# Patient Record
Sex: Female | Born: 1955 | Race: White | Hispanic: No | Marital: Married | Smoking: Never smoker
Health system: Southern US, Community
[De-identification: ages and names within clinical notes are randomized; demographics above are authoritative.]

## PROBLEM LIST (undated history)

## (undated) DIAGNOSIS — N951 Menopausal and female climacteric states: Secondary | ICD-10-CM

## (undated) DIAGNOSIS — J310 Chronic rhinitis: Secondary | ICD-10-CM

## (undated) DIAGNOSIS — G43909 Migraine, unspecified, not intractable, without status migrainosus: Secondary | ICD-10-CM

## (undated) DIAGNOSIS — T7840XA Allergy, unspecified, initial encounter: Secondary | ICD-10-CM

## (undated) DIAGNOSIS — E039 Hypothyroidism, unspecified: Secondary | ICD-10-CM

## (undated) DIAGNOSIS — F419 Anxiety disorder, unspecified: Secondary | ICD-10-CM

## (undated) DIAGNOSIS — Z0000 Encounter for general adult medical examination without abnormal findings: Secondary | ICD-10-CM

## (undated) DIAGNOSIS — M8580 Other specified disorders of bone density and structure, unspecified site: Secondary | ICD-10-CM

## (undated) DIAGNOSIS — R102 Pelvic and perineal pain: Secondary | ICD-10-CM

## (undated) DIAGNOSIS — M1990 Unspecified osteoarthritis, unspecified site: Secondary | ICD-10-CM

## (undated) DIAGNOSIS — B009 Herpesviral infection, unspecified: Secondary | ICD-10-CM

## (undated) DIAGNOSIS — G8929 Other chronic pain: Secondary | ICD-10-CM

## (undated) HISTORY — DX: Allergy, unspecified, initial encounter: T78.40XA

## (undated) HISTORY — DX: Pelvic and perineal pain: R10.2

## (undated) HISTORY — DX: Migraine, unspecified, not intractable, without status migrainosus: G43.909

## (undated) HISTORY — DX: Hypothyroidism, unspecified: E03.9

## (undated) HISTORY — DX: Other specified disorders of bone density and structure, unspecified site: M85.80

## (undated) HISTORY — DX: Herpesviral infection, unspecified: B00.9

## (undated) HISTORY — DX: Unspecified osteoarthritis, unspecified site: M19.90

## (undated) HISTORY — DX: Other chronic pain: G89.29

## (undated) HISTORY — DX: Menopausal and female climacteric states: N95.1

## (undated) HISTORY — DX: Anxiety disorder, unspecified: F41.9

## (undated) HISTORY — DX: Encounter for general adult medical examination without abnormal findings: Z00.00

## (undated) HISTORY — PX: PELVIC LAPAROSCOPY: SHX162

## (undated) HISTORY — DX: Chronic rhinitis: J31.0

---

## 1993-02-11 DIAGNOSIS — G8929 Other chronic pain: Secondary | ICD-10-CM

## 1993-02-11 HISTORY — DX: Other chronic pain: G89.29

## 1998-01-25 ENCOUNTER — Encounter: Payer: Self-pay | Admitting: Obstetrics and Gynecology

## 1998-01-25 ENCOUNTER — Ambulatory Visit (HOSPITAL_COMMUNITY): Admission: RE | Admit: 1998-01-25 | Discharge: 1998-01-25 | Payer: Self-pay | Admitting: Obstetrics and Gynecology

## 1999-03-06 ENCOUNTER — Encounter: Payer: Self-pay | Admitting: Obstetrics and Gynecology

## 1999-03-06 ENCOUNTER — Ambulatory Visit (HOSPITAL_COMMUNITY): Admission: RE | Admit: 1999-03-06 | Discharge: 1999-03-06 | Payer: Self-pay | Admitting: Obstetrics and Gynecology

## 1999-04-12 ENCOUNTER — Other Ambulatory Visit: Admission: RE | Admit: 1999-04-12 | Discharge: 1999-04-12 | Payer: Self-pay | Admitting: Obstetrics and Gynecology

## 2000-07-23 ENCOUNTER — Encounter: Payer: Self-pay | Admitting: Obstetrics and Gynecology

## 2000-07-23 ENCOUNTER — Ambulatory Visit (HOSPITAL_COMMUNITY): Admission: RE | Admit: 2000-07-23 | Discharge: 2000-07-23 | Payer: Self-pay | Admitting: Obstetrics and Gynecology

## 2000-11-10 ENCOUNTER — Encounter: Admission: RE | Admit: 2000-11-10 | Discharge: 2000-11-10 | Payer: Self-pay | Admitting: Family Medicine

## 2000-11-10 ENCOUNTER — Encounter: Payer: Self-pay | Admitting: Family Medicine

## 2001-10-23 ENCOUNTER — Encounter: Payer: Self-pay | Admitting: Obstetrics and Gynecology

## 2001-10-23 ENCOUNTER — Ambulatory Visit (HOSPITAL_COMMUNITY): Admission: RE | Admit: 2001-10-23 | Discharge: 2001-10-23 | Payer: Self-pay | Admitting: Obstetrics and Gynecology

## 2002-11-16 ENCOUNTER — Encounter: Payer: Self-pay | Admitting: Obstetrics and Gynecology

## 2002-11-16 ENCOUNTER — Ambulatory Visit (HOSPITAL_COMMUNITY): Admission: RE | Admit: 2002-11-16 | Discharge: 2002-11-16 | Payer: Self-pay | Admitting: Obstetrics and Gynecology

## 2003-02-02 ENCOUNTER — Other Ambulatory Visit: Admission: RE | Admit: 2003-02-02 | Discharge: 2003-02-02 | Payer: Self-pay | Admitting: Obstetrics and Gynecology

## 2004-02-15 ENCOUNTER — Other Ambulatory Visit: Admission: RE | Admit: 2004-02-15 | Discharge: 2004-02-15 | Payer: Self-pay | Admitting: Obstetrics and Gynecology

## 2004-07-12 ENCOUNTER — Ambulatory Visit: Payer: Self-pay | Admitting: Internal Medicine

## 2004-07-16 ENCOUNTER — Ambulatory Visit: Payer: Self-pay | Admitting: Cardiology

## 2004-07-17 ENCOUNTER — Ambulatory Visit (HOSPITAL_COMMUNITY): Admission: RE | Admit: 2004-07-17 | Discharge: 2004-07-17 | Payer: Self-pay | Admitting: Obstetrics and Gynecology

## 2004-12-18 ENCOUNTER — Ambulatory Visit: Payer: Self-pay | Admitting: Internal Medicine

## 2004-12-25 ENCOUNTER — Ambulatory Visit: Payer: Self-pay | Admitting: Gastroenterology

## 2005-02-26 ENCOUNTER — Ambulatory Visit: Payer: Self-pay | Admitting: Gastroenterology

## 2005-03-11 ENCOUNTER — Other Ambulatory Visit: Admission: RE | Admit: 2005-03-11 | Discharge: 2005-03-11 | Payer: Self-pay | Admitting: Obstetrics and Gynecology

## 2005-03-29 ENCOUNTER — Ambulatory Visit: Payer: Self-pay | Admitting: Internal Medicine

## 2005-06-19 ENCOUNTER — Ambulatory Visit: Payer: Self-pay | Admitting: Internal Medicine

## 2005-08-01 ENCOUNTER — Ambulatory Visit (HOSPITAL_COMMUNITY): Admission: RE | Admit: 2005-08-01 | Discharge: 2005-08-01 | Payer: Self-pay | Admitting: Obstetrics and Gynecology

## 2006-03-24 ENCOUNTER — Other Ambulatory Visit: Admission: RE | Admit: 2006-03-24 | Discharge: 2006-03-24 | Payer: Self-pay | Admitting: Obstetrics and Gynecology

## 2006-08-05 ENCOUNTER — Ambulatory Visit (HOSPITAL_COMMUNITY): Admission: RE | Admit: 2006-08-05 | Discharge: 2006-08-05 | Payer: Self-pay | Admitting: Obstetrics and Gynecology

## 2006-10-02 ENCOUNTER — Ambulatory Visit: Payer: Self-pay | Admitting: Internal Medicine

## 2007-04-13 ENCOUNTER — Other Ambulatory Visit: Admission: RE | Admit: 2007-04-13 | Discharge: 2007-04-13 | Payer: Self-pay | Admitting: Obstetrics and Gynecology

## 2007-05-21 ENCOUNTER — Encounter: Payer: Self-pay | Admitting: Internal Medicine

## 2007-08-10 ENCOUNTER — Ambulatory Visit (HOSPITAL_COMMUNITY): Admission: RE | Admit: 2007-08-10 | Discharge: 2007-08-10 | Payer: Self-pay | Admitting: Obstetrics and Gynecology

## 2007-11-20 ENCOUNTER — Telehealth (INDEPENDENT_AMBULATORY_CARE_PROVIDER_SITE_OTHER): Payer: Self-pay | Admitting: *Deleted

## 2007-11-20 ENCOUNTER — Ambulatory Visit: Payer: Self-pay | Admitting: Internal Medicine

## 2007-11-20 DIAGNOSIS — J310 Chronic rhinitis: Secondary | ICD-10-CM

## 2008-05-20 ENCOUNTER — Other Ambulatory Visit: Admission: RE | Admit: 2008-05-20 | Discharge: 2008-05-20 | Payer: Self-pay | Admitting: Obstetrics and Gynecology

## 2008-05-20 ENCOUNTER — Encounter: Payer: Self-pay | Admitting: Obstetrics and Gynecology

## 2008-05-20 ENCOUNTER — Ambulatory Visit: Payer: Self-pay | Admitting: Obstetrics and Gynecology

## 2008-08-26 ENCOUNTER — Ambulatory Visit (HOSPITAL_COMMUNITY): Admission: RE | Admit: 2008-08-26 | Discharge: 2008-08-26 | Payer: Self-pay | Admitting: Obstetrics and Gynecology

## 2008-11-08 ENCOUNTER — Encounter: Payer: Self-pay | Admitting: Internal Medicine

## 2009-02-24 ENCOUNTER — Ambulatory Visit: Payer: Self-pay | Admitting: Internal Medicine

## 2009-05-23 ENCOUNTER — Other Ambulatory Visit: Admission: RE | Admit: 2009-05-23 | Discharge: 2009-05-23 | Payer: Self-pay | Admitting: Obstetrics and Gynecology

## 2009-05-23 ENCOUNTER — Ambulatory Visit: Payer: Self-pay | Admitting: Obstetrics and Gynecology

## 2009-06-22 ENCOUNTER — Ambulatory Visit: Payer: Self-pay | Admitting: Obstetrics and Gynecology

## 2009-08-29 ENCOUNTER — Ambulatory Visit (HOSPITAL_COMMUNITY): Admission: RE | Admit: 2009-08-29 | Discharge: 2009-08-29 | Payer: Self-pay | Admitting: Obstetrics and Gynecology

## 2009-11-30 ENCOUNTER — Encounter: Payer: Self-pay | Admitting: Gastroenterology

## 2009-11-30 ENCOUNTER — Ambulatory Visit: Payer: Self-pay | Admitting: Internal Medicine

## 2009-12-01 ENCOUNTER — Telehealth (INDEPENDENT_AMBULATORY_CARE_PROVIDER_SITE_OTHER): Payer: Self-pay | Admitting: *Deleted

## 2009-12-29 ENCOUNTER — Encounter (INDEPENDENT_AMBULATORY_CARE_PROVIDER_SITE_OTHER): Payer: Self-pay | Admitting: *Deleted

## 2010-01-01 ENCOUNTER — Ambulatory Visit: Payer: Self-pay | Admitting: Gastroenterology

## 2010-01-15 ENCOUNTER — Ambulatory Visit: Payer: Self-pay | Admitting: Gastroenterology

## 2010-03-13 NOTE — Assessment & Plan Note (Signed)
Summary: Primary svc/ f/u chronic rhinitis   Primary Provider/Referring Provider:  Sherene Sires  CC:  Chronic rhinitis- improved. Marland Kitchen  History of Present Illness: 27 yowf never smoker with h/o chronic rhinitis and hypothyroidism.  November 20, 2007 cough x 2  weeks  in setting of recent acute onset URI  resolved.  February 24, 2009 Acute visit.  Pt c/o right side facial pressure and HA x 1 month. worse when supine  She has had some PND also and some dry cough.  She has noticed ringing in her right ear.  Year round daily nose running ever since arrived in [place] with allergy shots bad tol no help.Zpak should turn any disclored mucus to clear, if not call back Prednisone 10 4 each am x 2days, 2x2days, 1x2days and stop For pain, tramadol 50mg  1-2 every 4 hour  November 30, 2009 ov cc increase nasal congestion on nasonex 2 puffs each am with contingency to use afrin x 5 days first and add a second dose of nasonex.  working well until ran out.  Pt denies any significant sore throat, dysphagia, itching, sneezing,      fever, chills, sweats, unintended wt loss, pleuritic or exertional cp, hempoptysis, change in activity tolerance  orthopnea pnd or leg swelling. although nasonex worked well for congestion, still freq watery rhinitis    Current Medications (verified): 1)  Nasonex 50 Mcg/act Susp (Mometasone Furoate) .... 2 Sprays 1-2 Times Daily As Needed 2)  Synthroid 100 Mcg Tabs (Levothyroxine Sodium) .... Once Daily 3)  Valtrex 500 Mg Tabs (Valacyclovir Hcl) .... As Needed 4)  Mucinex Dm 30-600 Mg Xr12h-Tab (Dextromethorphan-Guaifenesin) .Marland Kitchen.. 1-2 Every 12 Hours As Needed 5)  Femhrt Low Dose 0.5-2.5 Mg-Mcg Tabs (Norethindrone-Eth Estradiol) .Marland Kitchen.. 1 Once Daily 6)  Advil Cold/sinus 30-200 Mg Tabs (Pseudoephedrine-Ibuprofen) .... As Directed As Needed 7)  Tramadol Hcl 50 Mg  Tabs (Tramadol Hcl) .... One To Two By Mouth Every 4-6 Hours If Needed For Cough- Ran Out 8)  Advil 200 Mg Tabs (Ibuprofen) .Marland Kitchen.. 1 Two  Times A Day As Needed 9)  Multivitamins  Tabs (Multiple Vitamin) .Marland Kitchen.. 1 Once Daily 10)  Calcium-Vitamin D 600-200 Mg-Unit Tabs (Calcium-Vitamin D) .Marland Kitchen.. 1 Once Daily  Allergies (verified): 1)  ! Lodine 2)  ! Truman Hayward  Past History:  Past Medical History: HYPOTHYROIDIMS CHRONIC RHINITIS    - Chronic rhinitis flyer (# 4th) given November 20, 2007  HEALTH MAINTENANCE ................................................Marland KitchenWert    -Td 09/1999 and February 24, 2009    - Pneumovax February 24, 2009     -  Colonsocopy referral  November 30, 2009  GYN CARE....................................................................Marland KitchenGotsagen  Vital Signs:  Patient profile:   55 year old female Weight:      130 pounds BMI:     23.11 O2 Sat:      100 % on Room air Temp:     98.4 degrees F oral Pulse rate:   79 / minute BP sitting:   114 / 78  (left arm)  Vitals Entered By: Vernie Murders (November 30, 2009 11:37 AM)  O2 Flow:  Room air  Physical Exam  Additional Exam:  wt 132 November 20, 2007  > 131 February 25, 2009 > 130 December 01, 2009  Ambulatory healthy appearing in no acute distress Afeb with normal vital signs HEENT: nl dentition,, and orophanx. Nl external ear canals without cough reflex/ mod nonspecific turbinate edema Neck without JVD/Nodes/TM/ no tenderness Lungs clear to A and P bilaterally without cough  on insp or exp maneuvers RRR no s3 or murmur or increase in P2 Abd soft and benign with nl excursion in the supine position. No bruits or organomegaly Ext warm without calf tenderness, cyanosis clubbing or edema      Impression & Recommendations:  Problem # 1:  CHRONIC RHINITIS (ICD-472.0)  Reviewed approp rx with Nasonex and afrin  try 1st gen H1 for drippy nose  Orders: Est. Patient Level III (29562)  Problem # 2:  PHYSICAL EXAMINATION (ICD-V70.0)  Needs cpx this year plus screening colonoscopy  Discussed in detail all the  indications, usual  risks and alternatives   relative to the benefits with patient who agrees to proceed with colonoscopy  Medications Added to Medication List This Visit: 1)  Tramadol Hcl 50 Mg Tabs (Tramadol hcl) .... One to two by mouth every 4-6 hours if needed for cough- ran out 2)  Advil 200 Mg Tabs (Ibuprofen) .Marland Kitchen.. 1 two times a day as needed 3)  Multivitamins Tabs (Multiple vitamin) .Marland Kitchen.. 1 once daily 4)  Calcium-vitamin D 600-200 Mg-unit Tabs (Calcium-vitamin d) .Marland Kitchen.. 1 once daily  Other Orders: Admin 1st Vaccine (13086) Flu Vaccine 55yrs + [phone]) Gastroenterology Referral (GI)  Patient Instructions: 1)  Return to office for CPX one month Gottsagen's visit to complete your physical  2)  Chortrimeton 4mg  one every 6 hours for drippy nose 3)  See Patient Care Coordinator before leaving for  colonoscopy 4)  Call with strength of the imitrex that you found effective for your migraines and we will refill it         Flu Vaccine Consent Questions     Do you have a history of severe allergic reactions to this vaccine? no    Any prior history of allergic reactions to egg and/or gelatin? no    Do you have a sensitivity to the preservative Thimersol? no    Do you have a past history of Guillan-Barre Syndrome? no    Do you currently have an acute febrile illness? no    Have you ever had a severe reaction to latex? no    Vaccine information given and explained to patient? yes    Are you currently pregnant? no    Lot Number:[id number]   Exp Date:08/11/2010   Site Given  Left Deltoid IMlu1   Mindy Silva  November 30, 2009 12:07 PM

## 2010-03-13 NOTE — Assessment & Plan Note (Signed)
Summary: Primary svc/ acute flare of chronic rhinitis   Primary Provider/Referring Provider:  Sherene Sires  CC:  Acute visit.  Pt c/o right side facial pressure and HA x 1 month.  She has had some PND also and some dry cough.  She has noticed ringing in her right ear.Marland Kitchen  History of Present Illness: 10 yowf never smoker with h/o chronic rhinitis and hypothyroidism.  November 20, 2007 cough x 2  weeks  in setting of recent acute onset URI  resolved.  February 24, 2009 Acute visit.  Pt c/o right side facial pressure and HA x 1 month. worse when supine  She has had some PND also and some dry cough.  She has noticed ringing in her right ear.  Year round daily nose running ever since arrived in [place] with allergy shots bad tol no help.   Pt denies any significant sob/ sore throat, dysphagia, itching, sneezing,  fever, chills, sweats, unintended wt loss, pleuritic or exertional cp, hempoptysis, change in activity tolerance  orthopnea pnd or leg swelling.  Pt also denies any obvious fluctuation in symptoms with weather or environmental change or other alleviating or aggravating factors.     Following cr instruction sheet except forgot about advil cold and sinus until this am, not better yet. no nausea or viz cos or earache.    Current Medications (verified): 1)  Nasonex 50 Mcg/act Susp (Mometasone Furoate) .... 2 Sprays 1-2 Times Daily As Needed 2)  Synthroid 100 Mcg Tabs (Levothyroxine Sodium) .... Once Daily 3)  Valtrex 500 Mg Tabs (Valacyclovir Hcl) .... As Needed 4)  Mucinex Dm 30-600 Mg Xr12h-Tab (Dextromethorphan-Guaifenesin) .Marland Kitchen.. 1-2 Every 12 Hours As Needed 5)  Femhrt Low Dose 0.5-2.5 Mg-Mcg Tabs (Norethindrone-Eth Estradiol) .Marland Kitchen.. 1 Once Daily 6)  Advil Cold/sinus 30-200 Mg Tabs (Pseudoephedrine-Ibuprofen) .... As Directed As Needed  Allergies (verified): 1)  ! Lodine 2)  ! Truman Hayward  Past History:  Past Medical History: HYPOTHYROIDIMS CHRONIC RHINITIS    - Chronic rhinitis flyer (# 4th)  given November 20, 2007  HEALTH MAINTENANCE ...............................................Marland KitchenWert    -Td 09/1999 and February 24, 2009    - Pneumovax February 24, 2009  GYN CARE....................................................................Marland KitchenGotsagen  Vital Signs:  Patient profile:   55 year old female Height:      63 inches Weight:      131.13 pounds BMI:     23.31 O2 Sat:      99 % on Room air Temp:     97.9 degrees F oral Pulse rate:   78 / minute BP sitting:   90 / 60  (left arm)  Vitals Entered By: Vernie Murders (February 24, 2009 9:37 AM)  O2 Flow:  Room air  Physical Exam  Additional Exam:  wt 132 November 20, 2007  > 131 February 25, 2009  Ambulatory healthy appearing in no acute distress Afeb with normal vital signs HEENT: nl dentition,, and orophanx. Nl external ear canals without cough reflex/ mod nonspecific turbinate edema Neck without JVD/Nodes/TM/ no tenderness Lungs clear to A and P bilaterally without cough on insp or exp maneuvers RRR no s3 or murmur or increase in P2 Abd soft and benign with nl excursion in the supine position. No bruits or organomegaly Ext warm without calf tenderness, cyanosis clubbing or edema s     Impression & Recommendations:  Problem # 1:  CHRONIC RHINITIS (ICD-472.0)  acute flare with possible sinusitis.  already has chronic rhinitis flyer just needs to follow all contingency  instructions including use of as needed advil cold and sinus.  See instructions for specific recommendations    Sinus ct next step if not improving  Orders: Est. Patient Level III ([address])  Medications Added to Medication List This Visit: 1)  Advil Cold/sinus 30-200 Mg Tabs (Pseudoephedrine-ibuprofen) .... As directed as needed 2)  Zithromax Z-pak 250 Mg Tabs (Azithromycin) .... Take as directed 3)  Prednisone 10 Mg Tabs (Prednisone) .... 4 each am x 2days, 2x2days, 1x2days and stop 4)  Tramadol Hcl 50 Mg Tabs (Tramadol hcl) .... One to two by mouth  every 4-6 hours if needed for cough  Other Orders: TD Toxoids IM 7 YR + (60454) Admin 1st Vaccine (09811) Pneumococcal Vaccine (91478) Admin of Any Addtl Vaccine (29562)  Patient Instructions: 1)  I emphasized that nasal steroids have no immediate benefit in terms of improving symptoms.  To help them reached the target tissue, the patient should use Afrin two puffs every 12 hours applied one min before using the nasal steroids.  Afrin should be stopped after no more than 5 days.  If the symptoms worsen, Afrin can be restarted after 5 days off of therapy to prevent rebound congestion from overuse of Afrin.  I also emphasized that in no way are nasal steroids a concern in terms of "addiction". 2)   Follow the flyer 3)  Zpak should turn any disclored mucus to clear, if not call back 4)  Prednisone 10 4 each am x 2days, 2x2days, 1x2days and stop 5)  For pain, tramadol 50mg  1-2 every 4 hours 6)  If not improving call [phone] ask for [facility] to schedule CT Prescriptions: TRAMADOL HCL 50 MG  TABS (TRAMADOL HCL) One to two by mouth every 4-6 hours if needed for cough  #40 x 0   Entered and Authorized by:   Nyoka Cowden MD   Signed by:   Nyoka Cowden MD on 02/24/2009   Method used:   Electron[facility] to        [facility]  [address] [phone][facility] (retail)       [address] [place], [place]  [address]       Ph: [phone] or [phone]       Fax: [phone]   RxID:   [id number] PREDNISONE 10 MG  TABS (PREDNISONE) 4 each am x 2days, 2x2days, 1x2days and stop  #14 x 0   Entered and Authorized by:   Nyoka Cowden MD   Signed by:   Nyoka Cowden MD on 02/24/2009   Method used:   [facility] to        [facility]  [address] [phone][facility] (retail)       [address] [place], [place]  [address]       Ph: [phone] or [phone]       Fax: [phone]   RxID:   [id number] ZITHROMAX Z-PAK 250 MG TABS (AZITHROMYCIN) take as directed  #1 x 0   Entered and Authorized by:    Nyoka Cowden MD   Signed by:   Nyoka Cowden MD on 02/24/2009   Method used:   [facility]        [facility]  [address] [phone][facility]       [address]  [place], [place]  [address]       Ph: [phone] or [phone]       Fax: [phone]   RxID:   [id number]    Immunizations Administered:  Tetanus Vaccine:    Vaccine Type: Td    Site: right deltoid    Mfr: Sanofi Pasteur    Dose: 0.5 ml    Given by: Vernie Murders    Exp. Date: 05/25/2010    Lot #: [id number]    VIS given: 12/30/06 version given February 24, 2009.  Pneumonia Vaccine:    Vaccine Type: Pneumovax    Site: left deltoid    Mfr: Merck    Dose: 0.5 ml    Route: IM    Given by: Vernie Murders    Exp. Date: 03/09/2010    Lot #: [id number]    VIS given: 09/09/95 version given February 24, 2009.

## 2010-03-13 NOTE — Letter (Signed)
Summary: [facility] Instructions  [facility]  [address] [place], [place] [address]   Phone: [phone]  Fax: [phone]       Heather Chambers    [birth year]    MRN: [id number]        Procedure Day [name]:  Heather Chambers  01/15/10     Arrival Time:  9:00AM     Procedure Time:  10:00AM     Location of Procedure:                    Heather Chambers _  [name] (4th Floor)                      PREPARATION FOR COLONOSCOPY WITH MOVIPREP   Starting 5 days prior to your procedure 01/10/10 do not eat nuts, seeds, popcorn, corn, beans, peas,  salads, or any raw vegetables.  Do not take any fiber supplements (e.g. Metamucil, Citrucel, and Benefiber).  THE DAY BEFORE YOUR PROCEDURE         DATE: 01/14/10   DAY: SUNDAY  1.  Drink clear liquids the entire day-NO SOLID FOOD  2.  Do not drink anything colored red or purple.  Avoid juices with pulp.  No orange juice.  3.  Drink at least 64 oz. (8 glasses) of fluid/clear liquids during the day to prevent dehydration and help the prep work efficiently.  CLEAR LIQUIDS INCLUDE: Water Jello Ice Popsicles Tea (sugar ok, no milk/cream) Powdered fruit flavored drinks Coffee (sugar ok, no milk/cream) Gatorade Juice: apple, white grape, white cranberry  Lemonade Clear bullion, consomm, broth Carbonated beverages (any kind) Strained chicken noodle soup Hard Candy                             4.  In the morning, mix first dose of MoviPrep solution:    Empty 1 Pouch A and 1 Pouch B into the disposable container    Add lukewarm drinking water to the top line of the container. Mix to dissolve    Refrigerate (mixed solution should be used within 24 hrs)  5.  Begin drinking the prep at 5:00 p.m. The MoviPrep container is divided by 4 marks.   Every 15 minutes drink the solution down to the next mark (approximately 8 oz) until the full liter is complete.   6.  Follow completed prep with 16 oz of clear liquid of your choice (Nothing red  or purple).  Continue to drink clear liquids until bedtime.  7.  Before going to bed, mix second dose of MoviPrep solution:    Empty 1 Pouch A and 1 Pouch B into the disposable container    Add lukewarm drinking water to the top line of the container. Mix to dissolve    Refrigerate  THE DAY OF YOUR PROCEDURE      DATE: 01/15/10  DAY: MONDAY  Beginning at 5:00AM (5 hours before procedure):         1. Every 15 minutes, drink the solution down to the next mark (approx 8 oz) until the full liter is complete.  2. Follow completed prep with 16 oz. of clear liquid of your choice.    3. You may drink clear liquids until 8:00AM (2 HOURS BEFORE PROCEDURE).   MEDICATION INSTRUCTIONS  Unless otherwise instructed, you should take regular prescription medications with a small sip of water   as early as possible the morning of  your procedure.           OTHER INSTRUCTIONS  You will need a responsible adult at least 53 years of age to accompany you and drive you home.   This person must remain in the waiting room during your procedure.  Wear loose fitting clothing that is easily removed.  Leave jewelry and other valuables at home.  However, you may wish to bring a book to read or  an iPod/MP3 player to listen to music as you wait for your procedure to start.  Remove all body piercing jewelry and leave at home.  Total time from sign-in until discharge is approximately 2-3 hours.  You should go home directly after your procedure and rest.  You can resume normal activities the  day after your procedure.  The day of your procedure you should not:   Drive   Make legal decisions   Operate machinery   Drink alcohol   Return to work  You will receive specific instructions about eating, activities and medications before you leave.    The above instructions have been reviewed and explained to me by   [name]  January 01, 2010 1:12 PM   I fully understand and can  verbalize these instructions _____________________________ Date _________

## 2010-03-13 NOTE — Miscellaneous (Signed)
Summary: previsit prep/rm  Clinical Lists Changes  Medications: Added new medication of MOVIPREP 100 GM  SOLR (PEG-KCL-NACL-NASULF-NA ASC-C) As per prep instructions. - Signed Rx of MOVIPREP 100 GM  SOLR (PEG-KCL-NACL-NASULF-NA ASC-C) As per prep instructions.;  #1 x 0;  Signed;  Entered by: [name];  Authorized by: Rachael Fee MD;  Method used: [facility]  [phone] [address] [place], [place], [place]  [address], Ph: [phone] or [phone], Fax: [phone] Observations: Added new observation of ALLERGY REV: Done (01/01/2010 12:56)    Prescriptions: MOVIPREP 100 GM  SOLR (PEG-KCL-NACL-NASULF-NA ASC-C) As per prep instructions.  #1 x 0   Entered by:   [name]   Authorized by:   Rachael Fee MD   Signed by:   [name] on 01/01/2010   Method used:   [facility]        [facility]  [address] [phone][facility] (retail)       [address]       [place], [place]  [address]       Ph: [phone] or [phone]       Fax: [phone]   RxID:   [id number]

## 2010-03-13 NOTE — Letter (Signed)
Summary: Pre Visit Letter Revised  [facility]  [address] [place], [place] [address]   Phone: [phone]  Fax: [phone]        11/30/2009 MRN: [id number]  Heather Chambers [address] [place], [place]  [address]             Procedure Date:  12-5 at 10am   Welcome to the Gastroenterology Division at [facility].    You are scheduled to see a nurse for your pre-procedure visit on 01-01-10 at 1pm on the 3rd floor at [facility], [address]  We ask that you try to arrive at our office 15 minutes prior to your appointment time to allow for check-in.  Please take a minute to review the attached form.  If you answer "Yes" to one or more of the questions on the first page, we ask that you call the person listed at your earliest opportunity.  If you answer "No" to all of the questions, please complete the rest of the form and bring it to your appointment.    Your nurse visit will consist of discussing your medical and surgical history, your immediate family medical history, and your medications.   If you are unable to list all of your medications on the form, please bring the medication bottles to your appointment and we will list them.  We will need to be aware of both prescribed and over the counter drugs.  We will need to know exact dosage information as well.    Please be prepared to read and sign documents such as consent forms, a financial agreement, and acknowledgement forms.  If necessary, and with your consent, a friend or relative is welcome to sit-in on the nurse visit with you.  Please bring your insurance card so that we may make a copy of it.  If your insurance requires a referral to see a specialist, please bring your referral form from your primary care physician.  No co-pay is required for this nurse visit.     If you cannot keep your appointment, please call [phone] to cancel or reschedule prior to your appointment date.  This allows  [place] the opportunity to schedule an appointment for another patient in need of care.    Thank you for choosing [facility] for your medical needs.  We appreciate the opportunity to care for you.  Please visit [place] at our website  to learn more about our practice.  Sincerely, The Gastroenterology Division

## 2010-03-13 NOTE — Procedures (Signed)
Summary: Colonoscopy  Patient: [name] Note: All result statuses are Final unless otherwise noted.  Tests: (1) Colonoscopy (COL)   COL Colonoscopy           DONE     [facility]     [address]     [place], [place]  [address]           COLONOSCOPY PROCEDURE REPORT           PATIENT:  Heather Chambers, Heather Chambers  MR#:  [id number]     BIRTHDATE:  1955/04/08, 54 yrs. old  GENDER:  female     ENDOSCOPIST:  Rachael Fee, MD     REF. BY:  Charlaine Dalton. Sherene Sires, M.D.     PROCEDURE DATE:  01/15/2010     PROCEDURE:  Diagnostic Colonoscopy     ASA CLASS:  Class II     INDICATIONS:  Routine Risk Screening     MEDICATIONS:   Fentanyl 75 mcg IV, Versed 10 mg IV           DESCRIPTION OF PROCEDURE:   After the risks benefits and     alternatives of the procedure were thoroughly explained, informed     consent was obtained.  Digital rectal exam was performed and     revealed no rectal masses.   The LB [id number]-[id number] endoscope     was introduced through the anus and advanced to the cecum, which     was identified by both the appendix and ileocecal valve, without     limitations.  The quality of the prep was good, using MoviPrep.     The instrument was then slowly withdrawn as the colon was fully     examined.     <<PROCEDUREIMAGES>>           FINDINGS:  A normal appearing cecum, ileocecal valve, and     appendiceal orifice were identified. The ascending, hepatic     flexure, transverse, splenic flexure, descending, sigmoid colon,     and rectum appeared unremarkable (see image1, image2, and image3).     Retroflexed views in the rectum revealed no abnormalities.    The     scope was then withdrawn from the patient and the procedure     completed.           COMPLICATIONS:  None           ENDOSCOPIC IMPRESSION:     1) Normal colon     2) No polyps or cancers           RECOMMENDATIONS:     1) You should continue to follow colorectal cancer screening     guidelines for "routine risk"  patients with a repeat colonoscopy     in 10 years. There is no need for FOBT (stool) testing for at     least 5 years.           REPEAT EXAM:  10 years           ______________________________     Rachael Fee, MD           n.     eSIGNED:   Rachael Fee at 01/15/2010 09:58 AM           Christy Gentles, [phone]  Note: An exclamation mark (!) indicates a result that was not dispersed into the flowsheet. Document Creation Date: 01/15/2010 9:58 AM _______________________________________________________________________  (1) Order result status: Final Collection or observation date-time:  01/15/2010 09:54 Requested date-time:  Receipt date-time:  Reported date-time:  Referring Physician:   Ordering Physician: Rob Bunting [phone]) Specimen Source:  Source: Launa Grill Order Number: [id number] Lab site:   Appended Document: Colonoscopy    Clinical Lists Changes  Observations: Added new observation of COLONNXTDUE: 01/2020 (01/15/2010 15:06)

## 2010-03-13 NOTE — Progress Notes (Signed)
Summary: ? imitrex rx  Phone Note Call from Patient Call back at Work Phone [phone]   Caller: Patient Call For: wert Reason for Call: Refill Medication Summary of Call: Patient saw Dr. Sherene Sires yesterday and she was supposed to call with strength of imitrex so we could refill it.  The strength is 100mg --[facility]. Initial call taken by: Lehman Prom,  December 01, 2009 8:42 AM  Follow-up for Phone Call        Hey TP, MW advised this pt to call with strength of imitrex that works best for her.  She calls stating she needs 100 mg tabs- can you please advise how this is to be taken and a quantity to give? Thanks! Follow-up by: Vernie Murders,  December 01, 2009 9:15 AM  Additional Follow-up for Phone Call Additional follow up Details #1::        Imitrex 100mg  at onset of headache as needed  #10  3 refills  Additional Follow-up by: Rubye Oaks NP,  December 01, 2009 9:40 AM    Additional Follow-up for Phone Call Additional follow up Details #2::    Spoke with pt and notified that rx was sent to pharm.   Follow-up by: Vernie Murders,  December 01, 2009 9:49 AM  New/Updated Medications: IMITREX 100 MG TABS (SUMATRIPTAN SUCCINATE) 1 at onset of head ache as needed Prescriptions: IMITREX 100 MG TABS (SUMATRIPTAN SUCCINATE) 1 at onset of head ache as needed  #10 x 3   Entered by:   Vernie Murders   Authorized by:   Nyoka Cowden MD   Signed by:   Vernie Murders on 12/01/2009   Method used:   [facility]  [address] [phone]       [address] [place], [place]  [address]       Ph: [phone] or [phone]       Fax: [phone]   RxID:   [id number]

## 2010-05-31 ENCOUNTER — Encounter: Payer: Self-pay | Admitting: Obstetrics and Gynecology

## 2010-06-26 NOTE — Assessment & Plan Note (Signed)
[facility]                             PULMONARY OFFICE NOTE   Heather Chambers, Heather Chambers                          MRN:          [id number]  DATE:10/02/2006                            DOB:          Jan 16, 1956    HISTORY OF PRESENT ILLNESS:  The patient is a pleasant 55 year old white  female patient of Dr. Thurston Hole who has a known history of seasonal  rhinitis that presents for an annual office visit.  The patient was last  seen in the office in May of 2007.  She reports that she has been doing  fairly well.  She continues to have intermittent episodes in which she  gets congestion or postnasal drip symptoms.  She is maintained on a  regimen of Astelin nasal spray two puffs twice daily and Nasonex two  puffs in a.m. with Zyrtec on an as needed basis.  The patient denies any  purulent sputum, fever, chest pain, orthopnea, PND, wheezing, or cough.   PAST MEDICAL HISTORY:  Past medical history is reviewed.   CURRENT MEDICATIONS:  Current medications reviewed.   PHYSICAL EXAMINATION:  The  patient is a pleasant female in no acute  distress.  She is afebrile with stable vital signs.  O2 saturation is 97% on room  air.  HEENT:  Nasal mucosa is slightly pale.  Nontender sinuses.  Conjunctivae  noninjected.  Posterior pharynx clear.  NECK:  Supple without cervical adenopathy.  No JVD.  LUNGS:  Lung sounds are clear.  CARDIAC:  Regular rate.  ABDOMEN:  Soft and nontender.  EXTREMITIES:  Warm without any edema.   IMPRESSION:  Chronic rhinitis.  The patient is recommended to continue  on Nasonex and Astelin nasal spray.  She is to add in saline nasal spray  as needed.  May use Zyrtec as needed.  The patient will return back to  the office with Dr. Sherene Sires in six months or sooner if needed.      Rubye Oaks, NP  Electronically Signed      Charlaine Dalton. Sherene Sires, MD, [facility]  Electronically Signed   TP/MedQ  DD: 10/03/2006  DT: 10/04/2006  Job #: [id number]

## 2010-07-12 ENCOUNTER — Other Ambulatory Visit (HOSPITAL_COMMUNITY)
Admission: RE | Admit: 2010-07-12 | Discharge: 2010-07-12 | Disposition: A | Discharge status: Home / Self Care | Payer: BC Managed Care – PPO | Source: Ambulatory Visit | Attending: Obstetrics and Gynecology | Admitting: Obstetrics and Gynecology

## 2010-07-12 ENCOUNTER — Other Ambulatory Visit: Payer: Self-pay | Admitting: Obstetrics and Gynecology

## 2010-07-12 ENCOUNTER — Encounter (INDEPENDENT_AMBULATORY_CARE_PROVIDER_SITE_OTHER): Payer: BC Managed Care – PPO | Admitting: Obstetrics and Gynecology

## 2010-07-12 DIAGNOSIS — Z13220 Encounter for screening for lipoid disorders: Secondary | ICD-10-CM

## 2010-07-12 DIAGNOSIS — E039 Hypothyroidism, unspecified: Secondary | ICD-10-CM

## 2010-07-12 DIAGNOSIS — Z124 Encounter for screening for malignant neoplasm of cervix: Secondary | ICD-10-CM | POA: Insufficient documentation

## 2010-07-12 DIAGNOSIS — Z01419 Encounter for gynecological examination (general) (routine) without abnormal findings: Secondary | ICD-10-CM

## 2010-07-12 DIAGNOSIS — R823 Hemoglobinuria: Secondary | ICD-10-CM

## 2010-08-28 ENCOUNTER — Other Ambulatory Visit (INDEPENDENT_AMBULATORY_CARE_PROVIDER_SITE_OTHER): Payer: BC Managed Care – PPO

## 2010-08-28 DIAGNOSIS — N951 Menopausal and female climacteric states: Secondary | ICD-10-CM

## 2010-09-03 ENCOUNTER — Other Ambulatory Visit: Payer: Self-pay | Admitting: *Deleted

## 2010-09-03 ENCOUNTER — Other Ambulatory Visit: Payer: Self-pay | Admitting: Internal Medicine

## 2010-09-03 MED ORDER — MOMETASONE FUROATE 50 MCG/ACT NA SUSP: NASAL | Status: DC

## 2010-09-14 ENCOUNTER — Other Ambulatory Visit: Payer: Self-pay | Admitting: Internal Medicine

## 2010-09-17 ENCOUNTER — Other Ambulatory Visit: Payer: BC Managed Care – PPO

## 2010-09-22 ENCOUNTER — Other Ambulatory Visit: Payer: Self-pay | Admitting: Obstetrics and Gynecology

## 2010-09-24 ENCOUNTER — Other Ambulatory Visit: Payer: Self-pay | Admitting: Obstetrics and Gynecology

## 2010-09-24 DIAGNOSIS — Z1231 Encounter for screening mammogram for malignant neoplasm of breast: Secondary | ICD-10-CM

## 2010-09-25 ENCOUNTER — Other Ambulatory Visit: Payer: Self-pay | Admitting: Internal Medicine

## 2010-10-05 ENCOUNTER — Ambulatory Visit (HOSPITAL_COMMUNITY): Payer: BC Managed Care – PPO

## 2010-10-05 ENCOUNTER — Other Ambulatory Visit: Payer: Self-pay | Admitting: Internal Medicine

## 2010-10-16 ENCOUNTER — Encounter: Payer: Self-pay | Admitting: *Deleted

## 2010-10-16 ENCOUNTER — Encounter: Payer: Self-pay | Admitting: Internal Medicine

## 2010-10-16 ENCOUNTER — Ambulatory Visit (HOSPITAL_COMMUNITY)
Admission: RE | Admit: 2010-10-16 | Discharge: 2010-10-16 | Disposition: A | Discharge status: Home / Self Care | Payer: BC Managed Care – PPO | Source: Ambulatory Visit | Attending: Obstetrics and Gynecology | Admitting: Obstetrics and Gynecology

## 2010-10-16 DIAGNOSIS — Z1231 Encounter for screening mammogram for malignant neoplasm of breast: Secondary | ICD-10-CM | POA: Insufficient documentation

## 2010-10-17 ENCOUNTER — Ambulatory Visit (INDEPENDENT_AMBULATORY_CARE_PROVIDER_SITE_OTHER): Payer: BC Managed Care – PPO | Admitting: Internal Medicine

## 2010-10-17 ENCOUNTER — Encounter: Payer: Self-pay | Admitting: Internal Medicine

## 2010-10-17 DIAGNOSIS — J310 Chronic rhinitis: Secondary | ICD-10-CM

## 2010-10-17 DIAGNOSIS — R51 Headache: Secondary | ICD-10-CM

## 2010-10-17 DIAGNOSIS — E039 Hypothyroidism, unspecified: Secondary | ICD-10-CM

## 2010-10-17 MED ORDER — SUMATRIPTAN SUCCINATE 100 MG PO TABS: 100.0000 mg | ORAL_TABLET | ORAL | Status: DC | PRN

## 2010-10-17 NOTE — Patient Instructions (Signed)
Call if you'd like referral to headache specialist

## 2010-10-17 NOTE — Progress Notes (Signed)
Subjective:     Patient ID: Heather Chambers, female   DOB: 01/03/1956, 55 y.o.   MRN: [id number]  HPI  52 yowf never smoker with h/o chronic rhinitis and hypothyroidism and chronic/ recurrent migraines  February 24, 2009 Acute visit. Pt c/o right side facial pressure and HA x 1 month. worse when supine She has had some PND also and some dry cough. She has noticed ringing in her right ear. Year round daily nose running ever since arrived in [place] with allergy shots bad tol no help.Zpak should turn any disclored mucus to clear, if not call back  Prednisone 10 4 each am x 2days, 2x2days, 1x2days and stop  For pain, tramadol 50mg  1-2 every 4 hour   10/17/2010 f/u ov/Wert cc recurrent ha x 6 months 3/4 weeks, declines referral back to [place].  No aura but otherwise a typical migrain pattern for her, no neuro symptoms, pos photophia and nausea, responds the best to imitrex.   Pt denies any significant sore throat, dysphagia, itching, sneezing,  nasal congestion or excess/ purulent secretions,  fever, chills, sweats, unintended wt loss, pleuritic or exertional cp, hempoptysis, orthopnea pnd or leg swelling.  Also denies presyncope, palpitations, heartburn, abdominal pain, nausea, vomiting, diarrhea  or change in bowel or urinary habits, dysuria,hematuria,  rash, arthralgias, visual complaints, numbness weakness or ataxia.  Also denies any obvious fluctuation of symptoms with weather or environmental changes or other aggravating or alleviating factors.     Past Medical History:  HYPOTHYROIDIMS  CHRONIC RHINITIS  - Chronic rhinitis flyer (# 4th) given November 20, 2007  HEALTH MAINTENANCE ................................................Marland KitchenWert  - February 24, 2009  - Pneumovax February 24, 2009  - Colonsocopy referral November 30, 2009  GYN CARE....................................................................Marland KitchenGotsagen    Review of Systems     Objective:   Physical Exam wt 132 November 20, 2007 > 131  February 25, 2009 > 130 December 01, 2009 > 10/17/2010  133  Ambulatory healthy appearing in no acute distress  Afeb with normal vital signs  HEENT: nl dentition,, and orophanx. Nl external ear canals without cough reflex/ mod nonspecific turbinate edema  Neck without JVD/Nodes/TM/ no tenderness  Lungs clear to A and P bilaterally without cough on insp or exp maneuvers  RRR no s3 or murmur or increase in P2  Abd soft and benign with nl excursion in the supine position. No bruits or organomegaly  Ext warm without calf tenderness, cyanosis clubbing or edema   Neuro: alert approp, nl gait, no deficits Assessment:         Plan:

## 2010-10-18 ENCOUNTER — Telehealth: Payer: Self-pay | Admitting: Internal Medicine

## 2010-10-18 ENCOUNTER — Encounter: Payer: Self-pay | Admitting: Internal Medicine

## 2010-10-18 DIAGNOSIS — G8929 Other chronic pain: Secondary | ICD-10-CM | POA: Insufficient documentation

## 2010-10-18 DIAGNOSIS — E039 Hypothyroidism, unspecified: Secondary | ICD-10-CM | POA: Insufficient documentation

## 2010-10-18 MED ORDER — SUMATRIPTAN SUCCINATE 100 MG PO TABS: 100.0000 mg | ORAL_TABLET | ORAL | Status: DC | PRN

## 2010-10-18 NOTE — Assessment & Plan Note (Signed)
Adequate control on present rx, reviewed  

## 2010-10-18 NOTE — Assessment & Plan Note (Signed)
Adequate control on present rx, reviewed f/u by GYN  Explained hrt may interfere with tft's x for tsh which is the gold standard for adequate replacement rx

## 2010-10-18 NOTE — Assessment & Plan Note (Signed)
Appear to be uncomplicated migraines, as long as no neuro symptoms, ok to use imitrex o/w refer back to neuro ? [place]?

## 2010-10-18 NOTE — Telephone Encounter (Signed)
MW sent rx by accident to [facility]. I have called and cancelled this rx and [facility] [place]. Spoke with pt and notified that this was done.

## 2010-11-08 ENCOUNTER — Telehealth: Payer: Self-pay | Admitting: Internal Medicine

## 2010-11-08 NOTE — Telephone Encounter (Signed)
[id number] 

## 2010-11-08 NOTE — Telephone Encounter (Signed)
Pt returning call can be reached at [phone].Raylene Everts

## 2010-11-08 NOTE — Telephone Encounter (Signed)
Called, spoke with pt.  She has a question regarding how the office visit was billed to her insurance because her insurance did not pay for the visit.  Stacy B. Handles these questions.  I explained to pt that the nurses do not have access to this information.  Stacy handles the insurance but is off until Monday.  Advised pt I will leave Kennyth Arnold a message to call her regarding this on Monday at [phone].  She is ok with this and would like Stacy to leave a call back number if she does not answer as she will be in meetings on Monday.  I will print this message and place on Stacy's desk to address on Monday and will also send message to Raliegh Scarlet to make sure Kennyth Arnold does receive it.

## 2010-11-13 NOTE — Telephone Encounter (Signed)
Per Kennyth Arnold B, she called pt this morning regarding this and answered pt's questions.  Will sign off on phone message.

## 2011-02-13 ENCOUNTER — Telehealth: Payer: Self-pay | Admitting: Internal Medicine

## 2011-02-13 MED ORDER — LEVOFLOXACIN 750 MG PO TABS: 750.0000 mg | ORAL_TABLET | Freq: Every day | ORAL | Status: AC

## 2011-02-13 NOTE — Telephone Encounter (Signed)
I called and advised pt of kc recs. Rx's have been sent to pharmacy. Pt needed nothing further

## 2011-02-13 NOTE — Telephone Encounter (Signed)
Pt requests to be reached at [phone] (her cell) because she is leaving work.  Antionette Fairy

## 2011-02-13 NOTE — Telephone Encounter (Signed)
I spoke with pt and she c/o sinus congestion, sinus pressure, sinus headache, dry cough from PND x 2 weeks. Pt states it is worse at night. She has tried afrin w/ nasonex x 5 days and stopped, as well as advil cold and sinus w/o relief. Pt would like something called in to help. Pt states ZPak normally does not help. Since MW is not in will forward to doc of the day Dr. Shelle Iron for recs. Please advise thanks  Allergies  Allergen Reactions  . Cefdinir   . Etodolac     [facility]

## 2011-02-13 NOTE — Telephone Encounter (Signed)
lmomtcb x1 

## 2011-02-13 NOTE — Telephone Encounter (Signed)
Ok to call in:  levaquin 750mg  one a day for 7 days neilmed sinus rinses am and pm for next one week Chlorpheniramine 8mg  at bedtime and q6h prn for postnasal drip.  Needs ov if not getting better.

## 2011-03-04 ENCOUNTER — Other Ambulatory Visit: Payer: Self-pay | Admitting: *Deleted

## 2011-03-04 MED ORDER — ESTRADIOL 0.06 MG/24HR TD PTWK: 1.0000 | MEDICATED_PATCH | TRANSDERMAL | Status: DC

## 2011-03-18 ENCOUNTER — Other Ambulatory Visit: Payer: Self-pay | Admitting: Internal Medicine

## 2011-03-26 ENCOUNTER — Telehealth: Payer: Self-pay | Admitting: *Deleted

## 2011-03-26 NOTE — Telephone Encounter (Signed)
pT LEFT A MESSAGE THAT SHE HAD HAD 2 EPISODES OF PMB. I ADVISED OV TO EVALUATE. LEFT MESSAGE TO CALL BACK TO SCHEDULE AN APPT

## 2011-03-27 ENCOUNTER — Ambulatory Visit (INDEPENDENT_AMBULATORY_CARE_PROVIDER_SITE_OTHER): Payer: BC Managed Care – PPO | Admitting: Obstetrics and Gynecology

## 2011-03-27 DIAGNOSIS — N950 Postmenopausal bleeding: Secondary | ICD-10-CM

## 2011-03-27 DIAGNOSIS — N951 Menopausal and female climacteric states: Secondary | ICD-10-CM | POA: Insufficient documentation

## 2011-03-27 NOTE — Patient Instructions (Signed)
Schedule ultrasound

## 2011-03-27 NOTE — Progress Notes (Signed)
Patient came in today with 2 episodes of postmenopausal bleeding in the past 3 weeks. She remains on the same dose of HRT she has been on for the past year. She has not had any postmenopausal bleeding in many years.  Pelvic exam: External within normal limits. BUS within normal limits. Vaginal exam within normal limits. Cervix is clean without lesions. Uterus is normal size and shape. Adnexa failed to reveal masses. Rectovaginal examination is confirmatory and without masses. Patient is having modest bleeding from her cervical os.  Assessment: Postmenopausal bleeding on HRT, inappropriate  Plan: Endometrial biopsy done. SIH scheduled.

## 2011-04-01 ENCOUNTER — Ambulatory Visit (INDEPENDENT_AMBULATORY_CARE_PROVIDER_SITE_OTHER): Payer: BC Managed Care – PPO

## 2011-04-01 ENCOUNTER — Ambulatory Visit (INDEPENDENT_AMBULATORY_CARE_PROVIDER_SITE_OTHER): Payer: BC Managed Care – PPO | Admitting: Obstetrics and Gynecology

## 2011-04-01 ENCOUNTER — Other Ambulatory Visit: Payer: Self-pay | Admitting: Obstetrics and Gynecology

## 2011-04-01 DIAGNOSIS — N83339 Acquired atrophy of ovary and fallopian tube, unspecified side: Secondary | ICD-10-CM

## 2011-04-01 DIAGNOSIS — N950 Postmenopausal bleeding: Secondary | ICD-10-CM

## 2011-04-01 DIAGNOSIS — N951 Menopausal and female climacteric states: Secondary | ICD-10-CM

## 2011-04-01 DIAGNOSIS — Z780 Asymptomatic menopausal state: Secondary | ICD-10-CM

## 2011-04-01 MED ORDER — AZITHROMYCIN 250 MG PO TABS: ORAL_TABLET | ORAL | Status: AC

## 2011-04-01 NOTE — Progress Notes (Signed)
The patient came back today for a SIH because of postmenopausal bleeding. She has stopped bleeding. Her biopsy was benign. Her uterus today appears normal with a homogeneous echo pattern. Her endometrial echo is 1.4 mm. She has normal ovaries. A catheter was placed in the uterus. Saline was introduced. There were no intrauterine cavity defects seen. The patient was reassured. She will call if bleeding persists.  Other problems discussed with the patient were a persistent upper respiratory infection of 3 weeks with sputum. She is not running a fever but does not feel good. Her chest was clear to P&A. She was treated with a Z-Pak.  Patient is getting red marks from her Climara patch. She was switched will minivelle 0.05 mg as there is not a 0.06 mg one. She will use twice a week. Samples given. She will call for prescription if happy.

## 2011-04-16 ENCOUNTER — Telehealth: Payer: Self-pay | Admitting: *Deleted

## 2011-04-16 ENCOUNTER — Other Ambulatory Visit: Payer: Self-pay | Admitting: *Deleted

## 2011-04-16 NOTE — Telephone Encounter (Signed)
Pt never started on minivelle, she is still taking the climara 0.06% patch (generic) brand.

## 2011-04-16 NOTE — Telephone Encounter (Signed)
Patient had her third episode of bleeding today on a Climara patch. She had had Climara patches left had not started the mini Vivelle yet. Last month she had a normal SIH and endometrial biopsy in our office. We discussed changing the patch to the mini Vivelle due to its slightly lower estrogen level. We also discussed other options if she continues to bleed. She will inform if this happens and we will have her come in to discuss options.

## 2011-04-16 NOTE — Telephone Encounter (Signed)
Pt c/o vaginal bleeding and feeling bloated while on HRT, she had very light spotting yesterday afternoon and today as well. Pt said that she told you this normally happens when a new patch is started. She placed new patch on Monday and that evening she had spotting. Pt was told to follow up if any further bleeding. Please advise

## 2011-04-16 NOTE — Telephone Encounter (Signed)
Is this her first month on Minivelle? She was previously on a different patch.

## 2011-04-24 ENCOUNTER — Telehealth: Payer: Self-pay | Admitting: *Deleted

## 2011-04-24 MED ORDER — ESTRADIOL 0.05 MG/24HR TD PTTW: 1.0000 | MEDICATED_PATCH | TRANSDERMAL | Status: DC

## 2011-04-24 NOTE — Telephone Encounter (Signed)
Patient called and requested rx for Minivelle patch to be called in.  Sent rx in till May 2013

## 2011-04-30 ENCOUNTER — Ambulatory Visit (INDEPENDENT_AMBULATORY_CARE_PROVIDER_SITE_OTHER): Payer: BC Managed Care – PPO | Admitting: Obstetrics and Gynecology

## 2011-04-30 DIAGNOSIS — N950 Postmenopausal bleeding: Secondary | ICD-10-CM

## 2011-04-30 NOTE — Progress Notes (Signed)
Patient came to see me today because of persistent postmenopausal bleeding. She has been on HRT now for about 4 years. Most of the time she was on femhrt 1 mgm. Last June she became symptomatic. Her estradiol was low. We switched her to Climara 0.075 mg weekly and daily medroxyprogesterone and she did well. Then earlier this year she had 2 episodes of postmenopausal bleeding. Saline infusion histogram and endometrial biopsy were normal. She had another episode of bleeding. We switched her to a 0.05 mg miniVivelle patch and she's now had another period 2 weeks later.   Exam: Heather Chambers present.Pelvic exam: External: Within normal limits. BUS within normal limits. Vaginal exam: Within normal limits. Cervix: Clean. Uterus: Within normal limits. Adnexa: Within normal limits. Rectovaginal exam: Within normal limits.   Assessment: Persistent postmenopausal bleeding on HRT.  Plan: Discussed many options including endometrial ablation or a Mirena IUD. At one point we discussed just stopping HRT but she is under a lot of stress at work and doesn't want to do that. For the moment we will continue the patch and not use any progesterone. If she continues to bleed we'll add Megace. She is due in May for exam and we'll make a final change then. Today she favored going back on a combination oral medication such as femhrt  or Activella.

## 2011-05-14 ENCOUNTER — Telehealth: Payer: Self-pay | Admitting: *Deleted

## 2011-05-14 MED ORDER — VALACYCLOVIR HCL 500 MG PO TABS: ORAL_TABLET | ORAL | Status: DC

## 2011-05-14 NOTE — Telephone Encounter (Signed)
You can refill this. Ask  patient how many she wants and give her that many. You can give her refills. Most people take twice a day when they get a fever blister. Check with her regarding directions.

## 2011-05-14 NOTE — Telephone Encounter (Signed)
Pt informed with the below note, rx sent to pharmacy. 

## 2011-05-14 NOTE — Telephone Encounter (Signed)
Pt called requesting new Rx for valtrex  500 mg for her fever blisters. Last Rx was given back in April/2010. Please advise

## 2011-07-14 ENCOUNTER — Other Ambulatory Visit: Payer: Self-pay | Admitting: Obstetrics and Gynecology

## 2011-07-29 ENCOUNTER — Ambulatory Visit (INDEPENDENT_AMBULATORY_CARE_PROVIDER_SITE_OTHER): Payer: BC Managed Care – PPO | Admitting: Obstetrics and Gynecology

## 2011-07-29 ENCOUNTER — Encounter: Payer: Self-pay | Admitting: Obstetrics and Gynecology

## 2011-07-29 VITALS — BP 120/70 | Ht 63.25 in | Wt 124.0 lb

## 2011-07-29 DIAGNOSIS — M899 Disorder of bone, unspecified: Secondary | ICD-10-CM

## 2011-07-29 DIAGNOSIS — E039 Hypothyroidism, unspecified: Secondary | ICD-10-CM

## 2011-07-29 DIAGNOSIS — Z01419 Encounter for gynecological examination (general) (routine) without abnormal findings: Secondary | ICD-10-CM

## 2011-07-29 DIAGNOSIS — M8580 Other specified disorders of bone density and structure, unspecified site: Secondary | ICD-10-CM

## 2011-07-29 LAB — TSH: TSH: 0.667 u[IU]/mL (ref 0.350–4.500)

## 2011-07-29 MED ORDER — ESTRADIOL-NORETHINDRONE ACET 1-0.5 MG PO TABS: 1.0000 | ORAL_TABLET | Freq: Every day | ORAL | Status: DC

## 2011-07-29 MED ORDER — LEVOTHYROXINE SODIUM 88 MCG PO TABS: 88.0000 ug | ORAL_TABLET | Freq: Every day | ORAL | Status: DC

## 2011-07-29 NOTE — Progress Notes (Addendum)
Patient came to see me today for an annual GYN exam. Because of persistent postmenopausal bleeding and a negative workup we stopped her progesterone temporarily to give her break from the  Bleeding. She has had no bleeding on the patch alone. We had originally switched her to a patch due to symptoms on oral estrogen. For a while she had done well on oral estrogen. We have discussed options if bleeding reoccurs when we reinitiate progesterone. She is having no pelvic pain. She is up-to-date on mammograms. She needs a followup bone density due to osteopenia without an elevated FRAX risk. Her last bone density showed some improvement in one area and worsening in  another area. She has had no fractures. She got her cholesterol checked at work and was normal. We are treating her with Synthroid for hypothyroidism and she is doing well. She has had no abnormal Pap smears. Her last Pap smear was 2012 and was normal.  Physical examination:Heather Chambers present. HEENT within normal limits. Neck: Thyroid not large. No masses. Supraclavicular nodes: not enlarged. Breasts: Examined in both sitting and lying  position. No skin changes and no masses. Abdomen: Soft no guarding rebound or masses or hernia. Pelvic: External: Within normal limits. BUS: Within normal limits. Vaginal:within normal limits. Good estrogen effect. No evidence of cystocele rectocele or enterocele. Cervix: clean. Uterus: Normal size and shape. Adnexa: No masses. Rectovaginal exam: Confirmatory and negative. Extremities: Within normal limits.  Assessment: #1. Vasomotor symptoms #2. Postmenopausal bleeding persistent #3. Osteopenia #4. Hypothyroidism  Plan: TSH checked. Continue Synthroid. Switched her to Activella 1 mg to see if she would do well on that both in terms of symptoms and bleeding. Bone density scheduled. Continue yearly mammograms.

## 2011-07-30 LAB — URINALYSIS W MICROSCOPIC + REFLEX CULTURE
Bilirubin Urine: NEGATIVE
Crystals: NONE SEEN
Hgb urine dipstick: NEGATIVE
Leukocytes, UA: NEGATIVE
Nitrite: NEGATIVE
Protein, ur: NEGATIVE mg/dL
Squamous Epithelial / LPF: NONE SEEN

## 2011-08-19 ENCOUNTER — Other Ambulatory Visit: Payer: Self-pay | Admitting: *Deleted

## 2011-08-19 MED ORDER — LEVOTHYROXINE SODIUM 88 MCG PO TABS: 88.0000 ug | ORAL_TABLET | Freq: Every day | ORAL | Status: DC

## 2011-08-19 NOTE — Progress Notes (Signed)
rx for Synthroid needed to be sent to [facility].

## 2011-08-20 ENCOUNTER — Other Ambulatory Visit: Payer: Self-pay | Admitting: Internal Medicine

## 2011-08-20 MED ORDER — MOMETASONE FUROATE 50 MCG/ACT NA SUSP: 2.0000 | Freq: Every day | NASAL | Status: DC

## 2011-08-20 NOTE — Telephone Encounter (Signed)
[facility] requesting rx for nasonex  rx sent

## 2011-08-27 ENCOUNTER — Telehealth: Payer: Self-pay | Admitting: *Deleted

## 2011-08-27 MED ORDER — LEVOTHYROXINE SODIUM 88 MCG PO TABS: 88.0000 ug | ORAL_TABLET | Freq: Every day | ORAL | Status: DC

## 2011-08-27 NOTE — Telephone Encounter (Signed)
Pt would like to have brand only synthroid 88 mcg tablets, pt said the generic doesn't work for her. 1 month supply sent to local pharmacy.

## 2011-09-13 ENCOUNTER — Other Ambulatory Visit: Payer: Self-pay | Admitting: *Deleted

## 2011-09-13 MED ORDER — LEVOTHYROXINE SODIUM 88 MCG PO TABS: 88.0000 ug | ORAL_TABLET | Freq: Every day | ORAL | Status: DC

## 2011-09-17 ENCOUNTER — Other Ambulatory Visit: Payer: Self-pay | Admitting: Obstetrics and Gynecology

## 2011-09-17 DIAGNOSIS — Z1231 Encounter for screening mammogram for malignant neoplasm of breast: Secondary | ICD-10-CM

## 2011-10-04 ENCOUNTER — Other Ambulatory Visit (INDEPENDENT_AMBULATORY_CARE_PROVIDER_SITE_OTHER): Payer: BC Managed Care – PPO

## 2011-10-04 ENCOUNTER — Encounter: Payer: Self-pay | Admitting: Internal Medicine

## 2011-10-04 ENCOUNTER — Ambulatory Visit (INDEPENDENT_AMBULATORY_CARE_PROVIDER_SITE_OTHER): Payer: BC Managed Care – PPO | Admitting: Internal Medicine

## 2011-10-04 VITALS — BP 136/88 | HR 81 | Temp 98.0°F | Ht 63.0 in | Wt 126.8 lb

## 2011-10-04 DIAGNOSIS — J310 Chronic rhinitis: Secondary | ICD-10-CM

## 2011-10-04 DIAGNOSIS — Z0000 Encounter for general adult medical examination without abnormal findings: Secondary | ICD-10-CM

## 2011-10-04 DIAGNOSIS — M25539 Pain in unspecified wrist: Secondary | ICD-10-CM

## 2011-10-04 DIAGNOSIS — E039 Hypothyroidism, unspecified: Secondary | ICD-10-CM

## 2011-10-04 LAB — URINALYSIS
Leukocytes, UA: NEGATIVE
Specific Gravity, Urine: 1.01 (ref 1.000–1.030)
Urobilinogen, UA: 0.2 (ref 0.0–1.0)

## 2011-10-04 LAB — HEPATIC FUNCTION PANEL
ALT: 13 U/L (ref 0–35)
Albumin: 4.3 g/dL (ref 3.5–5.2)
Total Bilirubin: 0.6 mg/dL (ref 0.3–1.2)

## 2011-10-04 LAB — CBC WITH DIFFERENTIAL/PLATELET
Basophils Absolute: 0 10*3/uL (ref 0.0–0.1)
Eosinophils Absolute: 0 10*3/uL (ref 0.0–0.7)
Lymphocytes Relative: 23.9 % (ref 12.0–46.0)
MCHC: 32.5 g/dL (ref 30.0–36.0)
Neutrophils Relative %: 69.3 % (ref 43.0–77.0)
Platelets: 289 10*3/uL (ref 150.0–400.0)
RBC: 4.56 Mil/uL (ref 3.87–5.11)
RDW: 14.4 % (ref 11.5–14.6)

## 2011-10-04 LAB — BASIC METABOLIC PANEL
CO2: 24 mEq/L (ref 19–32)
Calcium: 9 mg/dL (ref 8.4–10.5)
Creatinine, Ser: 0.8 mg/dL (ref 0.4–1.2)
Glucose, Bld: 93 mg/dL (ref 70–99)

## 2011-10-04 LAB — TSH: TSH: 2.64 u[IU]/mL (ref 0.35–5.50)

## 2011-10-04 NOTE — Patient Instructions (Addendum)
Please see patient coordinator before you leave today  to schedule orthopedic evaluation for R wrist  Please remember to go to the lab  department downstairs for your tests - we will call you with the results when they are available.  Strongly recommend a minimum of  30 minutes of aerobic exercsie at least 3 x weekly

## 2011-10-04 NOTE — Assessment & Plan Note (Signed)
Adequate control on present rx, reviewed  

## 2011-10-04 NOTE — Progress Notes (Signed)
Subjective:     Patient ID: Heather Chambers, female   DOB: March 24, 1955   MRN: [id number]   Brief patient profile:  77 yowf never smoker with h/o chronic rhinitis and hypothyroidism and chronic/ recurrent migraines  February 24, 2009 Acute visit. Pt c/o right side facial pressure and HA x 1 month. worse when supine She has had some PND also and some dry cough. She has noticed ringing in her right ear. Year round daily nose running ever since arrived in [place] with allergy shots bad tol no help.Zpak should turn any disclored mucus to clear, if not call back  Prednisone 10 4 each am x 2days, 2x2days, 1x2days and stop  For pain, tramadol 50mg  1-2 every 4 hour   10/17/2010 f/u ov/Wert cc recurrent ha x 6 months 3/4 weeks, declines referral back to [place].  No aura but otherwise a typical migrain pattern for her, no neuro symptoms, pos photophia and nausea, responds the best to imitrex. rec Refer to HA specialist prn  10/04/2011 f/u ov/Wert CPX only problem is R wrist pain chronic, some better with wrist support, nsaid's some worse with regular wt lifting. No other arthritis. Ha's ok control on rx with prn imitrex. Very stressed about job.  Sleeping ok without nocturnal  or early am exacerbation  of respiratory  C/o's.  Also denies any obvious fluctuation of symptoms with weather or environmental changes or other aggravating or alleviating factors except as outlined above   ROS  The following are not active complaints unless bolded sore throat, dysphagia, dental problems, itching, sneezing,  nasal congestion or excess/ purulent secretions, ear ache,   fever, chills, sweats, unintended wt loss, pleuritic or exertional cp, hemoptysis,  orthopnea pnd or leg swelling, presyncope, palpitations, heartburn, abdominal pain, anorexia, nausea, vomiting, diarrhea  or change in bowel or urinary habits, change in stools or urine, dysuria,hematuria,  rash, arthralgias, visual complaints, headache, numbness weakness or  ataxia or problems with walking or coordination,  change in mood/affect or memory.       Past Medical History:  HYPOTHYROIDIMS  CHRONIC RHINITIS  - Chronic rhinitis flyer (# 4th) given November 20, 2007  HEALTH MAINTENANCE ................................................Marland KitchenWert  - CPX 10/04/2011  - Pneumovax February 24, 2009  - Td  Jan 2011 - Colonsocopy referral November 30, 2009  GYN CARE....................................................................Marland KitchenGotsagen        Objective:   Physical Exam wt 132 November 20, 2007 > 131 February 25, 2009 > 130 December 01, 2009 > 10/17/2010  133 > 10/04/2011  126  Ambulatory healthy appearing in no acute distress  HEENT: nl dentition,, and orophanx. Nl external ear canals without cough reflex/ mod nonspecific turbinate edema  Neck without JVD/Nodes/TM/ no tenderness  Lungs clear to A and P bilaterally without cough on insp or exp maneuvers  RRR no s3 or murmur or increase in P2  Abd soft and benign with nl excursion in the supine position. No bruits or organomegaly  Ext warm without calf tenderness, cyanosis clubbing or edema   Neuro: alert approp, nl gait, no deficits MS no deficits, from including R wrist Skin: no lesions   Assessment:         Plan:

## 2011-10-04 NOTE — Assessment & Plan Note (Addendum)
prob tendonitis R wrist, rx prn nsaid's and needs at least one hand surgery eval to teach self management of what will probably be a recurrent problem

## 2011-10-04 NOTE — Assessment & Plan Note (Signed)
Check vit D levels

## 2011-10-04 NOTE — Assessment & Plan Note (Signed)
Lab Results  Component Value Date   TSH 2.64 10/04/2011    Adequate control on present rx, reviewed

## 2011-10-05 ENCOUNTER — Other Ambulatory Visit: Payer: Self-pay | Admitting: Obstetrics and Gynecology

## 2011-10-07 ENCOUNTER — Telehealth: Payer: Self-pay | Admitting: Internal Medicine

## 2011-10-07 NOTE — Telephone Encounter (Signed)
Called and spoke with patient returning Leslies call regarding lab results/recs as listed below.  Patient verbalized understanding and nothing further needed at this time.  Notes Recorded by Nyoka Cowden, MD on 10/04/2011 at 2:07 PM Call patient : Studies are unremarkable, no change in recs

## 2011-10-08 NOTE — Progress Notes (Signed)
Quick Note:  Called, spoke with pt. She was informed of results yesterdays. Nothing further needed at this time. ______

## 2011-10-18 ENCOUNTER — Ambulatory Visit (HOSPITAL_COMMUNITY)
Admission: RE | Admit: 2011-10-18 | Discharge: 2011-10-18 | Disposition: A | Discharge status: Home / Self Care | Payer: BC Managed Care – PPO | Source: Ambulatory Visit | Attending: Obstetrics and Gynecology | Admitting: Obstetrics and Gynecology

## 2011-10-18 DIAGNOSIS — Z1231 Encounter for screening mammogram for malignant neoplasm of breast: Secondary | ICD-10-CM | POA: Insufficient documentation

## 2011-10-22 ENCOUNTER — Telehealth: Payer: Self-pay | Admitting: Internal Medicine

## 2011-10-22 ENCOUNTER — Other Ambulatory Visit (INDEPENDENT_AMBULATORY_CARE_PROVIDER_SITE_OTHER): Payer: BC Managed Care – PPO

## 2011-10-22 DIAGNOSIS — M25539 Pain in unspecified wrist: Secondary | ICD-10-CM

## 2011-10-22 LAB — URIC ACID: Uric Acid, Serum: 4.4 mg/dL (ref 2.4–7.0)

## 2011-10-22 LAB — SEDIMENTATION RATE: Sed Rate: 4 mm/hr (ref 0–22)

## 2011-10-22 NOTE — Telephone Encounter (Signed)
Dr. Sherene Sires referred pt to Ortho.  She was seen by Dr. Josephine Igo today.  Dr. Teressa Senter wants her to have labs drawn here for uric acid and esr and results faxed to him at [phone].  Dx is 274.0.  Per Tammy D, this is ok.  Verlon Au will need to hold onto paper rx until this has been completed.  Pt aware to go down for labs.   Labs placed for wrist pain as there were no dx of 274.  Will sign off and forward to leslie and dr. Sherene Sires as fyi.

## 2011-10-28 NOTE — Progress Notes (Signed)
Quick Note:  Spoke with pt and notified of results per Dr. Wert. Pt verbalized understanding and denied any questions.  ______ 

## 2011-11-12 ENCOUNTER — Other Ambulatory Visit: Payer: Self-pay | Admitting: Internal Medicine

## 2011-11-18 ENCOUNTER — Other Ambulatory Visit: Payer: Self-pay | Admitting: Obstetrics and Gynecology

## 2011-11-20 ENCOUNTER — Other Ambulatory Visit: Payer: Self-pay | Admitting: *Deleted

## 2011-11-20 MED ORDER — SUMATRIPTAN SUCCINATE 100 MG PO TABS: ORAL_TABLET | ORAL | Status: DC

## 2011-11-25 ENCOUNTER — Other Ambulatory Visit: Payer: Self-pay | Admitting: Obstetrics and Gynecology

## 2011-11-25 MED ORDER — NORETHINDRONE-ETH ESTRADIOL 1-5 MG-MCG PO TABS: 1.0000 | ORAL_TABLET | Freq: Every day | ORAL | Status: DC

## 2011-11-25 MED ORDER — LEVOTHYROXINE SODIUM 88 MCG PO TABS: 88.0000 ug | ORAL_TABLET | Freq: Every day | ORAL | Status: DC

## 2012-03-09 ENCOUNTER — Telehealth: Payer: Self-pay | Admitting: Internal Medicine

## 2012-03-09 MED ORDER — MOMETASONE FUROATE 50 MCG/ACT NA SUSP: 2.0000 | Freq: Every day | NASAL | Status: DC

## 2012-03-09 MED ORDER — SUMATRIPTAN SUCCINATE 100 MG PO TABS: ORAL_TABLET | ORAL | Status: DC

## 2012-03-09 NOTE — Telephone Encounter (Signed)
Pt informed that refill for Nasonex and Imitrex was sent to pharmacy.  Pt will contact Dr Eda Paschal office for Synthroid refill.

## 2012-06-10 ENCOUNTER — Telehealth: Payer: Self-pay | Admitting: *Deleted

## 2012-06-10 NOTE — Telephone Encounter (Signed)
Prior authorization faxed to [employer] of  for Jinteli 1mg -5mg 

## 2012-06-19 NOTE — Telephone Encounter (Signed)
Per [employer] Jinteli 1-5 mcg tablet denied, pharmacy informed as well.

## 2012-06-25 ENCOUNTER — Telehealth: Payer: Self-pay | Admitting: *Deleted

## 2012-06-25 ENCOUNTER — Encounter: Payer: Self-pay | Admitting: *Deleted

## 2012-06-25 NOTE — Telephone Encounter (Signed)
(  Dr.G patient) you will be patient provider her medication Jinteli 1mg - was denied pt said this medication works great for her menopausal symptoms. Pt asked if you would be willing to have a document sent to insurance company stating this medication works for her? And maybe they will approve.  Please advise

## 2012-06-25 NOTE — Telephone Encounter (Signed)
Noted faxed to [facility] regarding the below, pt informed with the below as well.

## 2012-06-25 NOTE — Telephone Encounter (Signed)
Okay to provide a letter that states patient has been on several different hormone replacement regimens with complications of irregular bleeding. She is doing well on her current medication and we would like to continue her on this

## 2012-09-07 ENCOUNTER — Encounter: Payer: Self-pay | Admitting: Nurse Practitioner

## 2012-09-07 ENCOUNTER — Ambulatory Visit (INDEPENDENT_AMBULATORY_CARE_PROVIDER_SITE_OTHER): Payer: BC Managed Care – PPO | Admitting: Nurse Practitioner

## 2012-09-07 VITALS — BP 115/65 | HR 61 | Temp 97.5°F | Resp 16 | Ht 63.0 in | Wt 125.0 lb

## 2012-09-07 DIAGNOSIS — Z0000 Encounter for general adult medical examination without abnormal findings: Secondary | ICD-10-CM

## 2012-09-07 DIAGNOSIS — E039 Hypothyroidism, unspecified: Secondary | ICD-10-CM

## 2012-09-07 NOTE — Patient Instructions (Signed)
Continue with regimen that Dr. Sherene Sires has suggested for sinus pressure, please let me know if you need to discuss further. Discuss hormone replacement therapy with gynecology & its potential contribution to migraine headaches. I will check your thyroid hormone today and make changes to meds as needed-our office will call you to discuss as needed. Come in for lab appointment and flu shot in September. You look great! Pleasure to meet you!  Preventive Care for Adults, Female A healthy lifestyle and preventive care can promote health and wellness. Preventive health guidelines for women include the following key practices.  A routine yearly physical is a good way to check with your caregiver about your health and preventive screening. It is a chance to share any concerns and updates on your health, and to receive a thorough exam.  Visit your dentist for a routine exam and preventive care every 6 months. Brush your teeth twice a day and floss once a day. Good oral hygiene prevents tooth decay and gum disease.  The frequency of eye exams is based on your age, health, family medical history, use of contact lenses, and other factors. Follow your caregiver's recommendations for frequency of eye exams.  Eat a healthy diet. Foods like vegetables, fruits, whole grains, low-fat dairy products, and lean protein foods contain the nutrients you need without too many calories. Decrease your intake of foods high in solid fats, added sugars, and salt. Eat the right amount of calories for you.Get information about a proper diet from your caregiver, if necessary.  Regular physical exercise is one of the most important things you can do for your health. Most adults should get at least 150 minutes of moderate-intensity exercise (any activity that increases your heart rate and causes you to sweat) each week. In addition, most adults need muscle-strengthening exercises on 2 or more days a week.  Maintain a healthy weight. The  body mass index (BMI) is a screening tool to identify possible weight problems. It provides an estimate of body fat based on height and weight. Your caregiver can help determine your BMI, and can help you achieve or maintain a healthy weight.For adults 20 years and older:  A BMI below 18.5 is considered underweight.  A BMI of 18.5 to 24.9 is normal.  A BMI of 25 to 29.9 is considered overweight.  A BMI of 30 and above is considered obese.  Maintain normal blood lipids and cholesterol levels by exercising and minimizing your intake of saturated fat. Eat a balanced diet with plenty of fruit and vegetables. Blood tests for lipids and cholesterol should begin at age 51 and be repeated every 5 years. If your lipid or cholesterol levels are high, you are over 50, or you are at high risk for heart disease, you may need your cholesterol levels checked more frequently.Ongoing high lipid and cholesterol levels should be treated with medicines if diet and exercise are not effective.  If you smoke, find out from your caregiver how to quit. If you do not use tobacco, do not start.  If you are pregnant, do not drink alcohol. If you are breastfeeding, be very cautious about drinking alcohol. If you are not pregnant and choose to drink alcohol, do not exceed 1 drink per day. One drink is considered to be 12 ounces (355 mL) of beer, 5 ounces (148 mL) of wine, or 1.5 ounces (44 mL) of liquor.  Avoid use of street drugs. Do not share needles with anyone. Ask for help if you need  support or instructions about stopping the use of drugs.  High blood pressure causes heart disease and increases the risk of stroke. Your blood pressure should be checked at least every 1 to 2 years. Ongoing high blood pressure should be treated with medicines if weight loss and exercise are not effective.  If you are 78 to 55 years old, ask your caregiver if you should take aspirin to prevent strokes.  Diabetes screening involves  taking a blood sample to check your fasting blood sugar level. This should be done once every 3 years, after age 63, if you are within normal weight and without risk factors for diabetes. Testing should be considered at a younger age or be carried out more frequently if you are overweight and have at least 1 risk factor for diabetes.  Breast cancer screening is essential preventive care for women. You should practice "breast self-awareness." This means understanding the normal appearance and feel of your breasts and may include breast self-examination. Any changes detected, no matter how small, should be reported to a caregiver. Women in their 66s and 30s should have a clinical breast exam (CBE) by a caregiver as part of a regular health exam every 1 to 3 years. After age 93, women should have a CBE every year. Starting at age 42, women should consider having a mammography (breast X-ray test) every year. Women who have a family history of breast cancer should talk to their caregiver about genetic screening. Women at a high risk of breast cancer should talk to their caregivers about having magnetic resonance imaging (MRI) and a mammography every year.  The Pap test is a screening test for cervical cancer. A Pap test can show cell changes on the cervix that might become cervical cancer if left untreated. A Pap test is a procedure in which cells are obtained and examined from the lower end of the uterus (cervix).  Women should have a Pap test starting at age 16.  Between ages 58 and 28, Pap tests should be repeated every 2 years.  Beginning at age 49, you should have a Pap test every 3 years as long as the past 3 Pap tests have been normal.  Some women have medical problems that increase the chance of getting cervical cancer. Talk to your caregiver about these problems. It is especially important to talk to your caregiver if a new problem develops soon after your last Pap test. In these cases, your  caregiver may recommend more frequent screening and Pap tests.  The above recommendations are the same for women who have or have not gotten the vaccine for human papillomavirus (HPV).  If you had a hysterectomy for a problem that was not cancer or a condition that could lead to cancer, then you no longer need Pap tests. Even if you no longer need a Pap test, a regular exam is a good idea to make sure no other problems are starting.  If you are between ages 15 and 57, and you have had normal Pap tests going back 10 years, you no longer need Pap tests. Even if you no longer need a Pap test, a regular exam is a good idea to make sure no other problems are starting.  If you have had past treatment for cervical cancer or a condition that could lead to cancer, you need Pap tests and screening for cancer for at least 20 years after your treatment.  If Pap tests have been discontinued, risk factors (such as a  new sexual partner) need to be reassessed to determine if screening should be resumed.  The HPV test is an additional test that may be used for cervical cancer screening. The HPV test looks for the virus that can cause the cell changes on the cervix. The cells collected during the Pap test can be tested for HPV. The HPV test could be used to screen women aged 74 years and older, and should be used in women of any age who have unclear Pap test results. After the age of 52, women should have HPV testing at the same frequency as a Pap test.  Colorectal cancer can be detected and often prevented. Most routine colorectal cancer screening begins at the age of 30 and continues through age 63. However, your caregiver may recommend screening at an earlier age if you have risk factors for colon cancer. On a yearly basis, your caregiver may provide home test kits to check for hidden blood in the stool. Use of a small camera at the end of a tube, to directly examine the colon (sigmoidoscopy or colonoscopy), can  detect the earliest forms of colorectal cancer. Talk to your caregiver about this at age 33, when routine screening begins. Direct examination of the colon should be repeated every 5 to 10 years through age 25, unless early forms of pre-cancerous polyps or small growths are found.  Hepatitis C blood testing is recommended for all people born from [birth year] through 1965 and any individual with known risks for hepatitis C.  Practice safe sex. Use condoms and avoid high-risk sexual practices to reduce the spread of sexually transmitted infections (STIs). STIs include gonorrhea, chlamydia, syphilis, trichomonas, herpes, HPV, and human immunodeficiency virus (HIV). Herpes, HIV, and HPV are viral illnesses that have no cure. They can result in disability, cancer, and death. Sexually active women aged 91 and younger should be checked for chlamydia. Older women with new or multiple partners should also be tested for chlamydia. Testing for other STIs is recommended if you are sexually active and at increased risk.  Osteoporosis is a disease in which the bones lose minerals and strength with aging. This can result in serious bone fractures. The risk of osteoporosis can be identified using a bone density scan. Women ages 43 and over and women at risk for fractures or osteoporosis should discuss screening with their caregivers. Ask your caregiver whether you should take a calcium supplement or vitamin D to reduce the rate of osteoporosis.  Menopause can be associated with physical symptoms and risks. Hormone replacement therapy is available to decrease symptoms and risks. You should talk to your caregiver about whether hormone replacement therapy is right for you.  Use sunscreen with sun protection factor (SPF) of 30 or more. Apply sunscreen liberally and repeatedly throughout the day. You should seek shade when your shadow is shorter than you. Protect yourself by wearing long sleeves, pants, a wide-brimmed hat, and  sunglasses year round, whenever you are outdoors.  Once a month, do a whole body skin exam, using a mirror to look at the skin on your back. Notify your caregiver of new moles, moles that have irregular borders, moles that are larger than a pencil eraser, or moles that have changed in shape or color.  Stay current with required immunizations.  Influenza. You need a dose every fall (or winter). The composition of the flu vaccine changes each year, so being vaccinated once is not enough.  Pneumococcal polysaccharide. You need 1 to 2 doses if you  smoke cigarettes or if you have certain chronic medical conditions. You need 1 dose at age 43 (or older) if you have never been vaccinated.  Tetanus, diphtheria, pertussis (Tdap, Td). Get 1 dose of Tdap vaccine if you are younger than age 6, are over 91 and have contact with an infant, are a Research scientist (physical sciences), are pregnant, or simply want to be protected from whooping cough. After that, you need a Td booster dose every 10 years. Consult your caregiver if you have not had at least 3 tetanus and diphtheria-containing shots sometime in your life or have a deep or dirty wound.  HPV. You need this vaccine if you are a woman age 81 or younger. The vaccine is given in 3 doses over 6 months.  Measles, mumps, rubella (MMR). You need at least 1 dose of MMR if you were born in 1957 or later. You may also need a second dose.  Meningococcal. If you are age 36 to 71 and a first-year college student living in a residence hall, or have one of several medical conditions, you need to get vaccinated against meningococcal disease. You may also need additional booster doses.  Zoster (shingles). If you are age 24 or older, you should get this vaccine.  Varicella (chickenpox). If you have never had chickenpox or you were vaccinated but received only 1 dose, talk to your caregiver to find out if you need this vaccine.  Hepatitis A. You need this vaccine if you have a specific  risk factor for hepatitis A virus infection or you simply wish to be protected from this disease. The vaccine is usually given as 2 doses, 6 to 18 months apart.  Hepatitis B. You need this vaccine if you have a specific risk factor for hepatitis B virus infection or you simply wish to be protected from this disease. The vaccine is given in 3 doses, usually over 6 months. Preventive Services / Frequency Ages 80 to 64  Blood pressure check.** / Every 1 to 2 years.  Lipid and cholesterol check.** / Every 5 years beginning at age 31.  Clinical breast exam.** / Every 3 years for women in their 81s and 30s.  Pap test.** / Every 2 years from ages 30 through 9. Every 3 years starting at age 84 through age 31 or 40 with a history of 3 consecutive normal Pap tests.  HPV screening.** / Every 3 years from ages 29 through ages 61 to 8 with a history of 3 consecutive normal Pap tests.  Hepatitis C blood test.** / For any individual with known risks for hepatitis C.  Skin self-exam. / Monthly.  Influenza immunization.** / Every year.  Pneumococcal polysaccharide immunization.** / 1 to 2 doses if you smoke cigarettes or if you have certain chronic medical conditions.  Tetanus, diphtheria, pertussis (Tdap, Td) immunization. / A one-time dose of Tdap vaccine. After that, you need a Td booster dose every 10 years.  HPV immunization. / 3 doses over 6 months, if you are 15 and younger.  Measles, mumps, rubella (MMR) immunization. / You need at least 1 dose of MMR if you were born in 1957 or later. You may also need a second dose.  Meningococcal immunization. / 1 dose if you are age 76 to 66 and a first-year college student living in a residence hall, or have one of several medical conditions, you need to get vaccinated against meningococcal disease. You may also need additional booster doses.  Varicella immunization.** / Consult your caregiver.  Hepatitis A immunization.** / Consult your caregiver. 2  doses, 6 to 18 months apart.  Hepatitis B immunization.** / Consult your caregiver. 3 doses usually over 6 months. Ages 22 to 53  Blood pressure check.** / Every 1 to 2 years.  Lipid and cholesterol check.** / Every 5 years beginning at age 46.  Clinical breast exam.** / Every year after age 18.  Mammogram.** / Every year beginning at age 52 and continuing for as long as you are in good health. Consult with your caregiver.  Pap test.** / Every 3 years starting at age 68 through age 1 or 53 with a history of 3 consecutive normal Pap tests.  HPV screening.** / Every 3 years from ages 25 through ages 45 to 52 with a history of 3 consecutive normal Pap tests.  Fecal occult blood test (FOBT) of stool. / Every year beginning at age 31 and continuing until age 70. You may not need to do this test if you get a colonoscopy every 10 years.  Flexible sigmoidoscopy or colonoscopy.** / Every 5 years for a flexible sigmoidoscopy or every 10 years for a colonoscopy beginning at age 50 and continuing until age 51.  Hepatitis C blood test.** / For all people born from [birth year] through 1963 and any individual with known risks for hepatitis C.  Skin self-exam. / Monthly.  Influenza immunization.** / Every year.  Pneumococcal polysaccharide immunization.** / 1 to 2 doses if you smoke cigarettes or if you have certain chronic medical conditions.  Tetanus, diphtheria, pertussis (Tdap, Td) immunization.** / A one-time dose of Tdap vaccine. After that, you need a Td booster dose every 10 years.  Measles, mumps, rubella (MMR) immunization. / You need at least 1 dose of MMR if you were born in 1957 or later. You may also need a second dose.  Varicella immunization.** / Consult your caregiver.  Meningococcal immunization.** / Consult your caregiver.  Hepatitis A immunization.** / Consult your caregiver. 2 doses, 6 to 18 months apart.  Hepatitis B immunization.** / Consult your caregiver. 3 doses, usually  over 6 months. Ages 18 and over  Blood pressure check.** / Every 1 to 2 years.  Lipid and cholesterol check.** / Every 5 years beginning at age 30.  Clinical breast exam.** / Every year after age 60.  Mammogram.** / Every year beginning at age 65 and continuing for as long as you are in good health. Consult with your caregiver.  Pap test.** / Every 3 years starting at age 11 through age 61 or 35 with a 3 consecutive normal Pap tests. Testing can be stopped between 65 and 70 with 3 consecutive normal Pap tests and no abnormal Pap or HPV tests in the past 10 years.  HPV screening.** / Every 3 years from ages 31 through ages 20 or 55 with a history of 3 consecutive normal Pap tests. Testing can be stopped between 65 and 70 with 3 consecutive normal Pap tests and no abnormal Pap or HPV tests in the past 10 years.  Fecal occult blood test (FOBT) of stool. / Every year beginning at age 60 and continuing until age 53. You may not need to do this test if you get a colonoscopy every 10 years.  Flexible sigmoidoscopy or colonoscopy.** / Every 5 years for a flexible sigmoidoscopy or every 10 years for a colonoscopy beginning at age 31 and continuing until age 92.  Hepatitis C blood test.** / For all people born from [birth year] through 1963 and any individual  with known risks for hepatitis C.  Osteoporosis screening.** / A one-time screening for women ages 84 and over and women at risk for fractures or osteoporosis.  Skin self-exam. / Monthly.  Influenza immunization.** / Every year.  Pneumococcal polysaccharide immunization.** / 1 dose at age 63 (or older) if you have never been vaccinated.  Tetanus, diphtheria, pertussis (Tdap, Td) immunization. / A one-time dose of Tdap vaccine if you are over 65 and have contact with an infant, are a Research scientist (physical sciences), or simply want to be protected from whooping cough. After that, you need a Td booster dose every 10 years.  Varicella immunization.** / Consult your  caregiver.  Meningococcal immunization.** / Consult your caregiver.  Hepatitis A immunization.** / Consult your caregiver. 2 doses, 6 to 18 months apart.  Hepatitis B immunization.** / Check with your caregiver. 3 doses, usually over 6 months. ** Family history and personal history of risk and conditions may change your caregiver's recommendations. Document Released: 03/26/2001 Document Revised: 04/22/2011 Document Reviewed: 06/25/2010 [facility] Patient Information 2014 [place], [place].

## 2012-09-07 NOTE — Progress Notes (Signed)
Subjective:     .Heather Chambers is a 57 y.o. female and is here to establish care. She has been seeing Dr. Sherene Sires for 20 years for primary care. She is currently treated for hypothyroidism, chronic rhinitis, migraine HA, and is on HRT for intolerable postmenopausal symptoms. Today she c/o sinus HA behind R eye. She has a written regimen from Dr. Sherene Sires for sinus flares that she implemented today (Afrin, nasonex, Advil cold & sinus).  History   Social History  . Marital Status: Married    Spouse Name: N/A    Number of Children: 1  . Years of Education: N/A   Occupational History  .  [employer]   Social History Main Topics  . Smoking status: Never Smoker   . Smokeless tobacco: Never Used  . Alcohol Use: 2.4 oz/week    4 Cans of beer per week  . Drug Use: Not on file  . Sexually Active: Yes    Birth Control/ Protection: Post-menopausal   Other Topics Concern  . Not on file   Social History Narrative   Ms. Heather Chambers lives with her husband. She has a grown son who recently moved to [place] for a job. She worked for the [employer] for 30 years, was laid off last year. She will be starting a new job in a few weeks.   Health Maintenance  Topic Date Due  . Colonoscopy  09/27/2007  . Influenza Vaccine  10/12/2012  . Pap Smear  08/05/2013  . Mammogram  10/17/2013  . Tetanus/tdap  02/25/2019    The following portions of the patient's history were reviewed and updated as appropriate: allergies, current medications, past family history, past medical history, past social history, past surgical history and problem list.  Review of Systems Constitutional: negative for anorexia, chills, fatigue and fevers Eyes: positive for contacts/glasses Ears, nose, mouth, throat, and face: positive for nasal congestion and itchy R ear, negative for ear drainage, earaches, hearing loss, hoarseness and tinnitus Respiratory: negative for asthma, chronic bronchitis, cough and  sputum Cardiovascular: negative for chest pain, chest pressure/discomfort, dyspnea, fatigue and irregular heart beat Gastrointestinal: positive for diarrhea and takes citrucel daily and has resolved problem Genitourinary:negative for frequent UTI Integument/breast: negative for breast lump, breast tenderness, nipple discharge, rash and sister recently treated for inflammatory breast cancer, . Musculoskeletal:positive for arthralgias Neurological: positive for headaches Endocrine: negative for diabetic symptoms including blurry vision, polydipsia, polyphagia, polyuria and poor wound healing and temperature intolerance Allergic/Immunologic: positive for chronic rhinitis   Objective:    BP 115/65  Pulse 61  Temp(Src) 97.5 F (36.4 C) (Oral)  Resp 16  Ht 5\' 3"  (1.6 m)  Wt 125 lb (56.7 kg)  BMI 22.15 kg/m2  SpO2 98% General appearance: alert, cooperative, appears stated age and no distress Head: Normocephalic, without obvious abnormality, atraumatic Eyes: negative findings: lids and lashes normal, conjunctivae and sclerae normal, corneas clear and pupils equal, round, reactive to light and accomodation Ears: normal TM and external ear canal left ear and abnormal TM right ear - retracted Nose: Nares normal. Septum midline. Mucosa normal. No drainage or sinus tenderness. Throat: lips, mucosa, and tongue normal; teeth and gums normal Lungs: clear to auscultation bilaterally Heart: regular rate and rhythm, S1, S2 normal, no murmur, click, rub or gallop Abdomen: soft, non-tender; bowel sounds normal; no masses,  no organomegaly Extremities: extremities normal, atraumatic, no cyanosis or edema Pulses: 2+ and symmetric Lymph nodes: no cervical or supreaclavicular LAD-can palpate tonsilar noes-about 1  cm, NT, moveable Neurologic: Grossly normal    Assessment:    Healthy female exam Hypothyroidism-historical Dx, current treatment-synthroid, last TSH 8/13      Plan:    Prev care- MMG,  bone density, & Pap to be scheduled by gyn, will check prev care labs(renal, hepatic, vit d, CBc) in September, Td in 2011. Colonoscopy done 2011, next in 2021. Tsh today, will adjust meds as needed. See After Visit Summary for Counseling Recommendations

## 2012-09-08 ENCOUNTER — Ambulatory Visit: Payer: BC Managed Care – PPO

## 2012-09-08 ENCOUNTER — Telehealth: Payer: Self-pay | Admitting: Nurse Practitioner

## 2012-09-08 DIAGNOSIS — E039 Hypothyroidism, unspecified: Secondary | ICD-10-CM

## 2012-09-08 LAB — T4, FREE: Free T4: 1.15 ng/dL (ref 0.60–1.60)

## 2012-09-08 MED ORDER — LEVOTHYROXINE SODIUM 75 MCG PO TABS: 75.0000 ug | ORAL_TABLET | Freq: Every day | ORAL | Status: DC

## 2012-09-08 NOTE — Telephone Encounter (Signed)
TSH low, t4 high normal-this may explain her not sleeping well. Will decrease synthroid to 75 mcg. Will check TSH again in 6 weeks. LM on home & cell phone.

## 2012-09-09 NOTE — Telephone Encounter (Signed)
Patient notified of results.

## 2012-09-13 ENCOUNTER — Emergency Department (HOSPITAL_COMMUNITY)
Admission: EM | Admit: 2012-09-13 | Discharge: 2012-09-13 | Disposition: A | Discharge status: Home / Self Care | Payer: BC Managed Care – PPO | Attending: Emergency Medicine | Admitting: Emergency Medicine

## 2012-09-13 ENCOUNTER — Encounter (HOSPITAL_COMMUNITY): Payer: Self-pay | Admitting: *Deleted

## 2012-09-13 DIAGNOSIS — G43909 Migraine, unspecified, not intractable, without status migrainosus: Secondary | ICD-10-CM | POA: Insufficient documentation

## 2012-09-13 DIAGNOSIS — M810 Age-related osteoporosis without current pathological fracture: Secondary | ICD-10-CM | POA: Insufficient documentation

## 2012-09-13 DIAGNOSIS — E039 Hypothyroidism, unspecified: Secondary | ICD-10-CM | POA: Insufficient documentation

## 2012-09-13 DIAGNOSIS — Z79899 Other long term (current) drug therapy: Secondary | ICD-10-CM | POA: Insufficient documentation

## 2012-09-13 DIAGNOSIS — Z8709 Personal history of other diseases of the respiratory system: Secondary | ICD-10-CM | POA: Insufficient documentation

## 2012-09-13 DIAGNOSIS — R112 Nausea with vomiting, unspecified: Secondary | ICD-10-CM | POA: Insufficient documentation

## 2012-09-13 DIAGNOSIS — G8929 Other chronic pain: Secondary | ICD-10-CM | POA: Insufficient documentation

## 2012-09-13 DIAGNOSIS — Z8742 Personal history of other diseases of the female genital tract: Secondary | ICD-10-CM | POA: Insufficient documentation

## 2012-09-13 DIAGNOSIS — IMO0002 Reserved for concepts with insufficient information to code with codable children: Secondary | ICD-10-CM | POA: Insufficient documentation

## 2012-09-13 LAB — POCT I-STAT, CHEM 8
HCT: 44 % (ref 36.0–46.0)
Hemoglobin: 15 g/dL (ref 12.0–15.0)
Sodium: 137 mEq/L (ref 135–145)
TCO2: 18 mmol/L (ref 0–100)

## 2012-09-13 LAB — URINALYSIS, ROUTINE W REFLEX MICROSCOPIC
Leukocytes, UA: NEGATIVE
Nitrite: NEGATIVE
Protein, ur: NEGATIVE mg/dL
Urobilinogen, UA: 0.2 mg/dL (ref 0.0–1.0)

## 2012-09-13 LAB — URINE MICROSCOPIC-ADD ON

## 2012-09-13 LAB — CBC WITH DIFFERENTIAL/PLATELET
Basophils Absolute: 0 10*3/uL (ref 0.0–0.1)
Eosinophils Absolute: 0 10*3/uL (ref 0.0–0.7)
Eosinophils Relative: 0 % (ref 0–5)
Lymphocytes Relative: 10 % — ABNORMAL LOW (ref 12–46)
MCH: 29.8 pg (ref 26.0–34.0)
MCV: 86.6 fL (ref 78.0–100.0)
Neutrophils Relative %: 86 % — ABNORMAL HIGH (ref 43–77)
Platelets: 315 10*3/uL (ref 150–400)
RDW: 13.4 % (ref 11.5–15.5)
WBC: 10.9 10*3/uL — ABNORMAL HIGH (ref 4.0–10.5)

## 2012-09-13 MED ORDER — PROMETHAZINE HCL 25 MG/ML IJ SOLN
12.5000 mg | Freq: Once | INTRAMUSCULAR | Status: AC
  Administered 2012-09-13: 12.5 mg via INTRAVENOUS
  Filled 2012-09-13: qty 1

## 2012-09-13 MED ORDER — ONDANSETRON 8 MG PO TBDP: 8.0000 mg | ORAL_TABLET | Freq: Three times a day (TID) | ORAL | Status: DC | PRN

## 2012-09-13 MED ORDER — SODIUM CHLORIDE 0.9 % IV SOLN: 1000.0000 mL | Freq: Once | INTRAVENOUS | Status: DC

## 2012-09-13 MED ORDER — ONDANSETRON HCL 4 MG/2ML IJ SOLN
4.0000 mg | Freq: Once | INTRAMUSCULAR | Status: AC
  Administered 2012-09-13: 4 mg via INTRAVENOUS
  Filled 2012-09-13: qty 2

## 2012-09-13 MED ORDER — SODIUM CHLORIDE 0.9 % IV SOLN
1000.0000 mL | Freq: Once | INTRAVENOUS | Status: AC
  Administered 2012-09-13: 1000 mL via INTRAVENOUS

## 2012-09-13 MED ORDER — DIPHENHYDRAMINE HCL 25 MG PO CAPS
25.0000 mg | ORAL_CAPSULE | Freq: Once | ORAL | Status: AC
  Administered 2012-09-13: 25 mg via ORAL
  Filled 2012-09-13: qty 1

## 2012-09-13 MED ORDER — DIPHENHYDRAMINE HCL 50 MG/ML IJ SOLN
12.5000 mg | Freq: Once | INTRAMUSCULAR | Status: AC
  Administered 2012-09-13: 12.5 mg via INTRAVENOUS
  Filled 2012-09-13: qty 1

## 2012-09-13 MED ORDER — SUMATRIPTAN SUCCINATE 100 MG PO TABS
100.0000 mg | ORAL_TABLET | ORAL | Status: DC | PRN
  Administered 2012-09-13: 100 mg via ORAL
  Filled 2012-09-13 (×2): qty 1

## 2012-09-13 NOTE — ED Notes (Signed)
Pt states she at at [employer] last night, awoke last night @ 0200 vomited x 2-3 x with dry heaves since that time. Pt also c/o HA, pt states she is unable to take migraine medication. Lights dimmed for comfort. IV est, labs drawn, IVF started, antiemetics given.

## 2012-09-13 NOTE — ED Notes (Signed)
Pt states that she woke up around 2am with sudden onset of vomiting; pt states that she out at [facility] last night for dinner and thinks something did not agree with her; pt states that she has vomited numerous times throughout the night and is now having dry heaves; pt denies diarrhea;denies abd pain; states that she has cramps associated with the vomiting but no pain.

## 2012-09-13 NOTE — ED Provider Notes (Signed)
CSN: [id number]     Arrival date & time 09/13/12  1946 History     First MD Initiated Contact with Patient 09/13/12 1950     Chief Complaint  Patient presents with  . Emesis   (Consider location/radiation/quality/duration/timing/severity/associated sxs/prior Treatment) HPI Heather Chambers is a 57 y.o. female who presents to ED with complaint of nausea and vomiting. Pt states symptoms began last night. States several episodes of nausea and vomiting, now just dry heaves. States no major abdominal pain. No diarrhea. No hematemesis. No medications taken at home. No fever, chills. No urinary symptoms. No vaginal complaints. No back pain. No prior abdominal problems.    Past Medical History  Diagnosis Date  . Chronic rhinitis   . Health maintenance examination   . Hypothyroidism   . Chronic pelvic pain in female 1995    LLQ  . Menopausal symptoms   . Osteoporosis     Osteopenia  . Allergy   . Migraine    Past Surgical History  Procedure Laterality Date  . Pelvic laparoscopy      DIAG LAP   Family History  Problem Relation Age of Onset  . Migraines Mother   . Rheum arthritis Mother   . Breast cancer Maternal Grandmother   . Heart disease Maternal Grandfather   . Breast cancer Sister    History  Substance Use Topics  . Smoking status: Never Smoker   . Smokeless tobacco: Never Used  . Alcohol Use: 2.4 oz/week    4 Cans of beer per week   OB History   Grav Para Term Preterm Abortions TAB SAB Ect Mult Living   1 1        1      Review of Systems  Constitutional: Negative for fever and chills.  HENT: Negative for neck pain.   Respiratory: Negative.   Cardiovascular: Negative.   Gastrointestinal: Positive for nausea and vomiting. Negative for abdominal pain, diarrhea and blood in stool.  Genitourinary: Negative for dysuria, frequency, flank pain, vaginal bleeding, vaginal discharge and pelvic pain.  Musculoskeletal: Negative for back pain.  Neurological: Negative for  dizziness, weakness, light-headedness and headaches.    Allergies  Cefdinir and Etodolac  Home Medications   Current Outpatient Rx  Name  Route  Sig  Dispense  Refill  . ibuprofen (ADVIL,MOTRIN) 200 MG tablet   Oral   Take 200 mg by mouth 2 (two) times daily as needed.           Marland Kitchen levothyroxine (SYNTHROID, LEVOTHROID) 75 MCG tablet   Oral   Take 1 tablet (75 mcg total) by mouth daily.   45 tablet   1   . mometasone (NASONEX) 50 MCG/ACT nasal spray   Nasal   Place 2 sprays into the nose daily.   51 g   11   . Norethindrone-Eth Estradiol (JINTELI PO)   Oral   Take 1 tablet by mouth daily.         . norethindrone-ethinyl estradiol (FEMHRT 1/5) 1-5 MG-MCG TABS   Oral   Take 1 tablet by mouth daily.   28 tablet   7   . SUMAtriptan (IMITREX) 100 MG tablet      Take one tablet every 2 hours at the onset of headache as needed for migraine.   24 tablet   1   . valACYclovir (VALTREX) 500 MG tablet      Take 1 tablet by mouth twice daily as needed.   30 tablet   2  BP 130/59  Pulse 90  Temp(Src) 97.9 F (36.6 C)  Resp 18  Ht 5\' 3"  (1.6 m)  Wt 125 lb (56.7 kg)  BMI 22.15 kg/m2  SpO2 97% Physical Exam  Nursing note and vitals reviewed. Constitutional: She is oriented to person, place, and time. She appears well-developed and well-nourished. No distress.  Eyes: Conjunctivae are normal.  Neck: Neck supple.  Cardiovascular: Normal rate, regular rhythm and normal heart sounds.   Pulmonary/Chest: Effort normal and breath sounds normal. No respiratory distress. She has no wheezes. She has no rales.  Abdominal: Soft. Bowel sounds are normal. She exhibits no distension. There is no tenderness. There is no rebound.  Musculoskeletal: She exhibits no edema.  Neurological: She is alert and oriented to person, place, and time.  Skin: Skin is warm and dry.    ED Course   Procedures (including critical care time)  Results for orders placed during the hospital  encounter of 09/13/12  CBC WITH DIFFERENTIAL      Result Value Range   WBC 10.9 (*) 4.0 - 10.5 K/uL   RBC 4.63  3.87 - 5.11 MIL/uL   Hemoglobin 13.8  12.0 - 15.0 g/dL   HCT 27.2  53.6 - 64.4 %   MCV 86.6  78.0 - 100.0 fL   MCH 29.8  26.0 - 34.0 pg   MCHC 34.4  30.0 - 36.0 g/dL   RDW 03.4  74.2 - 59.5 %   Platelets 315  150 - 400 K/uL   Neutrophils Relative % 86 (*) 43 - 77 %   Neutro Abs 9.4 (*) 1.7 - 7.7 K/uL   Lymphocytes Relative 10 (*) 12 - 46 %   Lymphs Abs 1.1  0.7 - 4.0 K/uL   Monocytes Relative 3  3 - 12 %   Monocytes Absolute 0.4  0.1 - 1.0 K/uL   Eosinophils Relative 0  0 - 5 %   Eosinophils Absolute 0.0  0.0 - 0.7 K/uL   Basophils Relative 0  0 - 1 %   Basophils Absolute 0.0  0.0 - 0.1 K/uL  URINALYSIS, ROUTINE W REFLEX MICROSCOPIC      Result Value Range   Color, Urine YELLOW  YELLOW   APPearance CLOUDY (*) CLEAR   Specific Gravity, Urine 1.017  1.005 - 1.030   pH 6.5  5.0 - 8.0   Glucose, UA NEGATIVE  NEGATIVE mg/dL   Hgb urine dipstick SMALL (*) NEGATIVE   Bilirubin Urine NEGATIVE  NEGATIVE   Ketones, ur >80 (*) NEGATIVE mg/dL   Protein, ur NEGATIVE  NEGATIVE mg/dL   Urobilinogen, UA 0.2  0.0 - 1.0 mg/dL   Nitrite NEGATIVE  NEGATIVE   Leukocytes, UA NEGATIVE  NEGATIVE  URINE MICROSCOPIC-ADD ON      Result Value Range   Squamous Epithelial / LPF MANY (*) RARE   WBC, UA 0-2  <3 WBC/hpf   RBC / HPF 3-6  <3 RBC/hpf   Bacteria, UA FEW (*) RARE   Urine-Other MUCOUS PRESENT    POCT I-STAT, CHEM 8      Result Value Range   Sodium 137  135 - 145 mEq/L   Potassium 3.5  3.5 - 5.1 mEq/L   Chloride 107  96 - 112 mEq/L   BUN 12  6 - 23 mg/dL   Creatinine, Ser 6.38  0.50 - 1.10 mg/dL   Glucose, Bld 756 (*) 70 - 99 mg/dL   Calcium, Ion 4.33 (*) 1.12 - 1.23 mmol/L   TCO2 18  0 - 100 mmol/L   Hemoglobin 15.0  12.0 - 15.0 g/dL   HCT 09.6  04.5 - 40.9 %   Pt developed reaction to phenergan, pt has restlessness, twitching movements. Will try benadryl.    11:00  PM Feeling better. Tolerating PO fluids. Abdomen non tender. Reaction from phenergan improved. Home with zofran.    1. Nausea & vomiting     MDM  pt with nausea, vomiting. No abdominal pain or tenderness. No diarrhea. No fever. Pt treated in ED with zofran initially with mild improvement. Tried phenergan to which she developed a reaction with some muscular twitching and restlessness. This improved with benadryl. Pt feeling better. She is tolerating PO fluids in ED. imitrex ordered per her request for her headache. Home with close follow up with pcp. Suspect viral gastroenteritis.   Filed Vitals:   09/13/12 1958  BP: 130/59  Pulse: 90  Temp: 97.9 F (36.6 C)  Resp: 18  Height: 5\' 3"  (1.6 m)  Weight: 125 lb (56.7 kg)  SpO2: 97%     Myriam Jacobson Kourtney Terriquez, PA-C 09/13/12 2304

## 2012-09-14 NOTE — ED Provider Notes (Signed)
Medical screening examination/treatment/procedure(s) were performed by non-physician practitioner and as supervising physician I was immediately available for consultation/collaboration.   Dagmar Hait, MD 09/14/12 0000

## 2012-10-03 ENCOUNTER — Other Ambulatory Visit: Payer: Self-pay | Admitting: Obstetrics and Gynecology

## 2012-10-05 NOTE — Telephone Encounter (Signed)
Patient has CE scheduled with [place] on 10/16/12.

## 2012-10-16 ENCOUNTER — Encounter: Payer: Self-pay | Admitting: Women's Health

## 2012-10-16 ENCOUNTER — Other Ambulatory Visit (HOSPITAL_COMMUNITY)
Admission: RE | Admit: 2012-10-16 | Discharge: 2012-10-16 | Disposition: A | Discharge status: Home / Self Care | Payer: BC Managed Care – PPO | Source: Ambulatory Visit | Attending: Gynecology | Admitting: Gynecology

## 2012-10-16 ENCOUNTER — Ambulatory Visit (INDEPENDENT_AMBULATORY_CARE_PROVIDER_SITE_OTHER): Payer: BC Managed Care – PPO | Admitting: Women's Health

## 2012-10-16 VITALS — BP 112/70 | Ht 63.0 in | Wt 128.5 lb

## 2012-10-16 DIAGNOSIS — Z79890 Hormone replacement therapy (postmenopausal): Secondary | ICD-10-CM

## 2012-10-16 DIAGNOSIS — M8580 Other specified disorders of bone density and structure, unspecified site: Secondary | ICD-10-CM

## 2012-10-16 DIAGNOSIS — Z01419 Encounter for gynecological examination (general) (routine) without abnormal findings: Secondary | ICD-10-CM | POA: Insufficient documentation

## 2012-10-16 DIAGNOSIS — M899 Disorder of bone, unspecified: Secondary | ICD-10-CM

## 2012-10-16 MED ORDER — NORETHINDRONE-ETH ESTRADIOL 1-5 MG-MCG PO TABS: ORAL_TABLET | ORAL | Status: DC

## 2012-10-16 NOTE — Patient Instructions (Signed)
Health Recommendations for Postmenopausal Women Respected and ongoing research has looked at the most common causes of death, disability, and poor quality of life in postmenopausal women. The causes include heart disease, diseases of blood vessels, diabetes, depression, cancer, and bone loss (osteoporosis). Many things can be done to help lower the chances of developing these and other common problems: CARDIOVASCULAR DISEASE Heart Disease: A heart attack is a medical emergency. Know the signs and symptoms of a heart attack. Below are things women can do to reduce their risk for heart disease.   Do not smoke. If you smoke, quit.  Aim for a healthy weight. Being overweight causes many preventable deaths. Eat a healthy and balanced diet and drink an adequate amount of liquids.  Get moving. Make a commitment to be more physically active. Aim for 30 minutes of activity on most, if not all days of the week.  Eat for heart health. Choose a diet that is low in saturated fat and cholesterol and eliminate trans fat. Include whole grains, vegetables, and fruits. Read and understand the labels on food containers before buying.  Know your numbers. Ask your caregiver to check your blood pressure, cholesterol (total, HDL, LDL, triglycerides) and blood glucose. Work with your caregiver on improving your entire clinical picture.  High blood pressure. Limit or stop your table salt intake (try salt substitute and food seasonings). Avoid salty foods and drinks. Read labels on food containers before buying. Eating well and exercising can help control high blood pressure. STROKE  Stroke is a medical emergency. Stroke may be the result of a blood clot in a blood vessel in the brain or by a brain hemorrhage (bleeding). Know the signs and symptoms of a stroke. To lower the risk of developing a stroke:  Avoid fatty foods.  Quit smoking.  Control your diabetes, blood pressure, and irregular heart rate. THROMBOPHLEBITIS  (BLOOD CLOT) OF THE LEG  Becoming overweight and leading a stationary lifestyle may also contribute to developing blood clots. Controlling your diet and exercising will help lower the risk of developing blood clots. CANCER SCREENING  Breast Cancer: Take steps to reduce your risk of breast cancer.  You should practice "breast self-awareness." This means understanding the normal appearance and feel of your breasts and should include breast self-examination. Any changes detected, no matter how small, should be reported to your caregiver.  After age 40, you should have a clinical breast exam (CBE) every year.  Starting at age 40, you should consider having a mammogram (breast X-ray) every year.  If you have a family history of breast cancer, talk to your caregiver about genetic screening.  If you are at high risk for breast cancer, talk to your caregiver about having an MRI and a mammogram every year.  Intestinal or Stomach Cancer: Tests to consider are a rectal exam, fecal occult blood, sigmoidoscopy, and colonoscopy. Women who are high risk may need to be screened at an earlier age and more often.  Cervical Cancer:  Beginning at age 30, you should have a Pap test every 3 years as long as the past 3 Pap tests have been normal.  If you have had past treatment for cervical cancer or a condition that could lead to cancer, you need Pap tests and screening for cancer for at least 20 years after your treatment.  If you had a hysterectomy for a problem that was not cancer or a condition that could lead to cancer, then you no longer need Pap tests.    If you are between ages 65 and 70, and you have had normal Pap tests going back 10 years, you no longer need Pap tests.  If Pap tests have been discontinued, risk factors (such as a new sexual partner) need to be reassessed to determine if screening should be resumed.  Some medical problems can increase the chance of getting cervical cancer. In these  cases, your caregiver may recommend more frequent screening and Pap tests.  Uterine Cancer: If you have vaginal bleeding after reaching menopause, you should notify your caregiver.  Ovarian cancer: Other than yearly pelvic exams, there are no reliable tests available to screen for ovarian cancer at this time except for yearly pelvic exams.  Lung Cancer: Yearly chest X-rays can detect lung cancer and should be done on high risk women, such as cigarette smokers and women with chronic lung disease (emphysema).  Skin Cancer: A complete body skin exam should be done at your yearly examination. Avoid overexposure to the sun and ultraviolet light lamps. Use a strong sun block cream when in the sun. All of these things are important in lowering the risk of skin cancer. MENOPAUSE Menopause Symptoms: Hormone therapy products are effective for treating symptoms associated with menopause:  Moderate to severe hot flashes.  Night sweats.  Mood swings.  Headaches.  Tiredness.  Loss of sex drive.  Insomnia.  Other symptoms. Hormone replacement carries certain risks, especially in older women. Women who use or are thinking about using estrogen or estrogen with progestin treatments should discuss that with their caregiver. Your caregiver will help you understand the benefits and risks. The ideal dose of hormone replacement therapy is not known. The Food and Drug Administration (FDA) has concluded that hormone therapy should be used only at the lowest doses and for the shortest amount of time to reach treatment goals.  OSTEOPOROSIS Protecting Against Bone Loss and Preventing Fracture: If you use hormone therapy for prevention of bone loss (osteoporosis), the risks for bone loss must outweigh the risk of the therapy. Ask your caregiver about other medications known to be safe and effective for preventing bone loss and fractures. To guard against bone loss or fractures, the following is recommended:  If  you are less than age 50, take 1000 mg of calcium and at least 600 mg of Vitamin D per day.  If you are greater than age 50 but less than age 70, take 1200 mg of calcium and at least 600 mg of Vitamin D per day.  If you are greater than age 70, take 1200 mg of calcium and at least 800 mg of Vitamin D per day. Smoking and excessive alcohol intake increases the risk of osteoporosis. Eat foods rich in calcium and vitamin D and do weight bearing exercises several times a week as your caregiver suggests. DIABETES Diabetes Melitus: If you have Type I or Type 2 diabetes, you should keep your blood sugar under control with diet, exercise and recommended medication. Avoid too many sweets, starchy and fatty foods. Being overweight can make control more difficult. COGNITION AND MEMORY Cognition and Memory: Menopausal hormone therapy is not recommended for the prevention of cognitive disorders such as Alzheimer's disease or memory loss.  DEPRESSION  Depression may occur at any age, but is common in elderly women. The reasons may be because of physical, medical, social (loneliness), or financial problems and needs. If you are experiencing depression because of medical problems and control of symptoms, talk to your caregiver about this. Physical activity and   exercise may help with mood and sleep. Community and volunteer involvement may help your sense of value and worth. If you have depression and you feel that the problem is getting worse or becoming severe, talk to your caregiver about treatment options that are best for you. ACCIDENTS  Accidents are common and can be serious in the elderly woman. Prepare your house to prevent accidents. Eliminate throw rugs, place hand bars in the bath, shower and toilet areas. Avoid wearing high heeled shoes or walking on wet, snowy, and icy areas. Limit or stop driving if you have vision or hearing problems, or you feel you are unsteady with you movements and  reflexes. HEPATITIS C Hepatitis C is a type of viral infection affecting the liver. It is spread mainly through contact with blood from an infected person. It can be treated, but if left untreated, it can lead to severe liver damage over years. Many people who are infected do not know that the virus is in their blood. If you are a "baby-boomer", it is recommended that you have one screening test for Hepatitis C. IMMUNIZATIONS  Several immunizations are important to consider having during your senior years, including:   Tetanus, diptheria, and pertussis booster shot.  Influenza every year before the flu season begins.  Pneumonia vaccine.  Shingles vaccine.  Others as indicated based on your specific needs. Talk to your caregiver about these. Document Released: 03/22/2005 Document Revised: 01/15/2012 Document Reviewed: 11/16/2007 [employer] Patient Information 2014 [employer].  

## 2012-10-16 NOTE — Progress Notes (Signed)
Heather Chambers 10-Jan-1956 [id number]    History:    The patient presents for annual exam.  Postmenopausal on HRT. Had a change in insurance, unable to continue on Jinteli 1/5, used Climara patches weekly for 3 months and then started back on Activella 1 had 5 day menstrual cycle. Had numerous problems with bleeding on HRT when started, had tried Climara patches with progestin po had bleeding, Femhrt had bleeding. 03/2011 negative sonohysterogram, endometrium 1.4 mm. Normal Pap and mammogram history. Osteopenia DEXA 2011 T score -1.7 FRAX 4.4%/0.3%.  Hypothyroid primary care manages. Negative colonoscopy 2011.  Sister diagnosed with breast cancer 2013 at age 70. Maternal grandmother breast cancer died of a brain tumor.   Past medical history, past surgical history, family history and social history were all reviewed and documented in the EPIC chart. 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  76 year old son doing well.   ROS:  A  ROS was performed and pertinent positives and negatives are included in the history.  Exam:  Filed Vitals:   10/16/12 0758  BP: 112/70    General appearance:  Normal Head/Neck:  Normal, without cervical or supraclavicular adenopathy. Thyroid:  Symmetrical, normal in size, without palpable masses or nodularity. Respiratory  Effort:  Normal  Auscultation:  Clear without wheezing or rhonchi Cardiovascular  Auscultation:  Regular rate, without rubs, murmurs or gallops  Edema/varicosities:  Not grossly evident Abdominal  Soft,nontender, without masses, guarding or rebound.  Liver/spleen:  No organomegaly noted  Hernia:  None appreciated  Skin  Inspection:  Grossly normal  Palpation:  Grossly normal Neurologic/psychiatric  Orientation:  Normal with appropriate conversation.  Mood/affect:  Normal  Genitourinary    Breasts: Examined lying and sitting.     Right: Without masses, retractions, discharge  or axillary adenopathy.     Left: Without masses, retractions, discharge or axillary adenopathy.   Inguinal/mons:  Normal without inguinal adenopathy  External genitalia:  Normal  BUS/Urethra/Skene's glands:  Normal  Bladder:  Normal  Vagina:  Normal  Cervix:  Normal  Uterus: normal in size, shape and contour.  Midline and mobile  Adnexa/parametria:     Rt: Without masses or tenderness.   Lt: Without masses or tenderness.  Anus and perineum: Normal  Digital rectal exam: Normal sphincter tone without palpated masses or tenderness  Assessment/Plan:  57 y.o.MWF G1P1  for annual exam.     Episode of bleeding with change in HRT Hypothyroid primary care manages labs and meds Osteopenia  Plan: Repeat DEXA, will schedule. SBE's, continue annual mammogram, breast noted to be dense reviewed  3D tomography and encouraged. Unknown BRCA status of Sister will discuss with her. Continue healthy lifestyle, exercise, calcium rich diet, vitamin D 2000 daily encouraged. Pap. Pap normal 2012, new screening guidelines reviewed. Jintelli 1/5 prescription, proper use, risk for blood clots, strokes, breast cancer. Discussed need to call if any further bleeding.   Harrington Challenger [facility], 8:33 AM 10/16/2012

## 2012-10-20 ENCOUNTER — Encounter: Payer: Self-pay | Admitting: Obstetrics and Gynecology

## 2012-10-28 ENCOUNTER — Ambulatory Visit (INDEPENDENT_AMBULATORY_CARE_PROVIDER_SITE_OTHER): Payer: BC Managed Care – PPO | Admitting: Family Medicine

## 2012-10-28 ENCOUNTER — Encounter: Payer: Self-pay | Admitting: Family Medicine

## 2012-10-28 VITALS — BP 123/84 | HR 73 | Temp 98.4°F | Resp 18 | Ht 63.0 in | Wt 127.0 lb

## 2012-10-28 DIAGNOSIS — J019 Acute sinusitis, unspecified: Secondary | ICD-10-CM

## 2012-10-28 MED ORDER — AMOXICILLIN-POT CLAVULANATE 875-125 MG PO TABS: 1.0000 | ORAL_TABLET | Freq: Two times a day (BID) | ORAL | Status: DC

## 2012-10-28 NOTE — Assessment & Plan Note (Signed)
Augmentin 875 mg bid x 10d. Continue current sinus regimen as directed by Dr. Sherene Sires, her pulmonologist. Saline nasal spray also discussed. Advil cold/sinus prn.

## 2012-10-28 NOTE — Progress Notes (Signed)
OFFICE NOTE  10/28/2012  CC:  Chief Complaint  Patient presents with  . Sore Throat  . Facial Pain    pressure  . Otalgia    right ear      HPI: Patient is a 57 y.o. Caucasian female who is here for right ear pain/sinus pressure the last couple of days.  Has had 1 wk of right sided sinus HA--used afrin and nasonex as per Dr. Thurston Hole rec's.  A bit of lightheadedness on and off.  No fevers.  Some PND but not much cough. No SOB or chest tightness.  Pertinent PMH:  Past Medical History  Diagnosis Date  . Chronic rhinitis   . Health maintenance examination   . Hypothyroidism   . Chronic pelvic pain in female 1995    LLQ  . Menopausal symptoms   . Osteoporosis     Osteopenia  . Allergy   . Migraine     MEDS:  Outpatient Prescriptions Prior to Visit  Medication Sig Dispense Refill  . ibuprofen (ADVIL,MOTRIN) 200 MG tablet Take 200 mg by mouth 2 (two) times daily as needed for pain or headache.       . levothyroxine (SYNTHROID, LEVOTHROID) 75 MCG tablet Take 1 tablet (75 mcg total) by mouth daily.  45 tablet  1  . mometasone (NASONEX) 50 MCG/ACT nasal spray Place 2 sprays into the nose daily.  51 g  11  . norethindrone-ethinyl estradiol (JINTELI) 1-5 MG-MCG TABS TAKE 1 TABLET BY MOUTH EVERY DAY  28 tablet  12  . SUMAtriptan (IMITREX) 100 MG tablet Take one tablet every 2 hours at the onset of headache as needed for migraine.  24 tablet  1  . ondansetron (ZOFRAN ODT) 8 MG disintegrating tablet Take 1 tablet (8 mg total) by mouth every 8 (eight) hours as needed for nausea.  15 tablet  0  . valACYclovir (VALTREX) 500 MG tablet Take 1 tablet by mouth twice daily as needed.  30 tablet  2   No facility-administered medications prior to visit.    PE: Blood pressure 123/84, pulse 73, temperature 98.4 F (36.9 C), temperature source Temporal, resp. rate 18, height 5\' 3"  (1.6 m), weight 127 lb (57.607 kg), SpO2 99.00%. VS: noted--normal. Gen: alert, NAD, NONTOXIC APPEARING. HEENT:  eyes without injection, drainage, or swelling.  Ears: EACs clear, TMs with normal light reflex and landmarks.  Nose: Clear rhinorrhea, with some dried, crusty exudate adherent to mildly injected mucosa.  No purulent d/c.  Right maxillary, ethmoid, and frontal paranasal sinus TTP.  No facial swelling.  Throat and mouth without focal lesion.  No pharyngial swelling, erythema, or exudate.   Neck: supple, no LAD.   LUNGS: CTA bilat, nonlabored resps.   CV: RRR, no m/r/g. EXT: no c/c/e SKIN: no rash  IMPRESSION AND PLAN:  Sinusitis, acute Augmentin 875 mg bid x 10d. Continue current sinus regimen as directed by Dr. Sherene Sires, her pulmonologist. Saline nasal spray also discussed. Advil cold/sinus prn.   An After Visit Summary was printed and given to the patient.  FOLLOW UP: prn

## 2012-10-29 ENCOUNTER — Other Ambulatory Visit: Payer: Self-pay | Admitting: Gynecology

## 2012-10-29 DIAGNOSIS — M8580 Other specified disorders of bone density and structure, unspecified site: Secondary | ICD-10-CM

## 2012-11-01 ENCOUNTER — Other Ambulatory Visit: Payer: Self-pay | Admitting: Internal Medicine

## 2012-11-02 ENCOUNTER — Telehealth: Payer: Self-pay | Admitting: Nurse Practitioner

## 2012-11-02 ENCOUNTER — Encounter: Payer: Self-pay | Admitting: Nurse Practitioner

## 2012-11-16 ENCOUNTER — Other Ambulatory Visit: Payer: Self-pay | Admitting: Gynecology

## 2012-11-16 DIAGNOSIS — Z1231 Encounter for screening mammogram for malignant neoplasm of breast: Secondary | ICD-10-CM

## 2012-12-03 ENCOUNTER — Ambulatory Visit (INDEPENDENT_AMBULATORY_CARE_PROVIDER_SITE_OTHER): Payer: 59

## 2012-12-03 DIAGNOSIS — M8580 Other specified disorders of bone density and structure, unspecified site: Secondary | ICD-10-CM

## 2012-12-03 DIAGNOSIS — M899 Disorder of bone, unspecified: Secondary | ICD-10-CM

## 2012-12-04 ENCOUNTER — Other Ambulatory Visit: Payer: Self-pay | Admitting: *Deleted

## 2012-12-04 DIAGNOSIS — M8580 Other specified disorders of bone density and structure, unspecified site: Secondary | ICD-10-CM

## 2012-12-08 ENCOUNTER — Other Ambulatory Visit: Payer: 59

## 2012-12-08 DIAGNOSIS — M8580 Other specified disorders of bone density and structure, unspecified site: Secondary | ICD-10-CM

## 2012-12-09 ENCOUNTER — Ambulatory Visit (HOSPITAL_COMMUNITY): Payer: BC Managed Care – PPO

## 2012-12-10 LAB — PTH, INTACT AND CALCIUM: Calcium: 9.1 mg/dL (ref 8.4–10.5)

## 2012-12-15 ENCOUNTER — Telehealth: Payer: Self-pay | Admitting: *Deleted

## 2012-12-15 NOTE — Telephone Encounter (Signed)
Pt informed recent lab work on 12/08/12 was normal

## 2012-12-16 ENCOUNTER — Ambulatory Visit (HOSPITAL_COMMUNITY)
Admission: RE | Admit: 2012-12-16 | Discharge: 2012-12-16 | Disposition: A | Discharge status: Home / Self Care | Payer: 59 | Source: Ambulatory Visit | Attending: Gynecology | Admitting: Gynecology

## 2012-12-16 DIAGNOSIS — Z1231 Encounter for screening mammogram for malignant neoplasm of breast: Secondary | ICD-10-CM | POA: Insufficient documentation

## 2012-12-17 ENCOUNTER — Other Ambulatory Visit: Payer: Self-pay

## 2013-01-04 ENCOUNTER — Other Ambulatory Visit: Payer: Self-pay | Admitting: *Deleted

## 2013-01-04 DIAGNOSIS — E039 Hypothyroidism, unspecified: Secondary | ICD-10-CM

## 2013-01-04 MED ORDER — LEVOTHYROXINE SODIUM 75 MCG PO TABS: 75.0000 ug | ORAL_TABLET | Freq: Every day | ORAL | Status: DC

## 2013-01-28 ENCOUNTER — Other Ambulatory Visit: Payer: Self-pay | Admitting: Internal Medicine

## 2013-02-01 ENCOUNTER — Other Ambulatory Visit: Payer: Self-pay | Admitting: *Deleted

## 2013-02-01 DIAGNOSIS — G43909 Migraine, unspecified, not intractable, without status migrainosus: Secondary | ICD-10-CM

## 2013-02-02 MED ORDER — SUMATRIPTAN SUCCINATE 100 MG PO TABS: 100.0000 mg | ORAL_TABLET | Freq: Once | ORAL | Status: DC

## 2013-02-10 NOTE — Telephone Encounter (Signed)
Close Encounter 

## 2013-02-26 ENCOUNTER — Other Ambulatory Visit: Payer: Self-pay | Admitting: *Deleted

## 2013-02-26 DIAGNOSIS — E039 Hypothyroidism, unspecified: Secondary | ICD-10-CM

## 2013-02-26 MED ORDER — LEVOTHYROXINE SODIUM 75 MCG PO TABS: 75.0000 ug | ORAL_TABLET | Freq: Every day | ORAL | Status: DC

## 2013-03-05 ENCOUNTER — Other Ambulatory Visit: Payer: Self-pay

## 2013-03-05 DIAGNOSIS — Z79890 Hormone replacement therapy (postmenopausal): Secondary | ICD-10-CM

## 2013-03-05 MED ORDER — NORETHINDRONE-ETH ESTRADIOL 1-5 MG-MCG PO TABS: ORAL_TABLET | ORAL | Status: DC

## 2013-03-10 ENCOUNTER — Encounter: Payer: Self-pay | Admitting: Nurse Practitioner

## 2013-03-10 ENCOUNTER — Ambulatory Visit (INDEPENDENT_AMBULATORY_CARE_PROVIDER_SITE_OTHER): Payer: 59 | Admitting: Nurse Practitioner

## 2013-03-10 VITALS — BP 128/90 | HR 81 | Temp 98.4°F | Resp 18 | Ht 63.0 in | Wt 132.0 lb

## 2013-03-10 DIAGNOSIS — E039 Hypothyroidism, unspecified: Secondary | ICD-10-CM

## 2013-03-10 DIAGNOSIS — J309 Allergic rhinitis, unspecified: Secondary | ICD-10-CM

## 2013-03-10 MED ORDER — MOMETASONE FUROATE 50 MCG/ACT NA SUSP: 2.0000 | Freq: Every day | NASAL | Status: DC

## 2013-03-10 NOTE — Progress Notes (Signed)
Pre visit review using our clinic review tool, if applicable. No additional management support is needed unless otherwise documented below in the visit note. 

## 2013-03-10 NOTE — Patient Instructions (Signed)
Our office will call with lab results and any changes in medications. Discuss hormone replacement therapy with your gynecologist and other treatments like low dose lexapro for vasomotor symptoms like "hot flashes". Consider rinsing sinuses daily with saline irrigation for better management of allergic rhinitis. Great to see you!  Hormone Therapy At menopause, your body begins making less estrogen and progesterone hormones. This causes the body to stop having menstrual periods. This is because estrogen and progesterone hormones control your periods and menstrual cycle. A lack of estrogen may cause symptoms such as:  Hot flushes (or hot flashes).  Vaginal dryness.  Dry skin.  Loss of sex drive.  Risk of bone loss (osteoporosis). When this happens, you may choose to take hormone therapy to get back the estrogen lost during menopause. When the hormone estrogen is given alone, it is usually referred to as ET (Estrogen Therapy). When the hormone progestin is combined with estrogen, it is generally called HT (Hormone Therapy). This was formerly known as hormone replacement therapy (HRT). Your caregiver can help you make a decision on what will be best for you. The decision to use HT seems to change often as new studies are done. Many studies do not agree on the benefits of hormone replacement therapy. LIKELY BENEFITS OF HT INCLUDE PROTECTION FROM:  Hot Flushes (also called hot flashes) - A hot flush is a sudden feeling of heat that spreads over the face and body. The skin may redden like a blush. It is connected with sweats and sleep disturbance. Women going through menopause may have hot flushes a few times a month or several times per day depending on the woman.  Osteoporosis (bone loss)- Estrogen helps guard against bone loss. After menopause, a woman's bones slowly lose calcium and become weak and brittle. As a result, bones are more likely to break. The hip, wrist, and spine are affected most often.  Hormone therapy can help slow bone loss after menopause. Weight bearing exercise and taking calcium with vitamin D also can help prevent bone loss. There are also medications that your caregiver can prescribe that can help prevent osteoporosis.  Vaginal Dryness - Loss of estrogen causes changes in the vagina. Its lining may become thin and dry. These changes can cause pain and bleeding during sexual intercourse. Dryness can also lead to infections. This can cause burning and itching. (Vaginal estrogen treatment can help relieve pain, itching, and dryness.)  Urinary Tract Infections are more common after menopause because of lack of estrogen. Some women also develop urinary incontinence because of low estrogen levels in the vagina and bladder.  Possible other benefits of estrogen include a positive effect on mood and short-term memory in women. RISKS AND COMPLICATIONS  Using estrogen alone without progesterone causes the lining of the uterus to grow. This increases the risk of lining of the uterus (endometrial) cancer. Your caregiver should give another hormone called progestin if you have a uterus.  Women who take combined (estrogen and progestin) HT appear to have an increased risk of breast cancer. The risk appears to be small, but increases throughout the time that HT is taken.  Combined therapy also makes the breast tissue slightly denser which makes it harder to read mammograms (breast X-rays).  Combined, estrogen and progesterone therapy can be taken together every day, in which case there may be spotting of blood. HT therapy can be taken cyclically in which case you will have menstrual periods. Cyclically means HT is taken for a set amount of  days, then not taken, then this process is repeated.  HT may increase the risk of stroke, heart attack, breast cancer and forming blood clots in your leg.  Transdermal estrogen (estrogen that is absorbed through the skin with a patch or a cream) may  have more positive results with:  Cholesterol.  Blood pressure.  Blood clots. Having the following conditions may indicate you should not have HT:  Endometrial cancer.  Liver disease.  Breast cancer.  Heart disease.  History of blood clots.  Stroke. TREATMENT   If you choose to take HT and have a uterus, usually estrogen and progestin are prescribed.  Your caregiver will help you decide the best way to take the medications.  Possible ways to take estrogen include:  Pills.  Patches.  Gels.  Sprays.  Vaginal estrogen cream, rings and tablets.  It is best to take the lowest dose possible that will help your symptoms and take them for the shortest period of time that you can.  Hormone therapy can help relieve some of the problems (symptoms) that affect women at menopause. Before making a decision about HT, talk to your caregiver about what is best for you. Be well informed and comfortable with your decisions. HOME CARE INSTRUCTIONS   Follow your caregivers advice when taking the medications.  A Pap test is done to screen for cervical cancer.  The first Pap test should be done at age 16.  Between ages 14 and 61, Pap tests are repeated every 2 years.  Beginning at age 66, you are advised to have a Pap test every 3 years as long as your past 3 Pap tests have been normal.  Some women have medical problems that increase the chance of getting cervical cancer. Talk to your caregiver about these problems. It is especially important to talk to your caregiver if a new problem develops soon after your last Pap test. In these cases, your caregiver may recommend more frequent screening and Pap tests.  The above recommendations are the same for women who have or have not gotten the vaccine for HPV (Human Papillomavirus).  If you had a hysterectomy for a problem that was not a cancer or a condition that could lead to cancer, then you no longer need Pap tests. However, even if  you no longer need a Pap test, a regular exam is a good idea to make sure no other problems are starting.   If you are between ages 63 and 71, and you have had normal Pap tests going back 10 years, you no longer need Pap tests. However, even if you no longer need a Pap test, a regular exam is a good idea to make sure no other problems are starting.   If you have had past treatment for cervical cancer or a condition that could lead to cancer, you need Pap tests and screening for cancer for at least 20 years after your treatment.  If Pap tests have been discontinued, risk factors (such as a new sexual partner) need to be re-assessed to determine if screening should be resumed.  Some women may need screenings more often if they are at high risk for cervical cancer.  Get mammograms done as per the advice of your caregiver. SEEK IMMEDIATE MEDICAL CARE IF:  You develop abnormal vaginal bleeding.  You have pain or swelling in your legs, shortness of breath, or chest pain.  You develop dizziness or headaches.  You have lumps or changes in your breasts or armpits.  You have slurred speech.  You develop weakness or numbness of your arms or legs.  You have pain, burning, or bleeding when urinating.  You develop abdominal pain. Document Released: 10/27/2002 Document Revised: 04/22/2011 Document Reviewed: 02/14/2010 [facility] Patient Information 2014 [place].

## 2013-03-10 NOTE — Progress Notes (Signed)
Subjective:     Heather Chambers is a 58 y.o. female and is here for a follow up of hypothyroidism. Her dose was decreased about 6 mos ago. She denies palpitations, cold/heat intolerance, and skin changes, but reports some recent "hot flashes". She is on HRT prescribed by her gynecologist and has had no recent changes with medication.  Regarding allergic rhinitis, pt has been using nasonex more than usual lately due to nasal stuffiness.  History   Social History  . Marital Status: Married    Spouse Name: N/A    Number of Children: 1  . Years of Education: N/A   Occupational History  .  [employer]   Social History Main Topics  . Smoking status: Never Smoker   . Smokeless tobacco: Never Used  . Alcohol Use: 2.4 oz/week    4 Cans of beer per week  . Drug Use: Not on file  . Sexual Activity: Yes    Birth Control/ Protection: Post-menopausal   Other Topics Concern  . Not on file   Social History Narrative   Heather Chambers lives with her husband. She has a grown son who [place] moved to [place] a [place]. She worked for the [employer] for 30 years, was laid off last year. She will be starting a new job in a few weeks.   Health Maintenance  Topic Date Due  . Colonoscopy  09/27/2007  . Influenza Vaccine  09/11/2012  . Mammogram  10/17/2013  . Pap Smear  10/17/2015  . Tetanus/tdap  02/25/2019    The following portions of the patient's history were reviewed and updated as appropriate: allergies, current medications, past medical history, past social history and problem list.  Review of Systems   Constitutional: negative for fatigue, night sweats and reports "hot flashes" Eyes: negative for visual disturbance Ears, nose, mouth, throat, and face: positive for nasal stuffiness Cardiovascular: negative for irregular heart beat and lower extremity edema Gastrointestinal: negative for abdominal pain and change in bowel habits Genitourinary:postmenopausal, using  HRT Integument/breast: has noticed hair cycling-seems to come out then grows back Endocrine: negative except for hot flashes, . Allergic/Immunologic: positive for allergic rhinitis   Objective:  Const: no distress, well groomed, talks fast. Head: atraumatic Eyes: lids & lashes clear, sclera clear Neck: no thyromegaly Or LAD Lungs: clear to asucultation Heart: RRR, no murmur    Assessment:  1 hypothyroidism. Taking 75 mcg qd. Having recent hotflashes. 2 allergic rhinitis, using nasonex & saline spray.  Plan:      1 TSH, free T4. 2 Add saline rinses to regimen. See After Visit Summary for Counseling Recommendations

## 2013-03-11 LAB — TSH: TSH: 4.59 u[IU]/mL (ref 0.35–5.50)

## 2013-03-11 LAB — T4, FREE: Free T4: 0.85 ng/dL (ref 0.60–1.60)

## 2013-03-12 ENCOUNTER — Other Ambulatory Visit: Payer: Self-pay | Admitting: Nurse Practitioner

## 2013-03-12 DIAGNOSIS — E039 Hypothyroidism, unspecified: Secondary | ICD-10-CM

## 2013-03-12 MED ORDER — LEVOTHYROXINE SODIUM 88 MCG PO TABS: 88.0000 ug | ORAL_TABLET | Freq: Every day | ORAL | Status: DC

## 2013-03-12 NOTE — Telephone Encounter (Signed)
Pt aware of need for 6 week lab follow up.

## 2013-03-12 NOTE — Telephone Encounter (Signed)
Spoke to patient's husband, who said that he would have patient call us back before 5 today to let us know where to send it.

## 2013-04-27 ENCOUNTER — Ambulatory Visit (INDEPENDENT_AMBULATORY_CARE_PROVIDER_SITE_OTHER): Payer: 59 | Admitting: Nurse Practitioner

## 2013-04-27 VITALS — BP 131/84 | HR 70 | Temp 98.6°F | Resp 18 | Ht 63.0 in | Wt 132.0 lb

## 2013-04-27 DIAGNOSIS — J310 Chronic rhinitis: Secondary | ICD-10-CM

## 2013-04-27 DIAGNOSIS — E039 Hypothyroidism, unspecified: Secondary | ICD-10-CM

## 2013-04-27 DIAGNOSIS — J309 Allergic rhinitis, unspecified: Secondary | ICD-10-CM

## 2013-04-27 MED ORDER — MOMETASONE FUROATE 50 MCG/ACT NA SUSP: 2.0000 | Freq: Every day | NASAL | Status: DC

## 2013-04-27 NOTE — Progress Notes (Signed)
Subjective:     Heather Chambers is a 58 y.o. female who presents for follow up of hypothyroidism. Feels better since increased dose from 75 mcg to 88 mcg-less fatigue, no hot flashes.  Requests refill for nasonex in anticipation of allergy season.  The following portions of the patient's history were reviewed and updated as appropriate: allergies, current medications, past medical history, past surgical history and problem list.  Review of Systems Pertinent items are noted in HPI.    Objective:    BP 131/84  Pulse 70  Temp(Src) 98.6 F (37 C) (Temporal)  Resp 18  Ht 5\' 3"  (1.6 m)  Wt 132 lb (59.875 kg)  BMI 23.39 kg/m2  SpO2 98% General appearance: alert, cooperative, appears stated age and no distress Head: Normocephalic, without obvious abnormality, atraumatic Eyes: negative findings: lids and lashes normal and conjunctivae and sclerae normal Heart: regular rate and rhythm, S1, S2 normal, no murmur, click, rub or gallop  Laboratory: Lab Results  Component Value Date   TSH 4.59 03/10/2013      Assessment:    Hypothyroidism.  Replacement synthroid 88 mcg.   Seasonal allergies.  Plan:    1. L-thyroxine per orders. TSH today. Follow to be determined by labs  2 refill nasonex.

## 2013-04-27 NOTE — Assessment & Plan Note (Signed)
Increased to 88 mcg from 75 mcg last visit. Feeling better, more energy, no more hot flashes. Will check TSH today.

## 2013-04-27 NOTE — Progress Notes (Signed)
Pre visit review using our clinic review tool, if applicable. No additional management support is needed unless otherwise documented below in the visit note. 

## 2013-04-27 NOTE — Patient Instructions (Signed)
I will refill synthroid once labs are back-should have it tomorrow. Nice to see you!  Levothyroxine tablets What is this medicine? LEVOTHYROXINE (lee voe thye ROX een) is a thyroid hormone. This medicine can improve symptoms of thyroid deficiency such as slow speech, lack of energy, weight gain, hair loss, dry skin, and feeling cold. It also helps to treat goiter (an enlarged thyroid gland). It is also used to treat some kinds of thyroid cancer along with surgery and other medicines. This medicine may be used for other purposes; ask your health care provider or pharmacist if you have questions. COMMON BRAND NAME(S): Estre , Levo-T, Levothroid, Levoxyl, Synthroid, Thyro-Tabs, Unithroid What should I tell my health care provider before I take this medicine? They need to know if you have any of these conditions: -angina -blood clotting problems -diabetes -dieting or on a weight loss program -fertility problems -heart disease -high levels of thyroid hormone -pituitary gland problem -previous heart attack -an unusual or allergic reaction to levothyroxine, thyroid hormones, other medicines, foods, dyes, or preservatives -pregnant or trying to get pregnant -breast-feeding How should I use this medicine? Take this medicine by mouth with plenty of water. It is best to take on an empty stomach, at least 30 minutes before or 2 hours after food. Follow the directions on the prescription label. Take at the same time each day. Do not take your medicine more often than directed. Contact your pediatrician regarding the use of this medicine in children. While this drug may be prescribed for children and infants as young as a few days of age for selected conditions, precautions do apply. For infants, you may crush the tablet and place in a small amount of (5-10 ml or 1 to 2 teaspoonfuls) of water, breast milk, or non-soy based infant formula. Do not mix with soy-based infant formula. Give as  directed. Overdosage: If you think you have taken too much of this medicine contact a poison control center or emergency room at once. NOTE: This medicine is only for you. Do not share this medicine with others. What if I miss a dose? If you miss a dose, take it as soon as you can. If it is almost time for your next dose, take only that dose. Do not take double or extra doses. What may interact with this medicine? -amiodarone -antacids -anti-thyroid medicines -calcium supplements -carbamazepine -cholestyramine -colestipol -digoxin -female hormones, including contraceptive or birth control pills -iron supplements -ketamine -liquid nutrition products like Ensure -medicines for colds and breathing difficulties -medicines for diabetes -medicines for mental depression -medicines or herbals used to decrease weight or appetite -phenobarbital or other barbiturate medications -phenytoin -prednisone or other corticosteroids -rifabutin -rifampin -soy isoflavones -sucralfate -theophylline -warfarin This list may not describe all possible interactions. Give your health care provider a list of all the medicines, herbs, non-prescription drugs, or dietary supplements you use. Also tell them if you smoke, drink alcohol, or use illegal drugs. Some items may interact with your medicine. What should I watch for while using this medicine? Be sure to take this medicine with plenty of fluids. Some tablets may cause choking, gagging, or difficulty swallowing from the tablet getting stuck in your throat. Most of these problems disappear if the medicine is taken with the right amount of water or other fluids. Do not switch brands of this medicine unless your health care professional agrees with the change. Ask questions if you are uncertain. You will need regular exams and occasional blood tests to check the response  to treatment. If you are receiving this medicine for an underactive thyroid, it may be  several weeks before you notice an improvement. Check with your doctor or health care professional if your symptoms do not improve. It may be necessary for you to take this medicine for the rest of your life. Do not stop using this medicine unless your doctor or health care professional advises you to. This medicine can affect blood sugar levels. If you have diabetes, check your blood sugar as directed. You may lose some of your hair when you first start treatment. With time, this usually corrects itself. If you are going to have surgery, tell your doctor or health care professional that you are taking this medicine. What side effects may I notice from receiving this medicine? Side effects that you should report to your doctor or health care professional as soon as possible: -allergic reactions like skin rash, itching or hives, swelling of the face, lips, or tongue -chest pain -excessive sweating or intolerance to heat -fast or irregular heartbeat -nervousness -skin rash or hives -swelling of ankles, feet, or legs -tremors Side effects that usually do not require medical attention (report to your doctor or health care professional if they continue or are bothersome): -changes in appetite -changes in menstrual periods -diarrhea -hair loss -headache -trouble sleeping -weight loss This list may not describe all possible side effects. Call your doctor for medical advice about side effects. You may report side effects to FDA at 1-800-FDA-1088. Where should I keep my medicine? Keep out of the reach of children. Store at room temperature between 15 and 30 degrees C (59 and 86 degrees F). Protect from light and moisture. Keep container tightly closed. Throw away any unused medicine after the expiration date. NOTE: This sheet is a summary. It may not cover all possible information. If you have questions about this medicine, talk to your doctor, pharmacist, or health care provider.  2014,  Elsevier/[employer] Standard. (2008-05-06 14:28:07)

## 2013-04-27 NOTE — Assessment & Plan Note (Signed)
Refill nasonex today.

## 2013-04-28 LAB — TSH: TSH: 1.51 u[IU]/mL (ref 0.35–5.50)

## 2013-04-29 ENCOUNTER — Other Ambulatory Visit: Payer: Self-pay | Admitting: Nurse Practitioner

## 2013-04-29 DIAGNOSIS — E039 Hypothyroidism, unspecified: Secondary | ICD-10-CM

## 2013-05-11 ENCOUNTER — Other Ambulatory Visit: Payer: Self-pay | Admitting: *Deleted

## 2013-05-11 ENCOUNTER — Other Ambulatory Visit: Payer: Self-pay | Admitting: Family Medicine

## 2013-05-11 DIAGNOSIS — E039 Hypothyroidism, unspecified: Secondary | ICD-10-CM

## 2013-05-11 MED ORDER — LEVOTHYROXINE SODIUM 88 MCG PO TABS: 88.0000 ug | ORAL_TABLET | Freq: Every day | ORAL | Status: DC

## 2013-05-11 NOTE — Telephone Encounter (Signed)
X printed and faxed to [facility] w/ confirmation.

## 2013-06-08 ENCOUNTER — Other Ambulatory Visit (INDEPENDENT_AMBULATORY_CARE_PROVIDER_SITE_OTHER): Payer: 59

## 2013-06-08 DIAGNOSIS — E039 Hypothyroidism, unspecified: Secondary | ICD-10-CM

## 2013-06-09 ENCOUNTER — Telehealth: Payer: Self-pay | Admitting: Nurse Practitioner

## 2013-06-09 LAB — TSH: TSH: 0.97 u[IU]/mL (ref 0.35–5.50)

## 2013-06-09 NOTE — Telephone Encounter (Signed)
pls call pt: Advise tsh looks good, check again in 3 mos, if stable, next lab in 6 mos.

## 2013-06-10 NOTE — Telephone Encounter (Signed)
Left detailed message on pt's cell. Per DPR.

## 2013-07-12 ENCOUNTER — Ambulatory Visit (INDEPENDENT_AMBULATORY_CARE_PROVIDER_SITE_OTHER): Payer: 59 | Admitting: Nurse Practitioner

## 2013-07-12 ENCOUNTER — Encounter: Payer: Self-pay | Admitting: Nurse Practitioner

## 2013-07-12 VITALS — BP 131/84 | HR 83 | Temp 98.2°F | Ht 63.0 in | Wt 131.0 lb

## 2013-07-12 DIAGNOSIS — R059 Cough, unspecified: Secondary | ICD-10-CM | POA: Insufficient documentation

## 2013-07-12 DIAGNOSIS — R05 Cough: Secondary | ICD-10-CM

## 2013-07-12 DIAGNOSIS — J069 Acute upper respiratory infection, unspecified: Secondary | ICD-10-CM

## 2013-07-12 MED ORDER — BENZONATATE 100 MG PO CAPS: ORAL_CAPSULE | ORAL | Status: DC

## 2013-07-12 MED ORDER — HYDROCOD POLST-CHLORPHEN POLST 10-8 MG/5ML PO LQCR: 5.0000 mL | Freq: Every evening | ORAL | Status: DC | PRN

## 2013-07-12 NOTE — Patient Instructions (Signed)
You have a viral illness that has caused bronchitis. You may cough for 3-4 weeks. For cough, you may use benzonatate capsules as needed for cough. Start daily sinus rinses (Neilmed Sinus rinse) to decrease post-nasal drip, which can contribute to cough. For sore throat use benzocaine throat lozenges. Use cough syrup for night time. Please call for re-evaluation if you are not improving or develop chest pain with inspiration or fever.  Acute Bronchitis Bronchitis is inflammation of the airways that extend from the windpipe into the lungs (bronchi). The inflammation often causes mucus to develop. This leads to a cough, which is the most common symptom of bronchitis.  In acute bronchitis, the condition usually develops suddenly and goes away over time, usually in a couple weeks. Smoking, allergies, and asthma can make bronchitis worse. Repeated episodes of bronchitis may cause further lung problems.  CAUSES Acute bronchitis is most often caused by the same virus that causes a cold. The virus can spread from person to person (contagious).  SIGNS AND SYMPTOMS   Cough.   Fever.   Coughing up mucus.   Body aches.   Chest congestion.   Chills.   Shortness of breath.   Sore throat.  DIAGNOSIS  Acute bronchitis is usually diagnosed through a physical exam. Tests, such as chest X-rays, are sometimes done to rule out other conditions.  TREATMENT  Acute bronchitis usually goes away in a couple weeks. Often times, no medical treatment is necessary. Medicines are sometimes given for relief of fever or cough. Antibiotics are usually not needed but may be prescribed in certain situations. In some cases, an inhaler may be recommended to help reduce shortness of breath and control the cough. A cool mist vaporizer may also be used to help thin bronchial secretions and make it easier to clear the chest.  HOME CARE INSTRUCTIONS  Get plenty of rest.   Drink enough fluids to keep your urine clear or  pale yellow (unless you have a medical condition that requires fluid restriction). Increasing fluids may help thin your secretions and will prevent dehydration.   Only take over-the-counter or prescription medicines as directed by your health care provider.   Avoid smoking and secondhand smoke. Exposure to cigarette smoke or irritating chemicals will make bronchitis worse. If you are a smoker, consider using nicotine gum or skin patches to help control withdrawal symptoms. Quitting smoking will help your lungs heal faster.   Reduce the chances of another bout of acute bronchitis by washing your hands frequently, avoiding people with cold symptoms, and trying not to touch your hands to your mouth, nose, or eyes.   Follow up with your health care provider as directed.  SEEK MEDICAL CARE IF: Your symptoms do not improve after 1 week of treatment.  SEEK IMMEDIATE MEDICAL CARE IF:  You develop an increased fever or chills.   You have chest pain.   You have severe shortness of breath.  You have bloody sputum.   You develop dehydration.  You develop fainting.  You develop repeated vomiting.  You develop a severe headache. MAKE SURE YOU:   Understand these instructions.  Will watch your condition.  Will get help right away if you are not doing well or get worse. Document Released: 03/07/2004 Document Revised: 09/30/2012 Document Reviewed: 07/21/2012 [facility] Patient Information 2014 [place], [place].

## 2013-07-12 NOTE — Progress Notes (Signed)
Pre visit review using our clinic review tool, if applicable. No additional management support is needed unless otherwise documented below in the visit note. 

## 2013-07-12 NOTE — Progress Notes (Signed)
   Subjective:    Patient ID: Heather Chambers, female    DOB: 1955/07/04, 58 y.o.   MRN: [id number]  URI  This is a new problem. The current episode started 1 to 4 weeks ago (1 week). The problem has been gradually improving. There has been no fever. Associated symptoms include congestion and coughing. Pertinent negatives include no abdominal pain, chest pain (c/o heavy sensation in chest), diarrhea, dysuria, ear pain, headaches, nausea, plugged ear sensation, sinus pain, sore throat (resolved), vomiting or wheezing. Treatments tried: OTC mucolytic. The treatment provided no relief.      Review of Systems  Constitutional: Positive for fatigue. Negative for fever, chills, activity change and appetite change.  HENT: Positive for congestion, postnasal drip and voice change (hoarse). Negative for ear pain and sore throat (resolved).   Eyes: Negative for redness.  Respiratory: Positive for cough. Negative for chest tightness, shortness of breath and wheezing.   Cardiovascular: Negative for chest pain (c/o heavy sensation in chest).  Gastrointestinal: Negative for nausea, vomiting, abdominal pain and diarrhea.  Genitourinary: Negative for dysuria.  Musculoskeletal: Negative for back pain.  Neurological: Negative for headaches.       Objective:   Physical Exam  Vitals reviewed. Constitutional: She is oriented to person, place, and time. She appears well-developed and well-nourished. No distress.  HENT:  Head: Normocephalic and atraumatic.  Right Ear: External ear normal.  Left Ear: External ear normal.  Mouth/Throat: Oropharynx is clear and moist. No oropharyngeal exudate.  LTM vessels injected, bones visible bilat.  Eyes: Conjunctivae are normal. Right eye exhibits no discharge. Left eye exhibits no discharge.  Neck: Normal range of motion. No thyromegaly present.  Cardiovascular: Normal rate, regular rhythm and normal heart sounds.   No murmur heard. Pulmonary/Chest: Effort normal and  breath sounds normal. No respiratory distress. She has no wheezes. She has no rales.  Lymphadenopathy:    She has no cervical adenopathy.  Neurological: She is alert and oriented to person, place, and time.  Skin: Skin is warm.  Mildly diaphoretic  Psychiatric: She has a normal mood and affect. Her behavior is normal. Thought content normal.          Assessment & Plan:  1. Cough  - chlorpheniramine-HYDROcodone (TUSSIONEX PENNKINETIC ER) 10-8 MG/5ML LQCR; Take 5 mLs by mouth at bedtime as needed for cough.  Dispense: 115 mL; Refill: 0 - benzonatate (TESSALON) 100 MG capsule; Take 1-2 capsules po up to 3 times daily PRN cough  Dispense: 60 capsule; Refill: 0  2. Viral upper respiratory illness  See pt instructions.

## 2013-09-11 ENCOUNTER — Other Ambulatory Visit: Payer: Self-pay | Admitting: Women's Health

## 2013-10-19 ENCOUNTER — Other Ambulatory Visit: Payer: Self-pay | Admitting: Women's Health

## 2013-10-28 ENCOUNTER — Telehealth: Payer: Self-pay | Admitting: Nurse Practitioner

## 2013-10-28 ENCOUNTER — Other Ambulatory Visit: Payer: Self-pay

## 2013-10-28 DIAGNOSIS — E039 Hypothyroidism, unspecified: Secondary | ICD-10-CM

## 2013-10-28 MED ORDER — LEVOTHYROXINE SODIUM 88 MCG PO TABS: 88.0000 ug | ORAL_TABLET | Freq: Every day | ORAL | Status: DC

## 2013-10-28 NOTE — Telephone Encounter (Signed)
Refill sent.

## 2013-10-28 NOTE — Telephone Encounter (Signed)
Caller name: Deane Relation to pt: self Call back number: [phone] Pharmacy: mail order pharmacy through [facility]  Reason for call:   Patient is requesting a refill of synthroid(name brand) to be sent to mail order pharmacy

## 2013-11-02 ENCOUNTER — Telehealth: Payer: Self-pay

## 2013-11-02 NOTE — Telephone Encounter (Signed)
have never seen her for physical. I think she sees gynecology for [facility].  She needs Vit D, CMet, CBC, TSH, lipids checked by me or gynecology. These labs should be done with an OV.   OK to fill 30 days on synthroid if she is running out & cannot schedule appt right away.        I called pt, LMOM to call back/

## 2013-11-03 ENCOUNTER — Encounter: Payer: Self-pay | Admitting: Nurse Practitioner

## 2013-11-03 ENCOUNTER — Ambulatory Visit (INDEPENDENT_AMBULATORY_CARE_PROVIDER_SITE_OTHER): Payer: 59 | Admitting: Nurse Practitioner

## 2013-11-03 VITALS — BP 122/76 | HR 84 | Temp 98.3°F | Resp 18 | Ht 63.0 in | Wt 130.0 lb

## 2013-11-03 DIAGNOSIS — Z0000 Encounter for general adult medical examination without abnormal findings: Secondary | ICD-10-CM

## 2013-11-03 DIAGNOSIS — E039 Hypothyroidism, unspecified: Secondary | ICD-10-CM

## 2013-11-03 DIAGNOSIS — R51 Headache: Secondary | ICD-10-CM

## 2013-11-03 DIAGNOSIS — J309 Allergic rhinitis, unspecified: Secondary | ICD-10-CM

## 2013-11-03 DIAGNOSIS — R519 Headache, unspecified: Secondary | ICD-10-CM

## 2013-11-03 MED ORDER — PREDNISONE 10 MG PO TABS: ORAL_TABLET | ORAL | Status: DC

## 2013-11-03 MED ORDER — AZELASTINE HCL 0.1 % NA SOLN: 1.0000 | Freq: Two times a day (BID) | NASAL | Status: DC

## 2013-11-03 NOTE — Telephone Encounter (Signed)
Pt has appt today

## 2013-11-03 NOTE — Progress Notes (Signed)
Subjective:     Heather Chambers is a 58 y.o. female and is here for a comprehensive physical exam. She received womancare for Dr Ardyth Harps, she has appt. Scheduled next mo. Last Pap was last year & nml. She is treated for hypothyroidism and chronic rhinitis w.frequent sinusitis. She exercises regularly, keeps weight in nml range. The patient reports problems - sinus headache. She saw Dr Sherene Sires for many years for treatment of frequent sinusitis. She says regimen of nasonex worked well. She has not been able to get ins coverage, so she switched to flonase & nasocort. 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 She occasionally uses sinus rinses. She has episodes of waking with sinus pressure that prevents exercise & decreases QOL. Last epoisode was last spring. She took some prednisone her husband was using for shoulder pain-she got a lot of relief from sinus pressure. She wonders what else she can do to avoid intense sinus HA flares. She brings in form from employer . It was signed, copied, & mailed back to her.  History   Social History  . Marital Status: Married    Spouse Name: N/A    Number of Children: 1  . Years of Education: N/A   Occupational History  .  [employer]   Social History Main Topics  . Smoking status: Never Smoker   . Smokeless tobacco: Never Used  . Alcohol Use: 2.4 oz/week    4 Cans of beer per week  . Drug Use: Not on file  . Sexual Activity: Yes    Birth Control/ Protection: Post-menopausal   Other Topics Concern  . Not on file   Social History Narrative   Ms. Kauer lives with her husband. She has a grown son who recently moved to [place] for a job. She worked for the [employer] for 30 years, was laid off last year. She will be starting a new job in a few weeks.   Health Maintenance  Topic Date Due  . Colonoscopy  09/27/2007  . Influenza Vaccine  09/11/2013  . Mammogram  10/17/2013  . Pap Smear  10/17/2015  .  Tetanus/tdap  02/25/2019    The following portions of the patient's history were reviewed and updated as appropriate: allergies, current medications, past medical history, past social history, past surgical history and problem list.  Review of Systems Pertinent items are noted in HPI.   Objective:    BP 122/76  Pulse 84  Temp(Src) 98.3 F (36.8 C) (Oral)  Resp 18  Ht  (1.6 m)  Wt 130 lb (58.968 kg)  BMI 23.03 kg/m2  SpO2 94% General appearance: alert, cooperative, appears stated age and no distress Head: Normocephalic, without obvious abnormality, atraumatic Eyes: negative findings: lids and lashes normal and conjunctivae and sclerae normal Ears: normal TM's and external ear canals both ears Throat: lips, mucosa, and tongue normal; teeth and gums normal Neck: no adenopathy, no carotid bruit, supple, symmetrical, trachea midline and thyroid not enlarged, symmetric, no tenderness/mass/nodules Lungs: clear to auscultation bilaterally Heart: regular rate and rhythm, S1, S2 normal, no murmur, click, rub or gallop Abdomen: soft, non-tender; bowel sounds normal; no masses,  no organomegaly Extremities: extremities normal, atraumatic, no cyanosis or edema Pulses: 2+ and symmetric Lymph nodes: Cervical, supraclavicular, and axillary nodes normal.    Assessment:   1. Preventative health care - CBC with Differential - Comprehensive metabolic panel - Lipid panel - Vit D  25 hydroxy (  rtn osteoporosis monitoring) - Urine Microscopic  2. Unspecified hypothyroidism - TSH Continue current dose 88 mcg synthroid  3. Chronic allergic rhinitis - azelastine (ASTELIN) 0.1 % nasal spray; Place 1 spray into both nostrils 2 (two) times daily. Use in each nostril as directed  Dispense: 30 mL; Refill: 6 Continue nasacort Daily sinus rinse Ref to allergist if no improvement.  4. Sinus headache - predniSONE (DELTASONE) 10 MG tablet; Take 1 pill qam for 3 days PRN sinus Headache.  Dispense:  9 tablet; Refill: 1  See pt instructions F/u 4 weeks

## 2013-11-03 NOTE — Progress Notes (Signed)
Pre visit review using our clinic review tool, if applicable. No additional management support is needed unless otherwise documented below in the visit note. 

## 2013-11-03 NOTE — Patient Instructions (Signed)
For allergies: sinus rinse every day before nasal sprays.  Start astelin twice daily- 1-2 sprays each nare. Continue nasacort daily. When sinus pressure is unrelenting, take prednisone 1 tablet in morning for 1 to 3 days.  See allergist. You may have food as well as environmental triggers. Keep journal of food intake & symptoms-you may see a correlation.    Keep appointment with gynecology.  We will call with lab results.  Continue exercise 5 days/week.  Nice to see you!

## 2013-11-04 LAB — COMPREHENSIVE METABOLIC PANEL
ALBUMIN: 4.3 g/dL (ref 3.5–5.2)
ALT: 17 U/L (ref 0–35)
AST: 17 U/L (ref 0–37)
Alkaline Phosphatase: 29 U/L — ABNORMAL LOW (ref 39–117)
BUN: 16 mg/dL (ref 6–23)
CALCIUM: 8.5 mg/dL (ref 8.4–10.5)
CHLORIDE: 98 meq/L (ref 96–112)
CO2: 25 mEq/L (ref 19–32)
CREATININE: 1 mg/dL (ref 0.4–1.2)
GFR: 62.37 mL/min (ref >60.00)
GLUCOSE: 83 mg/dL (ref 70–99)
POTASSIUM: 3.9 meq/L (ref 3.5–5.1)
Sodium: 128 mEq/L — ABNORMAL LOW (ref 135–145)
Total Bilirubin: 0.3 mg/dL (ref 0.2–1.2)
Total Protein: 6.7 g/dL (ref 6.0–8.3)

## 2013-11-04 LAB — LIPID PANEL
Cholesterol: 146 mg/dL (ref 0–200)
HDL: 54.9 mg/dL (ref >39.00)
LDL Cholesterol: 78 mg/dL (ref 0–99)
NonHDL: 91.1
TRIGLYCERIDES: 64 mg/dL (ref 0.0–149.0)
Total CHOL/HDL Ratio: 3
VLDL: 12.8 mg/dL (ref 0.0–40.0)

## 2013-11-04 LAB — CBC WITH DIFFERENTIAL/PLATELET
BASOS PCT: 0.4 % (ref 0.0–3.0)
Basophils Absolute: 0 10*3/uL (ref 0.0–0.1)
Eosinophils Absolute: 0.1 10*3/uL (ref 0.0–0.7)
Eosinophils Relative: 1.2 % (ref 0.0–5.0)
HCT: 38.1 % (ref 36.0–46.0)
HEMOGLOBIN: 12.7 g/dL (ref 12.0–15.0)
LYMPHS PCT: 35.9 % (ref 12.0–46.0)
Lymphs Abs: 2.6 10*3/uL (ref 0.7–4.0)
MCHC: 33.3 g/dL (ref 30.0–36.0)
MCV: 91.2 fl (ref 78.0–100.0)
MONO ABS: 0.4 10*3/uL (ref 0.1–1.0)
Monocytes Relative: 6.1 % (ref 3.0–12.0)
NEUTROS ABS: 4.1 10*3/uL (ref 1.4–7.7)
Neutrophils Relative %: 56.4 % (ref 43.0–77.0)
Platelets: 297 10*3/uL (ref 150.0–400.0)
RBC: 4.18 Mil/uL (ref 3.87–5.11)
RDW: 14 % (ref 11.5–15.5)
WBC: 7.2 10*3/uL (ref 4.0–10.5)

## 2013-11-04 LAB — URINALYSIS, MICROSCOPIC ONLY

## 2013-11-04 LAB — TSH: TSH: 7.75 u[IU]/mL — AB (ref 0.35–4.50)

## 2013-11-04 LAB — VITAMIN D 25 HYDROXY (VIT D DEFICIENCY, FRACTURES): VITD: 31.76 ng/mL (ref 30.00–100.00)

## 2013-11-08 ENCOUNTER — Telehealth: Payer: Self-pay | Admitting: Nurse Practitioner

## 2013-11-08 DIAGNOSIS — E039 Hypothyroidism, unspecified: Secondary | ICD-10-CM

## 2013-11-08 MED ORDER — LEVOTHYROXINE SODIUM 100 MCG PO TABS: 100.0000 ug | ORAL_TABLET | Freq: Every day | ORAL | Status: DC

## 2013-11-08 NOTE — Telephone Encounter (Signed)
pls call pt: Advise Overall labs look good, BUT: -vit D low-needs to start 100 iu D3 daily -TSH is not therapuetic. I have increased dose to 100 mcg. Pls sched lab appt. For 6 weeks to check TSH.

## 2013-11-09 ENCOUNTER — Other Ambulatory Visit: Payer: Self-pay

## 2013-11-09 ENCOUNTER — Other Ambulatory Visit: Payer: Self-pay | Admitting: Nurse Practitioner

## 2013-11-09 DIAGNOSIS — E039 Hypothyroidism, unspecified: Secondary | ICD-10-CM

## 2013-11-09 NOTE — Telephone Encounter (Signed)
SPoke with pt, advised lab results. Pt also states that her insurance will not pay for the nasal spray we sent in.

## 2013-11-09 NOTE — Telephone Encounter (Signed)
pls call pt: Advise My error- Vitamin D3 is 1000 iu daily not 100.  There is no other antihistamine nasal spray-that's the only one on market.  Pls advise pt.

## 2013-11-10 ENCOUNTER — Other Ambulatory Visit: Payer: Self-pay | Admitting: *Deleted

## 2013-11-10 DIAGNOSIS — E039 Hypothyroidism, unspecified: Secondary | ICD-10-CM

## 2013-11-10 MED ORDER — LEVOTHYROXINE SODIUM 100 MCG PO TABS: 100.0000 ug | ORAL_TABLET | Freq: Every day | ORAL | Status: DC

## 2013-11-10 NOTE — Telephone Encounter (Signed)
Pt notified, that brand name for synthroid sent to pharmacy

## 2013-11-10 NOTE — Telephone Encounter (Signed)
Patient called requesting rx the synthroid be resent to [facility]. Called rx into pharmacy.

## 2013-11-16 ENCOUNTER — Ambulatory Visit (INDEPENDENT_AMBULATORY_CARE_PROVIDER_SITE_OTHER): Payer: 59 | Admitting: Women's Health

## 2013-11-16 ENCOUNTER — Encounter: Payer: Self-pay | Admitting: Women's Health

## 2013-11-16 VITALS — BP 110/70 | Ht 63.0 in | Wt 129.0 lb

## 2013-11-16 DIAGNOSIS — Z79890 Hormone replacement therapy (postmenopausal): Secondary | ICD-10-CM

## 2013-11-16 DIAGNOSIS — Z01419 Encounter for gynecological examination (general) (routine) without abnormal findings: Secondary | ICD-10-CM

## 2013-11-16 MED ORDER — ESTRADIOL-NORETHINDRONE ACET 0.5-0.1 MG PO TABS: 1.0000 | ORAL_TABLET | Freq: Every day | ORAL | Status: DC

## 2013-11-16 NOTE — Progress Notes (Signed)
Heather Chambers 06-06-1955 [id number]    History:    Presents for annual exam.  Postmenopausal on HRT with no bleeding. Normal Pap and mammogram history. Primary care manages hypothyroid. Negative colonoscopy 2009. Sister breast cancer age 56 maternal grandmother breast cancer. Osteopenia 11/2012 T score AP spine -1.5 hip average -0.6.   Past medical history, past surgical history, family history and social history were all reviewed and documented in the EPIC chart. Works at [employer]. 56 year old son doing well.  ROS:  A  12 point ROS was performed and pertinent positives and negatives are included.  Exam:  Filed Vitals:   11/16/13 1504  BP: 110/70    General appearance:  Normal Thyroid:  Symmetrical, normal in size, without palpable masses or nodularity. Respiratory  Auscultation:  Clear without wheezing or rhonchi Cardiovascular  Auscultation:  Regular rate, without rubs, murmurs or gallops  Edema/varicosities:  Not grossly evident Abdominal  Soft,nontender, without masses, guarding or rebound.  Liver/spleen:  No organomegaly noted  Hernia:  None appreciated  Skin  Inspection:  Grossly normal   Breasts: Examined lying and sitting.     Right: Without masses, retractions, discharge or axillary adenopathy.     Left: Without masses, retractions, discharge or axillary adenopathy. Gentitourinary   Inguinal/mons:  Normal without inguinal adenopathy  External genitalia:  Normal  BUS/Urethra/Skene's glands:  Normal  Vagina:  Normal  Cervix:  Normal  Uterus:   normal in size, shape and contour.  Midline and mobile  Adnexa/parametria:     Rt: Without masses or tenderness.   Lt: Without masses or tenderness.  Anus and perineum: Normal  Digital rectal exam: Normal sphincter tone without palpated masses or tenderness  Assessment/Plan:  58 y.o. MWF G1P1 for annual exam with no complaints.  Postmenopausal on HRT with no bleeding Hypothyroid-primary care manages  labs and meds Osteopenia  Plan: Activella 0.5/.1 prescription, proper use, slight risk for blood clots , strokes and breast cancer reviewed, reviewed best to use shortest amount of time possible. Will try Activella 0.5/0.1 if problems with lower dose will call. (Had been on Activella 1/5.)  SBE's, continue annual 3-D mammogram history of dense breast, calcium rich diet, vitamin D 2000 daily encouraged. History of normal vitamin D level. Home safety, fall prevention and importance of regular exercise discussed. Pap normal 2014, new screening guidelines reviewed.   Heather Chambers,Heather Chambers [facility]NP, 5:39 PM 11/16/2013

## 2013-11-16 NOTE — Patient Instructions (Signed)
Health Recommendations for Postmenopausal Women Respected and ongoing research has looked at the most common causes of death, disability, and poor quality of life in postmenopausal women. The causes include heart disease, diseases of blood vessels, diabetes, depression, cancer, and bone loss (osteoporosis). Many things can be done to help lower the chances of developing these and other common problems. CARDIOVASCULAR DISEASE Heart Disease: A heart attack is a medical emergency. Know the signs and symptoms of a heart attack. Below are things women can do to reduce their risk for heart disease.   Do not smoke. If you smoke, quit.  Aim for a healthy weight. Being overweight causes many preventable deaths. Eat a healthy and balanced diet and drink an adequate amount of liquids.  Get moving. Make a commitment to be more physically active. Aim for 30 minutes of activity on most, if not all days of the week.  Eat for heart health. Choose a diet that is low in saturated fat and cholesterol and eliminate trans fat. Include whole grains, vegetables, and fruits. Read and understand the labels on food containers before buying.  Know your numbers. Ask your caregiver to check your blood pressure, cholesterol (total, HDL, LDL, triglycerides) and blood glucose. Work with your caregiver on improving your entire clinical picture.  High blood pressure. Limit or stop your table salt intake (try salt substitute and food seasonings). Avoid salty foods and drinks. Read labels on food containers before buying. Eating well and exercising can help control high blood pressure. STROKE  Stroke is a medical emergency. Stroke may be the result of a blood clot in a blood vessel in the brain or by a brain hemorrhage (bleeding). Know the signs and symptoms of a stroke. To lower the risk of developing a stroke:  Avoid fatty foods.  Quit smoking.  Control your diabetes, blood pressure, and irregular heart rate. THROMBOPHLEBITIS  (BLOOD CLOT) OF THE LEG  Becoming overweight and leading a stationary lifestyle may also contribute to developing blood clots. Controlling your diet and exercising will help lower the risk of developing blood clots. CANCER SCREENING  Breast Cancer: Take steps to reduce your risk of breast cancer.  You should practice "breast self-awareness." This means understanding the normal appearance and feel of your breasts and should include breast self-examination. Any changes detected, no matter how small, should be reported to your caregiver.  After age 40, you should have a clinical breast exam (CBE) every year.  Starting at age 40, you should consider having a mammogram (breast X-ray) every year.  If you have a family history of breast cancer, talk to your caregiver about genetic screening.  If you are at high risk for breast cancer, talk to your caregiver about having an MRI and a mammogram every year.  Intestinal or Stomach Cancer: Tests to consider are a rectal exam, fecal occult blood, sigmoidoscopy, and colonoscopy. Women who are high risk may need to be screened at an earlier age and more often.  Cervical Cancer:  Beginning at age 30, you should have a Pap test every 3 years as long as the past 3 Pap tests have been normal.  If you have had past treatment for cervical cancer or a condition that could lead to cancer, you need Pap tests and screening for cancer for at least 20 years after your treatment.  If you had a hysterectomy for a problem that was not cancer or a condition that could lead to cancer, then you no longer need Pap tests.    If you are between ages 65 and 70, and you have had normal Pap tests going back 10 years, you no longer need Pap tests.  If Pap tests have been discontinued, risk factors (such as a new sexual partner) need to be reassessed to determine if screening should be resumed.  Some medical problems can increase the chance of getting cervical cancer. In these  cases, your caregiver may recommend more frequent screening and Pap tests.  Uterine Cancer: If you have vaginal bleeding after reaching menopause, you should notify your caregiver.  Ovarian Cancer: Other than yearly pelvic exams, there are no reliable tests available to screen for ovarian cancer at this time except for yearly pelvic exams.  Lung Cancer: Yearly chest X-rays can detect lung cancer and should be done on high risk women, such as cigarette smokers and women with chronic lung disease (emphysema).  Skin Cancer: A complete body skin exam should be done at your yearly examination. Avoid overexposure to the sun and ultraviolet light lamps. Use a strong sun block cream when in the sun. All of these things are important for lowering the risk of skin cancer. MENOPAUSE Menopause Symptoms: Hormone therapy products are effective for treating symptoms associated with menopause:  Moderate to severe hot flashes.  Night sweats.  Mood swings.  Headaches.  Tiredness.  Loss of sex drive.  Insomnia.  Other symptoms. Hormone replacement carries certain risks, especially in older women. Women who use or are thinking about using estrogen or estrogen with progestin treatments should discuss that with their caregiver. Your caregiver will help you understand the benefits and risks. The ideal dose of hormone replacement therapy is not known. The Food and Drug Administration (FDA) has concluded that hormone therapy should be used only at the lowest doses and for the shortest amount of time to reach treatment goals.  OSTEOPOROSIS Protecting Against Bone Loss and Preventing Fracture If you use hormone therapy for prevention of bone loss (osteoporosis), the risks for bone loss must outweigh the risk of the therapy. Ask your caregiver about other medications known to be safe and effective for preventing bone loss and fractures. To guard against bone loss or fractures, the following is recommended:  If  you are younger than age 50, take 1000 mg of calcium and at least 600 mg of Vitamin D per day.  If you are older than age 50 but younger than age 70, take 1200 mg of calcium and at least 600 mg of Vitamin D per day.  If you are older than age 70, take 1200 mg of calcium and at least 800 mg of Vitamin D per day. Smoking and excessive alcohol intake increases the risk of osteoporosis. Eat foods rich in calcium and vitamin D and do weight bearing exercises several times a week as your caregiver suggests. DIABETES Diabetes Mellitus: If you have type I or type 2 diabetes, you should keep your blood sugar under control with diet, exercise, and recommended medication. Avoid starchy and fatty foods, and too many sweets. Being overweight can make diabetes control more difficult. COGNITION AND MEMORY Cognition and Memory: Menopausal hormone therapy is not recommended for the prevention of cognitive disorders such as Alzheimer's disease or memory loss.  DEPRESSION  Depression may occur at any age, but it is common in elderly women. This may be because of physical, medical, social (loneliness), or financial problems and needs. If you are experiencing depression because of medical problems and control of symptoms, talk to your caregiver about this. Physical   activity and exercise may help with mood and sleep. Community and volunteer involvement may improve your sense of value and worth. If you have depression and you feel that the problem is getting worse or becoming severe, talk to your caregiver about which treatment options are best for you. ACCIDENTS  Accidents are common and can be serious in elderly woman. Prepare your house to prevent accidents. Eliminate throw rugs, place hand bars in bath, shower, and toilet areas. Avoid wearing high heeled shoes or walking on wet, snowy, and icy areas. Limit or stop driving if you have vision or hearing problems, or if you feel you are unsteady with your movements and  reflexes. HEPATITIS C Hepatitis C is a type of viral infection affecting the liver. It is spread mainly through contact with blood from an infected person. It can be treated, but if left untreated, it can lead to severe liver damage over the years. Many people who are infected do not know that the virus is in their blood. If you are a "baby-boomer", it is recommended that you have one screening test for Hepatitis C. IMMUNIZATIONS  Several immunizations are important to consider having during your senior years, including:   Tetanus, diphtheria, and pertussis booster shot.  Influenza every year before the flu season begins.  Pneumonia vaccine.  Shingles vaccine.  Others, as indicated based on your specific needs. Talk to your caregiver about these. Document Released: 03/22/2005 Document Revised: 06/14/2013 Document Reviewed: 11/16/2007 ExitCare Patient Information 2015 [employer]. This information is not intended to replace advice given to you by your health care provider. Make sure you discuss any questions you have with your health care provider.  

## 2013-11-30 ENCOUNTER — Telehealth: Payer: Self-pay | Admitting: *Deleted

## 2013-11-30 ENCOUNTER — Other Ambulatory Visit: Payer: Self-pay | Admitting: Gynecology

## 2013-11-30 DIAGNOSIS — Z79890 Hormone replacement therapy (postmenopausal): Secondary | ICD-10-CM

## 2013-11-30 DIAGNOSIS — Z1231 Encounter for screening mammogram for malignant neoplasm of breast: Secondary | ICD-10-CM

## 2013-11-30 MED ORDER — ACTIVELLA 0.5-0.1 MG PO TABS: 1.0000 | ORAL_TABLET | Freq: Every day | ORAL | Status: DC

## 2013-11-30 NOTE — Telephone Encounter (Signed)
Pt called requesting brand only Activella Rx sent to  Mail order [facility]. Rx will be sent.

## 2013-12-01 ENCOUNTER — Telehealth: Payer: Self-pay | Admitting: *Deleted

## 2013-12-01 NOTE — Telephone Encounter (Signed)
Pt said [facility] Activella 0.5-0.1 mg was sent. Pt said she would be taking Jinteli 0.5-0.1 mg pt said you were going to change dose not drug. Pt said pharmacist told her this is not the same drug.  If the above is correct okay to send Jinteli 0.5-0.1? Please advise

## 2013-12-01 NOTE — Telephone Encounter (Signed)
Activella is the brand name, had been on a higher dose Activella 1/0.5. Generic is fine for this medication.

## 2013-12-02 NOTE — Telephone Encounter (Signed)
Pt informed with the below note. 

## 2013-12-13 ENCOUNTER — Encounter: Payer: Self-pay | Admitting: Women's Health

## 2013-12-17 ENCOUNTER — Ambulatory Visit (HOSPITAL_COMMUNITY)
Admission: RE | Admit: 2013-12-17 | Discharge: 2013-12-17 | Disposition: A | Discharge status: Home / Self Care | Payer: 59 | Source: Ambulatory Visit | Attending: Gynecology | Admitting: Gynecology

## 2013-12-17 DIAGNOSIS — Z1231 Encounter for screening mammogram for malignant neoplasm of breast: Secondary | ICD-10-CM | POA: Diagnosis not present

## 2013-12-20 ENCOUNTER — Telehealth: Payer: Self-pay

## 2013-12-20 NOTE — Telephone Encounter (Signed)
[facility] requesting refill on Sumatriptan 100 mg tabs 90 day supply.

## 2013-12-21 ENCOUNTER — Other Ambulatory Visit: Payer: Self-pay | Admitting: Nurse Practitioner

## 2013-12-21 DIAGNOSIS — G43009 Migraine without aura, not intractable, without status migrainosus: Secondary | ICD-10-CM

## 2013-12-21 MED ORDER — SUMATRIPTAN SUCCINATE 100 MG PO TABS: 100.0000 mg | ORAL_TABLET | Freq: Once | ORAL | Status: DC

## 2013-12-24 ENCOUNTER — Other Ambulatory Visit: Payer: Self-pay | Admitting: *Deleted

## 2013-12-29 NOTE — Telephone Encounter (Signed)
Done

## 2014-01-04 ENCOUNTER — Other Ambulatory Visit (INDEPENDENT_AMBULATORY_CARE_PROVIDER_SITE_OTHER): Payer: 59

## 2014-01-04 DIAGNOSIS — E039 Hypothyroidism, unspecified: Secondary | ICD-10-CM

## 2014-01-05 ENCOUNTER — Telehealth: Payer: Self-pay | Admitting: Nurse Practitioner

## 2014-01-05 DIAGNOSIS — E039 Hypothyroidism, unspecified: Secondary | ICD-10-CM

## 2014-01-05 LAB — TSH: TSH: 1.16 u[IU]/mL (ref 0.35–4.50)

## 2014-01-05 MED ORDER — LEVOTHYROXINE SODIUM 100 MCG PO TABS: 100.0000 ug | ORAL_TABLET | Freq: Every day | ORAL | Status: DC

## 2014-01-05 NOTE — Telephone Encounter (Signed)
Spoke with pt, advised thyroid results. Pt understood. I sent in refill for her thyroid medication.

## 2014-01-05 NOTE — Telephone Encounter (Signed)
pls call pt: Advise Thyroid at goal. No med changes. Pls schedule OV in 3 mos.

## 2014-04-12 ENCOUNTER — Other Ambulatory Visit: Payer: Self-pay | Admitting: *Deleted

## 2014-04-12 DIAGNOSIS — E039 Hypothyroidism, unspecified: Secondary | ICD-10-CM

## 2014-04-12 MED ORDER — LEVOTHYROXINE SODIUM 100 MCG PO TABS: 100.0000 ug | ORAL_TABLET | Freq: Every day | ORAL | Status: DC

## 2014-05-23 ENCOUNTER — Other Ambulatory Visit: Payer: Self-pay

## 2014-05-23 DIAGNOSIS — J309 Allergic rhinitis, unspecified: Secondary | ICD-10-CM

## 2014-05-23 DIAGNOSIS — G43009 Migraine without aura, not intractable, without status migrainosus: Secondary | ICD-10-CM

## 2014-05-23 MED ORDER — AZELASTINE HCL 0.1 % NA SOLN: 1.0000 | Freq: Two times a day (BID) | NASAL | Status: DC

## 2014-05-23 MED ORDER — SUMATRIPTAN SUCCINATE 100 MG PO TABS: 100.0000 mg | ORAL_TABLET | Freq: Once | ORAL | Status: DC

## 2014-05-23 NOTE — Telephone Encounter (Signed)
i will send both scripts, she needs appt scheduled for May-6 mo. F/u for thyroid.

## 2014-05-23 NOTE — Telephone Encounter (Signed)
Please Advise Refill Request? Refill request for- Sumatriptan and Azelastine Nasal Last filled by MD on - 12/21/13 and 11/03/13 Last Appt - 11/03/13        Next Appt - none scheduled Pharmacy- [facility]

## 2014-05-31 ENCOUNTER — Ambulatory Visit (INDEPENDENT_AMBULATORY_CARE_PROVIDER_SITE_OTHER): Payer: 59 | Admitting: Nurse Practitioner

## 2014-05-31 ENCOUNTER — Encounter: Payer: Self-pay | Admitting: Nurse Practitioner

## 2014-05-31 ENCOUNTER — Other Ambulatory Visit: Payer: Self-pay | Admitting: Nurse Practitioner

## 2014-05-31 VITALS — BP 109/71 | HR 63 | Temp 97.5°F | Ht 63.0 in | Wt 131.0 lb

## 2014-05-31 DIAGNOSIS — G8929 Other chronic pain: Secondary | ICD-10-CM

## 2014-05-31 DIAGNOSIS — E039 Hypothyroidism, unspecified: Secondary | ICD-10-CM | POA: Diagnosis not present

## 2014-05-31 DIAGNOSIS — R519 Headache, unspecified: Secondary | ICD-10-CM | POA: Insufficient documentation

## 2014-05-31 DIAGNOSIS — J302 Other seasonal allergic rhinitis: Secondary | ICD-10-CM

## 2014-05-31 DIAGNOSIS — Z114 Encounter for screening for human immunodeficiency virus [HIV]: Secondary | ICD-10-CM | POA: Diagnosis not present

## 2014-05-31 DIAGNOSIS — J3089 Other allergic rhinitis: Secondary | ICD-10-CM | POA: Insufficient documentation

## 2014-05-31 DIAGNOSIS — R51 Headache: Secondary | ICD-10-CM | POA: Diagnosis not present

## 2014-05-31 LAB — SEDIMENTATION RATE: Sed Rate: 5 mm/hr (ref 0–22)

## 2014-05-31 LAB — TSH: TSH: 0.41 u[IU]/mL (ref 0.35–4.50)

## 2014-05-31 MED ORDER — MOMETASONE FUROATE 50 MCG/ACT NA SUSP: 2.0000 | Freq: Every day | NASAL | Status: DC

## 2014-05-31 NOTE — Patient Instructions (Addendum)
Consider buying pseudoephedrine & ibuprophen separately.  Use 60 mg pseudoephedrine 1 to 2 times daily. Use 400 to 600 mg ibuprophen in morning. You may not need it with higher dose of pseudoephedrine.  My office will call with lab results.  Have a great trip!

## 2014-05-31 NOTE — Progress Notes (Signed)
Pre visit review using our clinic review tool, if applicable. No additional management support is needed unless otherwise documented below in the visit note. 

## 2014-05-31 NOTE — Progress Notes (Signed)
Subjective:     Heather Chambers is a 59 y.o. female presents for follow up of thyroid disease & seasonal allergies. Thyroid: current meds synthroid 100 mcg qd. Last TSH at goal. Denies palpitations, diarrhea, constipation, wt gail or loss, change in energy, vision. seasonal allergies: long standing. She has been to ENT-had CT sinuses. No problems. Saw allergist-did immunotherapy for years. Current meds- azelastine, daily sinus wash, saline spray, advil cold & sinus. Doesn't feel this regimen works as well for her as when using nasonex. Her ins no longer covers this, so she stopped using it. She continues to have constant clear runny nose, post-nasal drip, frontal pressure. Would like to go outside, but allergies flare immediately.  Continues to c/o constant HA-says frontal pressure is always present. 2 to 3 times gets "bad enough to take imitrex"-feels like band round head. HA more frequent toward end of week & persist through weekends. Taking imitrex 2-3 times week. Decreases pain to tolerable level. Doesn't like how imitrex makes her feel -foggy.  Thinks HA have multiple triggers-stress, allergies, sinus pressure. Says sleeping well. [facility]. Never used relpax.  The following portions of the patient's history were reviewed and updated as appropriate: allergies, current medications, past medical history, past social history, past surgical history and problem list.  Review of Systems Pertinent items are noted in HPI.    Objective:    BP 109/71 mmHg  Pulse 63  Temp(Src) 97.5 F (36.4 C) (Oral)  Ht 5\' 3"  (1.6 m)  Wt 131 lb (59.421 kg)  BMI 23.21 kg/m2  SpO2 100% BP 109/71 mmHg  Pulse 63  Temp(Src) 97.5 F (36.4 C) (Oral)  Ht 5\' 3"  (1.6 m)  Wt 131 lb (59.421 kg)  BMI 23.21 kg/m2  SpO2 100% General appearance: alert, cooperative, appears stated age and no distress Head: Normocephalic, without obvious abnormality, atraumatic Eyes: negative findings: lids and lashes  normal and conjunctivae and sclerae normal Ears: normal TM's and external ear canals both ears Nose: clear discharge, appears to have sore along R septum. Throat: lips, mucosa, and tongue normal; teeth and gums normal Neck: no adenopathy, supple, symmetrical, trachea midline and thyroid not enlarged, symmetric, no tenderness/mass/nodules Lungs: clear to auscultation bilaterally Heart: regular rate and rhythm, S1, S2 normal, no murmur, click, rub or gallop Neurologic: Grossly normal    Assessment:Plan     1. Hypothyroidism, unspecified hypothyroidism type stable - TSH  2. Seasonal allergies Continue current regimen. Add nasonex. - Ambulatory referral to Allergy - IgG, IgA, IgM - mometasone (NASONEX) 50 MCG/ACT nasal spray; Place 2 sprays into the nose daily.  Dispense: 17 g; Refill: 6  3. Chronic nonintractable headache, unspecified headache type DD: allery, food triggers, stress, AI disease - Sedimentation rate - Antinuclear Antibodies, IFA - IgG, IgA, IgM  4. Screening for HIV (human immunodeficiency virus) - HIV antibody

## 2014-06-01 ENCOUNTER — Other Ambulatory Visit: Payer: Self-pay | Admitting: Nurse Practitioner

## 2014-06-01 DIAGNOSIS — R768 Other specified abnormal immunological findings in serum: Secondary | ICD-10-CM

## 2014-06-01 LAB — FANA STAINING PATTERNS

## 2014-06-01 LAB — IGG, IGA, IGM
IgA: 136 mg/dL (ref 69–380)
IgG (Immunoglobin G), Serum: 1070 mg/dL (ref 690–1700)
IgM, Serum: 36 mg/dL — ABNORMAL LOW (ref 52–322)

## 2014-06-01 LAB — HIV ANTIBODY (ROUTINE TESTING W REFLEX): HIV 1&2 Ab, 4th Generation: NONREACTIVE

## 2014-06-01 NOTE — Progress Notes (Signed)
Called lab, test was able to be added.

## 2014-06-03 LAB — PROTEIN ELECTROPHORESIS, SERUM
ALPHA-2-GLOBULIN: 0.6 g/dL (ref 0.5–0.9)
Albumin ELP: 4.2 g/dL (ref 3.8–4.8)
Alpha-1-Globulin: 0.2 g/dL (ref 0.2–0.3)
Beta 2: 0.2 g/dL (ref 0.2–0.5)
Beta Globulin: 0.4 g/dL (ref 0.4–0.6)
Gamma Globulin: 1 g/dL (ref 0.8–1.7)
Total Protein, Serum Electrophoresis: 6.7 g/dL (ref 6.1–8.1)

## 2014-06-09 ENCOUNTER — Telehealth: Payer: Self-pay | Admitting: Nurse Practitioner

## 2014-06-09 LAB — ANTINUCLEAR ANTIBODIES, IFA: ANA TITER 1: NEGATIVE

## 2014-06-09 LAB — ANA W/REFLEX: ANA: NEGATIVE

## 2014-06-09 LAB — SPECIMEN STATUS REPORT

## 2014-06-09 NOTE — Telephone Encounter (Signed)
pls call pt: Advise All other labs normal. Keep appt with allergist. Ask who she is seeing & fax labs over-show me fax, before sending, please.

## 2014-06-10 NOTE — Telephone Encounter (Signed)
LMOVM asking patient to call back.  She is seeing [facility] Okolona CallasSharma at [facility] on 06/20/14

## 2014-06-13 ENCOUNTER — Telehealth: Payer: Self-pay | Admitting: Nurse Practitioner

## 2014-06-13 NOTE — Telephone Encounter (Signed)
Igm low-can be c/w chronic sinusitis, Ai disease, ca. ANA, Spep, ESR nml. Ref to immunology.  LM to discuss w/pt.

## 2014-06-13 NOTE — Telephone Encounter (Signed)
LMOVM-identified about lab results. Patient to call back with any questions or concerns. DPR Signed.  

## 2014-06-20 ENCOUNTER — Telehealth: Payer: Self-pay

## 2014-06-20 NOTE — Telephone Encounter (Signed)
See phone note

## 2014-06-20 NOTE — Telephone Encounter (Signed)
Patient called back wondering about her lab results from the messages you had left her. Please call back.

## 2014-07-07 ENCOUNTER — Other Ambulatory Visit: Payer: Self-pay

## 2014-07-07 DIAGNOSIS — E039 Hypothyroidism, unspecified: Secondary | ICD-10-CM

## 2014-07-07 MED ORDER — LEVOTHYROXINE SODIUM 100 MCG PO TABS: 100.0000 ug | ORAL_TABLET | Freq: Every day | ORAL | Status: DC

## 2014-07-07 NOTE — Telephone Encounter (Signed)
Please Advise Refill Request? Refill request for- Synthroid (90 Day supply) Last filled by MD on - 04/12/14 Last Appt - 05/31/14        Next Appt - none scheduled [facility]

## 2014-07-07 NOTE — Telephone Encounter (Signed)
I will send enough thyroid med to last through Oct. Pls sched OV for Oct.

## 2014-11-25 ENCOUNTER — Ambulatory Visit: Payer: 59 | Admitting: Family Medicine

## 2014-11-29 ENCOUNTER — Other Ambulatory Visit: Payer: Self-pay | Admitting: *Deleted

## 2014-11-29 DIAGNOSIS — E039 Hypothyroidism, unspecified: Secondary | ICD-10-CM

## 2014-11-29 MED ORDER — LEVOTHYROXINE SODIUM 100 MCG PO TABS: 100.0000 ug | ORAL_TABLET | Freq: Every day | ORAL | Status: DC

## 2014-11-29 NOTE — Telephone Encounter (Signed)
Synthroid refilled per refill protocol

## 2015-01-03 ENCOUNTER — Encounter: Payer: Self-pay | Admitting: Family Medicine

## 2015-01-03 ENCOUNTER — Ambulatory Visit (INDEPENDENT_AMBULATORY_CARE_PROVIDER_SITE_OTHER): Payer: 59 | Admitting: Family Medicine

## 2015-01-03 VITALS — BP 125/82 | HR 70 | Temp 98.5°F | Resp 20 | Ht 63.0 in | Wt 124.5 lb

## 2015-01-03 DIAGNOSIS — Z0000 Encounter for general adult medical examination without abnormal findings: Secondary | ICD-10-CM | POA: Diagnosis not present

## 2015-01-03 DIAGNOSIS — G43009 Migraine without aura, not intractable, without status migrainosus: Secondary | ICD-10-CM

## 2015-01-03 DIAGNOSIS — E038 Other specified hypothyroidism: Secondary | ICD-10-CM

## 2015-01-03 LAB — COMPREHENSIVE METABOLIC PANEL
ALT: 12 U/L (ref 0–35)
AST: 16 U/L (ref 0–37)
Albumin: 4.4 g/dL (ref 3.5–5.2)
Alkaline Phosphatase: 49 U/L (ref 39–117)
BILIRUBIN TOTAL: 0.6 mg/dL (ref 0.2–1.2)
BUN: 15 mg/dL (ref 6–23)
CHLORIDE: 102 meq/L (ref 96–112)
CO2: 28 meq/L (ref 19–32)
CREATININE: 0.76 mg/dL (ref 0.40–1.20)
Calcium: 9.4 mg/dL (ref 8.4–10.5)
GFR: 83.29 mL/min (ref >60.00)
GLUCOSE: 78 mg/dL (ref 70–99)
POTASSIUM: 4.5 meq/L (ref 3.5–5.1)
Sodium: 137 mEq/L (ref 135–145)
Total Protein: 6.8 g/dL (ref 6.0–8.3)

## 2015-01-03 LAB — CBC WITH DIFFERENTIAL/PLATELET
Basophils Absolute: 0.1 10*3/uL (ref 0.0–0.1)
Basophils Relative: 0.8 % (ref 0.0–3.0)
EOS PCT: 0.9 % (ref 0.0–5.0)
Eosinophils Absolute: 0.1 10*3/uL (ref 0.0–0.7)
HCT: 41.5 % (ref 36.0–46.0)
Hemoglobin: 13.7 g/dL (ref 12.0–15.0)
LYMPHS ABS: 2.3 10*3/uL (ref 0.7–4.0)
Lymphocytes Relative: 34.3 % (ref 12.0–46.0)
MCHC: 33.1 g/dL (ref 30.0–36.0)
MCV: 91.5 fl (ref 78.0–100.0)
MONOS PCT: 7.3 % (ref 3.0–12.0)
Monocytes Absolute: 0.5 10*3/uL (ref 0.1–1.0)
NEUTROS ABS: 3.8 10*3/uL (ref 1.4–7.7)
NEUTROS PCT: 56.7 % (ref 43.0–77.0)
PLATELETS: 306 10*3/uL (ref 150.0–400.0)
RBC: 4.53 Mil/uL (ref 3.87–5.11)
RDW: 14.2 % (ref 11.5–15.5)
WBC: 6.8 10*3/uL (ref 4.0–10.5)

## 2015-01-03 LAB — LIPID PANEL
CHOL/HDL RATIO: 2
Cholesterol: 170 mg/dL (ref 0–200)
HDL: 69.7 mg/dL (ref >39.00)
LDL CALC: 90 mg/dL (ref 0–99)
NONHDL: 100.55
TRIGLYCERIDES: 54 mg/dL (ref 0.0–149.0)
VLDL: 10.8 mg/dL (ref 0.0–40.0)

## 2015-01-03 LAB — T3, FREE: T3, Free: 3.2 pg/mL (ref 2.3–4.2)

## 2015-01-03 LAB — T4, FREE: Free T4: 1.1 ng/dL (ref 0.60–1.60)

## 2015-01-03 LAB — TSH: TSH: 0.04 u[IU]/mL — ABNORMAL LOW (ref 0.35–4.50)

## 2015-01-03 MED ORDER — SUMATRIPTAN SUCCINATE 100 MG PO TABS: 100.0000 mg | ORAL_TABLET | Freq: Once | ORAL | Status: DC

## 2015-01-03 NOTE — Progress Notes (Signed)
**Note De-Identified Heather Obfuscation** Patient ID: Heather Chambers, female   DOB: Dec 12, 1955, 59 y.o.   MRN: [id number]      Patient ID: Heather Chambers, female    DOB: 05/13/1955, 59 y.o.   MRN: [id number]  Subjective:  Heather Chambers is a 59 y.o.  Female present for annual exam All past medical history, surgical history, allergies, family history, immunizations, medications and social history were updated in the electronic medical record today. All recent labs, ED visits and hospitalizations within the last year were reviewed.  Health maintenance:  Colonoscopy: 10 year, No polys no FHx. 2011.  Mammogram: GYn, 2011, always normal, FHX of breast cancer. Getting 3D.  Cervical cancer screening: 2014 UTD, normal. 3 year f/u, has gyn.  Immunizations: UTD flu and tdap.  Infectious disease screening: HIV indicated. Hep C indicated.  DEXA: Osteopenia, DEXA 2-3 years ago. Gyn completes.  Patient has a Dental home.  Left thumb discomfort: Patient states that she was getting out of the tub in lost her traction with her hand. She reports her left thumb bent/flexed forward approximately 3 weeks ago. She was wearing a small splint, and trying to not use her hand as frequent. She reports today that her thumb is still mildly tender and mildly swollen. She denies any numbness or tingling.   Past Medical History  Diagnosis Date  . Chronic rhinitis   . Health maintenance examination   . Hypothyroidism   . Chronic pelvic pain in female 1995    LLQ  . Menopausal symptoms   . Osteoporosis     Osteopenia  . Allergy   . Migraine    Allergies  Allergen Reactions  . Cefdinir   . Etodolac     "LODINE"  . Phenergan [Promethazine Hcl]     Restlessness, twitching   Past Surgical History  Procedure Laterality Date  . Pelvic laparoscopy      DIAG LAP   Family History  Problem Relation Age of Onset  . Migraines Mother   . Rheum arthritis Mother   . Breast cancer Maternal Grandmother   . Heart disease Maternal Grandfather   . Breast cancer  Sister    Social History   Social History  . Marital Status: Married    Spouse Name: N/A  . Number of Children: 1  . Years of Education: N/A   Occupational History  .  [place] History Main Topics  . Smoking status: Never Smoker   . Smokeless tobacco: Never Used  . Alcohol Use: 2.4 oz/week    4 Cans of beer per week  . Drug Use: No  . Sexual Activity: Yes    Birth Control/ Protection: Post-menopausal   Other Topics Concern  . Not on file   Social History Narrative   [name] lives with her husband. She has a grown son who recently moved to [place] for a job. 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    Wears seatbelt.    Exercises 3-5x a week.    Smoke alarm in the home.    Feels safe in relationships    Objective: BP 125/82 mmHg  Pulse 70  Temp(Src) 98.5 F (36.9 C) (Oral)  Resp 20  Ht _0  (1.6 m)  Wt 124 lb 8 oz (56.473 kg)  BMI 22.06 kg/m2  SpO2 99% Gen: Afebrile. No acute distress. Nontoxic in appearance, well-developed, well-nourished, physically fit, caucasian female. Pleasant.  HENT: AT.  [place].  Bilateral TM visualized and normal in appearance, normal external auditory canal. MMM, no oral lesions, Good dentition. Bilateral nares with erythema, no swelling. Throat w/o erythema, ulcerations or exudates. No Cough on exam, No hoarseness on exam. Eyes:Pupils Equal Round Reactive to light, Extraocular movements intact,  Conjunctiva without redness, discharge or icterus. Neck/lymp/endocrine: Supple,No lymphadenopathy, mildly enalrged thyroid.  CV: RRR No m/c/g/r,  Noedema, +2/4 P posterior tibialis pulses. No carotid bruits. No JVD. Chest: CTAB, no wheeze, rhonchi or crackles. Normal  Respiratory effort. Good Air movement. Abd: Soft. flat. NTND. BS present. no Masses palpated. No hepatosplenomegaly. No rebound tenderness or guarding. Skin: No rashes, purpura or petechiae. Warm and  well-perfused. Skin intact. Neuro/Msk: Normal gait. PERLA. EOMi. Alert. Oriented x3.  Cranial nerves II through XII intact. Muscle strength 5/5 UE/LE extremity. + finkelstein, normal thumb to finger apposition. Full range of motion with discomfort on flexion.DTRs equal bilaterally. Psych: Normal affect, dress and demeanor. Normal speech. Normal thought content and judgment.   Assessment/plan: Heather Chambers is a 59 y.o. female present for annual exam.  1. Nonintractable migraine, unspecified migraine type - refills needed.  - SUMAtriptan (IMITREX) 100 MG tablet; Take 1 tablet (100 mg total) by mouth once. May repeat in 2 hours if headache persists or recurs.  Dispense: 24 tablet; Refill: 0  2. Other specified hypothyroidism - continue synthroid 100 mcg daily.  - TSH - T3, free - T4, free  3. Encounter for preventive health examination - declines Hep C, all other health maintenance UTD.  - CBC w/Diff - Comp Met (CMET) - Lipid panel Patient was encouraged to exercise greater than 150 minutes a week. Patient was encouraged to choose a diet filled with fresh fruits and vegetables, and lean meats. AVS provided to patient today for education/recommendation on gender specific health and safety maintenance.  4. Heather Chambers, left.  - old injury, is improving at least 53-week-old. - patient encouraged to use ice as needed. NSAIDs as needed. Purchase a wrist splint. AVS on de Quervain's Chambers was provided to patient today.  Return in about 1 year (around 01/03/2016), or CPE.   Howard Pouch, DO [place]

## 2015-01-03 NOTE — Patient Instructions (Signed)
Health Maintenance, Female Adopting a healthy lifestyle and getting preventive care can go a long way to promote health and wellness. Talk with your health care provider about what schedule of regular examinations is right for you. This is a good chance for you to check in with your provider about disease prevention and staying healthy. In between checkups, there are plenty of things you can do on your own. Experts have done a lot of research about which lifestyle changes and preventive measures are most likely to keep you healthy. Ask your health care provider for more information. WEIGHT AND DIET  Eat a healthy diet  Be sure to include plenty of vegetables, fruits, low-fat dairy products, and lean protein.  Do not eat a lot of foods high in solid fats, added sugars, or salt.  Get regular exercise. This is one of the most important things you can do for your health.  Most adults should exercise for at least 150 minutes each week. The exercise should increase your heart rate and make you sweat (moderate-intensity exercise).  Most adults should also do strengthening exercises at least twice a week. This is in addition to the moderate-intensity exercise.  Maintain a healthy weight  Body mass index (BMI) is a measurement that can be used to identify possible weight problems. It estimates body fat based on height and weight. Your health care provider can help determine your BMI and help you achieve or maintain a healthy weight.  For females 20 years of age and older:   A BMI below 18.5 is considered underweight.  A BMI of 18.5 to 24.9 is normal.  A BMI of 25 to 29.9 is considered overweight.  A BMI of 30 and above is considered obese.  Watch levels of cholesterol and blood lipids  You should start having your blood tested for lipids and cholesterol at 57 years of age, then have this test every 5 years.  You may need to have your cholesterol levels checked more often if:  Your lipid  or cholesterol levels are high.  You are older than 57 years of age.  You are at high risk for heart disease.  CANCER SCREENING   Lung Cancer  Lung cancer screening is recommended for adults 55-80 years old who are at high risk for lung cancer because of a history of smoking.  A yearly low-dose CT scan of the lungs is recommended for people who:  Currently smoke.  Have quit within the past 15 years.  Have at least a 30-pack-year history of smoking. A pack year is smoking an average of one pack of cigarettes a day for 1 year.  Yearly screening should continue until it has been 15 years since you quit.  Yearly screening should stop if you develop a health problem that would prevent you from having lung cancer treatment.  Breast Cancer  Practice breast self-awareness. This means understanding how your breasts normally appear and feel.  It also means doing regular breast self-exams. Let your health care provider know about any changes, no matter how small.  If you are in your 20s or 30s, you should have a clinical breast exam (CBE) by a health care provider every 1-3 years as part of a regular health exam.  If you are 40 or older, have a CBE every year. Also consider having a breast X-ray (mammogram) every year.  If you have a family history of breast cancer, talk to your health care provider about genetic screening.  If you   are at high risk for breast cancer, talk to your health care provider about having an MRI and a mammogram every year.  Breast cancer gene (BRCA) assessment is recommended for women who have family members with BRCA-related cancers. BRCA-related cancers include:  Breast.  Ovarian.  Tubal.  Peritoneal cancers.  Results of the assessment will determine the need for genetic counseling and BRCA1 and BRCA2 testing. Cervical Cancer Your health care provider may recommend that you be screened regularly for cancer of the pelvic organs (ovaries, uterus, and  vagina). This screening involves a pelvic examination, including checking for microscopic changes to the surface of your cervix (Pap test). You may be encouraged to have this screening done every 3 years, beginning at age 21.  For women ages 30-65, health care providers may recommend pelvic exams and Pap testing every 3 years, or they may recommend the Pap and pelvic exam, combined with testing for human papilloma virus (HPV), every 5 years. Some types of HPV increase your risk of cervical cancer. Testing for HPV may also be done on women of any age with unclear Pap test results.  Other health care providers may not recommend any screening for nonpregnant women who are considered low risk for pelvic cancer and who do not have symptoms. Ask your health care provider if a screening pelvic exam is right for you.  If you have had past treatment for cervical cancer or a condition that could lead to cancer, you need Pap tests and screening for cancer for at least 20 years after your treatment. If Pap tests have been discontinued, your risk factors (such as having a new sexual partner) need to be reassessed to determine if screening should resume. Some women have medical problems that increase the chance of getting cervical cancer. In these cases, your health care provider may recommend more frequent screening and Pap tests. Colorectal Cancer  This type of cancer can be detected and often prevented.  Routine colorectal cancer screening usually begins at 57 years of age and continues through 57 years of age.  Your health care provider may recommend screening at an earlier age if you have risk factors for colon cancer.  Your health care provider may also recommend using home test kits to check for hidden blood in the stool.  A small camera at the end of a tube can be used to examine your colon directly (sigmoidoscopy or colonoscopy). This is done to check for the earliest forms of colorectal  cancer.  Routine screening usually begins at age 50.  Direct examination of the colon should be repeated every 5-10 years through 57 years of age. However, you may need to be screened more often if early forms of precancerous polyps or small growths are found. Skin Cancer  Check your skin from head to toe regularly.  Tell your health care provider about any new moles or changes in moles, especially if there is a change in a mole's shape or color.  Also tell your health care provider if you have a mole that is larger than the size of a pencil eraser.  Always use sunscreen. Apply sunscreen liberally and repeatedly throughout the day.  Protect yourself by wearing long sleeves, pants, a wide-brimmed hat, and sunglasses whenever you are outside. HEART DISEASE, DIABETES, AND HIGH BLOOD PRESSURE   High blood pressure causes heart disease and increases the risk of stroke. High blood pressure is more likely to develop in:  People who have blood pressure in the high end   of the normal range (130-139/85-89 mm Hg).  People who are overweight or obese.  People who are African American.  If you are 38-23 years of age, have your blood pressure checked every 3-5 years. If you are 61 years of age or older, have your blood pressure checked every year. You should have your blood pressure measured twice--once when you are at a hospital or clinic, and once when you are not at a hospital or clinic. Record the average of the two measurements. To check your blood pressure when you are not at a hospital or clinic, you can use:  An automated blood pressure machine at a pharmacy.  A home blood pressure monitor.  If you are between 45 years and 39 years old, ask your health care provider if you should take aspirin to prevent strokes.  Have regular diabetes screenings. This involves taking a blood sample to check your fasting blood sugar level.  If you are at a normal weight and have a low risk for diabetes,  have this test once every three years after 57 years of age.  If you are overweight and have a high risk for diabetes, consider being tested at a younger age or more often. PREVENTING INFECTION  Hepatitis B  If you have a higher risk for hepatitis B, you should be screened for this virus. You are considered at high risk for hepatitis B if:  You were born in a country where hepatitis B is common. Ask your health care provider which countries are considered high risk.  Your parents were born in a high-risk country, and you have not been immunized against hepatitis B (hepatitis B vaccine).  You have HIV or AIDS.  You use needles to inject street drugs.  You live with someone who has hepatitis B.  You have had sex with someone who has hepatitis B.  You get hemodialysis treatment.  You take certain medicines for conditions, including cancer, organ transplantation, and autoimmune conditions. Hepatitis C  Blood testing is recommended for:  Everyone born from [birth year] through 1963.  Anyone with known risk factors for hepatitis C. Sexually transmitted infections (STIs)  You should be screened for sexually transmitted infections (STIs) including gonorrhea and chlamydia if:  You are sexually active and are younger than 57 years of age.  You are older than 57 years of age and your health care provider tells you that you are at risk for this type of infection.  Your sexual activity has changed since you were last screened and you are at an increased risk for chlamydia or gonorrhea. Ask your health care provider if you are at risk.  If you do not have HIV, but are at risk, it may be recommended that you take a prescription medicine daily to prevent HIV infection. This is called pre-exposure prophylaxis (PrEP). You are considered at risk if:  You are sexually active and do not regularly use condoms or know the HIV status of your partner(s).  You take drugs by injection.  You are sexually  active with a partner who has HIV. Talk with your health care provider about whether you are at high risk of being infected with HIV. If you choose to begin PrEP, you should first be tested for HIV. You should then be tested every 3 months for as long as you are taking PrEP.  PREGNANCY   If you are premenopausal and you may become pregnant, ask your health care provider about preconception counseling.  If you may  become pregnant, take 400 to 800 micrograms (mcg) of folic acid every day.  If you want to prevent pregnancy, talk to your health care provider about birth control (contraception). OSTEOPOROSIS AND MENOPAUSE   Osteoporosis is a disease in which the bones lose minerals and strength with aging. This can result in serious bone fractures. Your risk for osteoporosis can be identified using a bone density scan.  If you are 56 years of age or older, or if you are at risk for osteoporosis and fractures, ask your health care provider if you should be screened.  Ask your health care provider whether you should take a calcium or vitamin D supplement to lower your risk for osteoporosis.  Menopause may have certain physical symptoms and risks.  Hormone replacement therapy may reduce some of these symptoms and risks. Talk to your health care provider about whether hormone replacement therapy is right for you.  HOME CARE INSTRUCTIONS   Schedule regular health, dental, and eye exams.  Stay current with your immunizations.   Do not use any tobacco products including cigarettes, chewing tobacco, or electronic cigarettes.  If you are pregnant, do not drink alcohol.  If you are breastfeeding, limit how much and how often you drink alcohol.  Limit alcohol intake to no more than 1 drink per day for nonpregnant women. One drink equals 12 ounces of beer, 5 ounces of wine, or 1 ounces of hard liquor.  Do not use street drugs.  Do not share needles.  Ask your health care provider for help if  you need support or information about quitting drugs.  Tell your health care provider if you often feel depressed.  Tell your health care provider if you have ever been abused or do not feel safe at home.   This information is not intended to replace advice given to you by your health care provider. Make sure you discuss any questions you have with your health care provider.   Document Released: 08/13/2010 Document Revised: 02/18/2014 Document Reviewed: 12/30/2012 Elsevier Interactive Patient Education 2016 [place] Tenosynovitis Tendons attach muscles to bones. They also help with joint movements. When tendons become irritated or swollen, it is called tendinitis. The extensor pollicis brevis (EPB) tendon connects the EPB muscle to a bone that is near the base of the thumb. The EPB muscle helps to straighten and extend the thumb. De Quervain tenosynovitis is a condition in which the EPB tendon lining (sheath) becomes irritated, thickened, and swollen. This condition is sometimes called stenosing tenosynovitis. This condition causes pain on the thumb side of the back of the wrist. CAUSES Causes of this condition include:  Activities that repeatedly cause your thumb and wrist to extend.  A sudden increase in activity or change in activity that affects your wrist. RISK FACTORS: This condition is more likely to develop in:  Females.  People who have diabetes.  Women who have recently given birth.  People who are over 48 years of age.  People who do activities that involve repeated hand and wrist motions, such as tennis, racquetball, volleyball, gardening, and taking care of children.  People who do heavy labor.  People who have poor wrist strength and flexibility.  People who do not warm up properly before activities. SYMPTOMS Symptoms of this condition include:  Pain or tenderness over the thumb side of the back of the wrist when your thumb and wrist are not  moving.  Pain that gets worse when you straighten your thumb  or extend your thumb or wrist.  Pain when the injured area is touched.  Locking or catching of the thumb joint while you bend and straighten your thumb.  Decreased thumb motion due to pain.  Swelling over the affected area. DIAGNOSIS This condition is diagnosed with a medical history and physical exam. Your health care provider will ask for details about your injury and ask about your symptoms. TREATMENT Treatment may include the use of icing and medicines to reduce pain and swelling. You may also be advised to wear a splint or brace to limit your thumb and wrist motion. In less severe cases, treatment may also include working with a physical therapist to strengthen your wrist and calm the irritation around your EPB tendon sheath. In severe cases, surgery may be needed. HOME CARE INSTRUCTIONS If You Have a Splint or Brace:  Wear it as told by your health care provider. Remove it only as told by your health care provider.  Loosen the splint or brace if your fingers become numb and tingle, or if they turn cold and blue.  Keep the splint or brace clean and dry. Managing Pain, Stiffness, and Swelling   If directed, apply ice to the injured area.  Put ice in a plastic bag.  Place a towel between your skin and the bag.  Leave the ice on for 20 minutes, 2-3 times per day.  Move your fingers often to avoid stiffness and to lessen swelling.  Raise (elevate) the injured area above the level of your heart while you are sitting or lying down. General Instructions  Return to your normal activities as told by your health care provider. Ask your health care provider what activities are safe for you.  Take over-the-counter and prescription medicines only as told by your health care provider.  Keep all follow-up visits as told by your health care provider. This is important.  Do not drive or operate heavy machinery while taking  prescription pain medicine. SEEK MEDICAL CARE IF:  Your pain, tenderness, or swelling gets worse, even if you have had treatment.  You have numbness or tingling in your wrist, hand, or fingers on the injured side.   This information is not intended to replace advice given to you by your health care provider. Make sure you discuss any questions you have with your health care provider.   Document Released: 01/28/2005 Document Revised: 10/19/2014 Document Reviewed: 04/05/2014 Elsevier Interactive Patient Education [employer].

## 2015-01-04 ENCOUNTER — Telehealth: Payer: Self-pay | Admitting: Family Medicine

## 2015-01-04 DIAGNOSIS — E039 Hypothyroidism, unspecified: Secondary | ICD-10-CM

## 2015-01-04 MED ORDER — LEVOTHYROXINE SODIUM 88 MCG PO TABS: 88.0000 ug | ORAL_TABLET | Freq: Every day | ORAL | Status: DC

## 2015-01-04 NOTE — Telephone Encounter (Signed)
Spoke with patient reviewed lab results faxed [facility] per patient request she will call to schedule appt for labs in 12 weeks.

## 2015-01-04 NOTE — Telephone Encounter (Signed)
Please call pt:  - Her labs are all stable, good. With the exception of her thyroid function. Her TSH reflex that her medication dose is too high." This is frustrating for her since she had medication dosage changes over the last year. However we do need to back down on her dose very minimally in order to decrease any negative side effects being over replaced (bone health/heart health). I will repeat her TSH by lab appointment only in 12 weeks. This order has been placed.

## 2015-01-06 ENCOUNTER — Other Ambulatory Visit: Payer: Self-pay | Admitting: Family Medicine

## 2015-02-16 ENCOUNTER — Other Ambulatory Visit: Payer: Self-pay

## 2015-02-16 DIAGNOSIS — Z1231 Encounter for screening mammogram for malignant neoplasm of breast: Secondary | ICD-10-CM

## 2015-02-24 ENCOUNTER — Other Ambulatory Visit (HOSPITAL_COMMUNITY)
Admission: RE | Admit: 2015-02-24 | Discharge: 2015-02-24 | Disposition: A | Discharge status: Home / Self Care | Payer: 59 | Source: Ambulatory Visit | Attending: Women's Health | Admitting: Women's Health

## 2015-02-24 ENCOUNTER — Ambulatory Visit (INDEPENDENT_AMBULATORY_CARE_PROVIDER_SITE_OTHER): Payer: 59 | Admitting: Women's Health

## 2015-02-24 ENCOUNTER — Encounter: Payer: Self-pay | Admitting: Women's Health

## 2015-02-24 VITALS — BP 120/84 | Ht 63.0 in | Wt 128.0 lb

## 2015-02-24 DIAGNOSIS — Z79890 Hormone replacement therapy (postmenopausal): Secondary | ICD-10-CM | POA: Diagnosis not present

## 2015-02-24 DIAGNOSIS — Z01419 Encounter for gynecological examination (general) (routine) without abnormal findings: Secondary | ICD-10-CM

## 2015-02-24 DIAGNOSIS — B009 Herpesviral infection, unspecified: Secondary | ICD-10-CM

## 2015-02-24 DIAGNOSIS — M8580 Other specified disorders of bone density and structure, unspecified site: Secondary | ICD-10-CM

## 2015-02-24 DIAGNOSIS — M899 Disorder of bone, unspecified: Secondary | ICD-10-CM | POA: Diagnosis not present

## 2015-02-24 DIAGNOSIS — Z13820 Encounter for screening for osteoporosis: Secondary | ICD-10-CM | POA: Diagnosis not present

## 2015-02-24 DIAGNOSIS — Z1151 Encounter for screening for human papillomavirus (HPV): Secondary | ICD-10-CM | POA: Diagnosis not present

## 2015-02-24 LAB — URINALYSIS W MICROSCOPIC + REFLEX CULTURE
BILIRUBIN URINE: NEGATIVE
Bacteria, UA: NONE SEEN [HPF]
CASTS: NONE SEEN [LPF]
CRYSTALS: NONE SEEN [HPF]
Glucose, UA: NEGATIVE
HGB URINE DIPSTICK: NEGATIVE
KETONES UR: NEGATIVE
Leukocytes, UA: NEGATIVE
Nitrite: NEGATIVE
Protein, ur: NEGATIVE
RBC / HPF: NONE SEEN RBC/HPF (ref <=2)
SPECIFIC GRAVITY, URINE: 1.009 (ref 1.001–1.035)
Squamous Epithelial / LPF: NONE SEEN [HPF] (ref <=5)
WBC, UA: NONE SEEN WBC/HPF (ref <=5)
YEAST: NONE SEEN [HPF]
pH: 6 (ref 5.0–8.0)

## 2015-02-24 MED ORDER — VALACYCLOVIR HCL 500 MG PO TABS: ORAL_TABLET | ORAL | Status: DC

## 2015-02-24 MED ORDER — ACTIVELLA 0.5-0.1 MG PO TABS: 1.0000 | ORAL_TABLET | Freq: Every day | ORAL | Status: DC

## 2015-02-24 NOTE — Patient Instructions (Signed)
Osteoporosis Osteoporosis is the thinning and loss of density in the bones. Osteoporosis makes the bones more brittle, fragile, and likely to break (fracture). Over time, osteoporosis can cause the bones to become so weak that they fracture after a simple fall. The bones most likely to fracture are the bones in the hip, wrist, and spine. CAUSES  The exact cause is not known. RISK FACTORS Anyone can develop osteoporosis. You may be at greater risk if you have a family history of the condition or have poor nutrition. You may also have a higher risk if you are:   Female.   50 years old or older.  A smoker.  Not physically active.   White or Asian.  Slender. SIGNS AND SYMPTOMS  A fracture might be the first sign of the disease, especially if it results from a fall or injury that would not usually cause a bone to break. Other signs and symptoms include:   Low back and neck pain.  Stooped posture.  Height loss. DIAGNOSIS  To make a diagnosis, your health care provider may:  Take a medical history.  Perform a physical exam.  Order tests, such as:  A bone mineral density test.  A dual-energy X-ray absorptiometry test. TREATMENT  The goal of osteoporosis treatment is to strengthen your bones to reduce your risk of a fracture. Treatment may involve:  Making lifestyle changes, such as:  Eating a diet rich in calcium.  Doing weight-bearing and muscle-strengthening exercises.  Stopping tobacco use.  Limiting alcohol intake.  Taking medicine to slow the process of bone loss or to increase bone density.  Monitoring your levels of calcium and vitamin D. HOME CARE INSTRUCTIONS  Include calcium and vitamin D in your diet. Calcium is important for bone health, and vitamin D helps the body absorb calcium.  Perform weight-bearing and muscle-strengthening exercises as directed by your health care provider.  Do not use any tobacco products, including cigarettes, chewing  tobacco, and electronic cigarettes. If you need help quitting, ask your health care provider.  Limit your alcohol intake.  Take medicines only as directed by your health care provider.  Keep all follow-up visits as directed by your health care provider. This is important.  Take precautions at home to lower your risk of falling, such as:  Keeping rooms well lit and clutter free.  Installing safety rails on stairs.  Using rubber mats in the bathroom and other areas that are often wet or slippery. SEEK IMMEDIATE MEDICAL CARE IF:  You fall or injure yourself.    This information is not intended to replace advice given to you by your health care provider. Make sure you discuss any questions you have with your health care provider.   Document Released: 11/07/2004 Document Revised: 02/18/2014 Document Reviewed: 07/08/2013 Elsevier Interactive Patient Education 2016 Elsevier Inc. Menopause is a normal process in which your reproductive ability comes to an end. This process happens gradually over a span of months to years, usually between the ages of 48 and 55. Menopause is complete when you have missed 12 consecutive menstrual periods. It is important to talk with your health care provider about some of the most common conditions that affect postmenopausal women, such as heart disease, cancer, and bone loss (osteoporosis). Adopting a healthy lifestyle and getting preventive care can help to promote your health and wellness. Those actions can also lower your chances of developing some of these common conditions. WHAT SHOULD I KNOW ABOUT MENOPAUSE? During menopause, you may experience a   number of symptoms, such as:  Moderate-to-severe hot flashes.  Night sweats.  Decrease in sex drive.  Mood swings.  Headaches.  Tiredness.  Irritability.  Memory problems.  Insomnia. Choosing to treat or not to treat menopausal changes is an individual decision that you make with your health care  provider. WHAT SHOULD I KNOW ABOUT HORMONE REPLACEMENT THERAPY AND SUPPLEMENTS? Hormone therapy products are effective for treating symptoms that are associated with menopause, such as hot flashes and night sweats. Hormone replacement carries certain risks, especially as you become older. If you are thinking about using estrogen or estrogen with progestin treatments, discuss the benefits and risks with your health care provider. WHAT SHOULD I KNOW ABOUT HEART DISEASE AND STROKE? Heart disease, heart attack, and stroke become more likely as you age. This may be due, in part, to the hormonal changes that your body experiences during menopause. These can affect how your body processes dietary fats, triglycerides, and cholesterol. Heart attack and stroke are both medical emergencies. There are many things that you can do to help prevent heart disease and stroke:  Have your blood pressure checked at least every 1-2 years. High blood pressure causes heart disease and increases the risk of stroke.  If you are 55-79 years old, ask your health care provider if you should take aspirin to prevent a heart attack or a stroke.  Do not use any tobacco products, including cigarettes, chewing tobacco, or electronic cigarettes. If you need help quitting, ask your health care provider.  It is important to eat a healthy diet and maintain a healthy weight.  Be sure to include plenty of vegetables, fruits, low-fat dairy products, and lean protein.  Avoid eating foods that are high in solid fats, added sugars, or salt (sodium).  Get regular exercise. This is one of the most important things that you can do for your health.  Try to exercise for at least 150 minutes each week. The type of exercise that you do should increase your heart rate and make you sweat. This is known as moderate-intensity exercise.  Try to do strengthening exercises at least twice each week. Do these in addition to the moderate-intensity  exercise.  Know your numbers.Ask your health care provider to check your cholesterol and your blood glucose. Continue to have your blood tested as directed by your health care provider. WHAT SHOULD I KNOW ABOUT CANCER SCREENING? There are several types of cancer. Take the following steps to reduce your risk and to catch any cancer development as early as possible. Breast Cancer  Practice breast self-awareness.  This means understanding how your breasts normally appear and feel.  It also means doing regular breast self-exams. Let your health care provider know about any changes, no matter how small.  If you are 40 or older, have a clinician do a breast exam (clinical breast exam or CBE) every year. Depending on your age, family history, and medical history, it may be recommended that you also have a yearly breast X-ray (mammogram).  If you have a family history of breast cancer, talk with your health care provider about genetic screening.  If you are at high risk for breast cancer, talk with your health care provider about having an MRI and a mammogram every year.  Breast cancer (BRCA) gene test is recommended for women who have family members with BRCA-related cancers. Results of the assessment will determine the need for genetic counseling and BRCA1 and for BRCA2 testing. BRCA-related cancers include these types:    Breast. This occurs in males or females.  Ovarian.  Tubal. This may also be called fallopian tube cancer.  Cancer of the abdominal or pelvic lining (peritoneal cancer).  Prostate.  Pancreatic. Cervical, Uterine, and Ovarian Cancer Your health care provider may recommend that you be screened regularly for cancer of the pelvic organs. These include your ovaries, uterus, and vagina. This screening involves a pelvic exam, which includes checking for microscopic changes to the surface of your cervix (Pap test).  For women ages 21-65, health care providers may recommend a  pelvic exam and a Pap test every three years. For women ages 76-65, they may recommend the Pap test and pelvic exam, combined with testing for human papilloma virus (HPV), every five years. Some types of HPV increase your risk of cervical cancer. Testing for HPV may also be done on women of any age who have unclear Pap test results.  Other health care providers may not recommend any screening for nonpregnant women who are considered low risk for pelvic cancer and have no symptoms. Ask your health care provider if a screening pelvic exam is right for you.  If you have had past treatment for cervical cancer or a condition that could lead to cancer, you need Pap tests and screening for cancer for at least 20 years after your treatment. If Pap tests have been discontinued for you, your risk factors (such as having a new sexual partner) need to be reassessed to determine if you should start having screenings again. Some women have medical problems that increase the chance of getting cervical cancer. In these cases, your health care provider may recommend that you have screening and Pap tests more often.  If you have a family history of uterine cancer or ovarian cancer, talk with your health care provider about genetic screening.  If you have vaginal bleeding after reaching menopause, tell your health care provider.  There are currently no reliable tests available to screen for ovarian cancer. Lung Cancer Lung cancer screening is recommended for adults 61-18 years old who are at high risk for lung cancer because of a history of smoking. A yearly low-dose CT scan of the lungs is recommended if you:  Currently smoke.  Have a history of at least 30 pack-years of smoking and you currently smoke or have quit within the past 15 years. A pack-year is smoking an average of one pack of cigarettes per day for one year. Yearly screening should:  Continue until it has been 15 years since you quit.  Stop if you  develop a health problem that would prevent you from having lung cancer treatment. Colorectal Cancer  This type of cancer can be detected and can often be prevented.  Routine colorectal cancer screening usually begins at age 28 and continues through age 29.  If you have risk factors for colon cancer, your health care provider may recommend that you be screened at an earlier age.  If you have a family history of colorectal cancer, talk with your health care provider about genetic screening.  Your health care provider may also recommend using home test kits to check for hidden blood in your stool.  A small camera at the end of a tube can be used to examine your colon directly (sigmoidoscopy or colonoscopy). This is done to check for the earliest forms of colorectal cancer.  Direct examination of the colon should be repeated every 5-10 years until age 9. However, if early forms of precancerous polyps or small  growths are found or if you have a family history or genetic risk for colorectal cancer, you may need to be screened more often. Skin Cancer  Check your skin from head to toe regularly.  Monitor any moles. Be sure to tell your health care provider:  About any new moles or changes in moles, especially if there is a change in a mole's shape or color.  If you have a mole that is larger than the size of a pencil eraser.  If any of your family members has a history of skin cancer, especially at a Heather Chambers age, talk with your health care provider about genetic screening.  Always use sunscreen. Apply sunscreen liberally and repeatedly throughout the day.  Whenever you are outside, protect yourself by wearing long sleeves, pants, a wide-brimmed hat, and sunglasses. WHAT SHOULD I KNOW ABOUT OSTEOPOROSIS? Osteoporosis is a condition in which bone destruction happens more quickly than new bone creation. After menopause, you may be at an increased risk for osteoporosis. To help prevent  osteoporosis or the bone fractures that can happen because of osteoporosis, the following is recommended:  If you are 21-6 years old, get at least 1,000 mg of calcium and at least 600 mg of vitamin D per day.  If you are older than age 32 but younger than age 23, get at least 1,200 mg of calcium and at least 600 mg of vitamin D per day.  If you are older than age 75, get at least 1,200 mg of calcium and at least 800 mg of vitamin D per day. Smoking and excessive alcohol intake increase the risk of osteoporosis. Eat foods that are rich in calcium and vitamin D, and do weight-bearing exercises several times each week as directed by your health care provider. WHAT SHOULD I KNOW ABOUT HOW MENOPAUSE AFFECTS [place]? Depression may occur at any age, but it is more common as you become older. Common symptoms of depression include:  Low or sad mood.  Changes in sleep patterns.  Changes in appetite or eating patterns.  Feeling an overall lack of motivation or enjoyment of activities that you previously enjoyed.  Frequent crying spells. Talk with your health care provider if you think that you are experiencing depression. WHAT SHOULD I KNOW ABOUT IMMUNIZATIONS? It is important that you get and maintain your immunizations. These include:  Tetanus, diphtheria, and pertussis (Tdap) booster vaccine.  Influenza every year before the flu season begins.  Pneumonia vaccine.  Shingles vaccine. Your health care provider may also recommend other immunizations.   This information is not intended to replace advice given to you by your health care provider. Make sure you discuss any questions you have with your health care provider.   Document Released: 03/22/2005 Document Revised: 02/18/2014 Document Reviewed: 09/30/2013 Elsevier Interactive Patient Education [employer].

## 2015-02-24 NOTE — Progress Notes (Signed)
Heather Chambers 12-May-1955 [id number]    History:    Presents for annual exam. Postmenopausal on HRT with no bleeding. Normal Pap and mammogram history. Primary care manages hypothyroidism. 2009 negative colonoscopy. 2014 T score -1.5 at spine, hip average 0.6. HSV history rare outbreaks.   Past medical history, past surgical history, family history and social history were all reviewed and documented in the EPIC chart. Works at [employer]. 58 year old son getting married next year.  ROS:  A ROS was performed and pertinent positives and negatives are included.  Exam:  Filed Vitals:   02/24/15 0848  BP: 120/84    General appearance:  Normal Thyroid:  Symmetrical, normal in size, without palpable masses or nodularity. Respiratory  Auscultation:  Clear without wheezing or rhonchi Cardiovascular  Auscultation:  Regular rate, without rubs, murmurs or gallops  Edema/varicosities:  Not grossly evident Abdominal  Soft,nontender, without masses, guarding or rebound.  Liver/spleen:  No organomegaly noted  Hernia:  None appreciated  Skin  Inspection:  Grossly normal   Breasts: Examined lying and sitting.     Right: Without masses, retractions, discharge or axillary adenopathy.     Left: Without masses, retractions, discharge or axillary adenopathy. Gentitourinary   Inguinal/mons:  Normal without inguinal adenopathy  External genitalia:  Normal  BUS/Urethra/Skene's glands:  Normal  Vagina:  Atrophic  Cervix:  Normal  Uterus:   normal in size, shape and contour.  Midline and mobile  Adnexa/parametria:     Rt: Without masses or tenderness.   Lt: Without masses or tenderness.  Anus and perineum: Normal  Digital rectal exam: Normal sphincter tone without palpated masses or tenderness  Assessment/Plan:  60 y.o. MWF G1 P1 for annual exam with complaint of vaginal dryness.  Postmenopausal on HRT with no bleeding Vaginal atrophy/dyspareunia Osteopenia without elevated  FRAX Hypothyroid-primary care manages HSV rare outbreaks  Plan: SBE's, continue annual 3-D screening mammogram history of dense breasts. Activella 0.5/0.1 prescription, proper use given and reviewed slight risk for blood clots and strokes. Continue vaginal lubricants for dryness, regular exercise, calcium rich diet, vitamin D 2000 and over-the-counter encouraged. We'll have vitamin D level checked at primary care. UA, Pap with HR HPV typing, new screening guidelines reviewed. Repeat DEXA. Home safety, fall prevention and importance of regular exercise reviewed.    [name],Heather Chambers [facility] LLCWHNP, 11:34 AM 02/24/2015

## 2015-02-27 ENCOUNTER — Ambulatory Visit (INDEPENDENT_AMBULATORY_CARE_PROVIDER_SITE_OTHER): Payer: 59 | Admitting: Family Medicine

## 2015-02-27 ENCOUNTER — Encounter: Payer: Self-pay | Admitting: Family Medicine

## 2015-02-27 VITALS — BP 121/81 | HR 98 | Temp 98.2°F | Resp 20 | Wt 127.0 lb

## 2015-02-27 DIAGNOSIS — H8111 Benign paroxysmal vertigo, right ear: Secondary | ICD-10-CM

## 2015-02-27 DIAGNOSIS — J0100 Acute maxillary sinusitis, unspecified: Secondary | ICD-10-CM | POA: Diagnosis not present

## 2015-02-27 DIAGNOSIS — J302 Other seasonal allergic rhinitis: Secondary | ICD-10-CM | POA: Diagnosis not present

## 2015-02-27 MED ORDER — PREDNISONE 20 MG PO TABS: ORAL_TABLET | ORAL | Status: DC

## 2015-02-27 MED ORDER — AMOXICILLIN-POT CLAVULANATE 875-125 MG PO TABS: 1.0000 | ORAL_TABLET | Freq: Two times a day (BID) | ORAL | Status: DC

## 2015-02-27 NOTE — Patient Instructions (Signed)
Benign Positional Vertigo Vertigo is the feeling that you or your surroundings are moving when they are not. Benign positional vertigo is the most common form of vertigo. The cause of this condition is not serious (is benign). This condition is triggered by certain movements and positions (is positional). This condition can be dangerous if it occurs while you are doing something that could endanger you or others, such as driving.  CAUSES In many cases, the cause of this condition is not known. It may be caused by a disturbance in an area of the inner ear that helps your brain to sense movement and balance. This disturbance can be caused by a viral infection (labyrinthitis), head injury, or repetitive motion. RISK FACTORS This condition is more likely to develop in:  Women.  People who are 50 years of age or older. SYMPTOMS Symptoms of this condition usually happen when you move your head or your eyes in different directions. Symptoms may start suddenly, and they usually last for less than a minute. Symptoms may include:  Loss of balance and falling.  Feeling like you are spinning or moving.  Feeling like your surroundings are spinning or moving.  Nausea and vomiting.  Blurred vision.  Dizziness.  Involuntary eye movement (nystagmus). Symptoms can be mild and cause only slight annoyance, or they can be severe and interfere with daily life. Episodes of benign positional vertigo may return (recur) over time, and they may be triggered by certain movements. Symptoms may improve over time. DIAGNOSIS This condition is usually diagnosed by medical history and a physical exam of the head, neck, and ears. You may be referred to a health care provider who specializes in ear, nose, and throat (ENT) problems (otolaryngologist) or a provider who specializes in disorders of the nervous system (neurologist). You may have additional testing, including:  MRI.  A CT scan.  Eye movement tests. Your  health care provider may ask you to change positions quickly while he or she watches you for symptoms of benign positional vertigo, such as nystagmus. Eye movement may be tested with an electronystagmogram (ENG), caloric stimulation, the Dix-Hallpike test, or the roll test.  An electroencephalogram (EEG). This records electrical activity in your brain.  Hearing tests. TREATMENT Usually, your health care provider will treat this by moving your head in specific positions to adjust your inner ear back to normal. Surgery may be needed in severe cases, but this is rare. In some cases, benign positional vertigo may resolve on its own in 2-4 weeks. HOME CARE INSTRUCTIONS Safety  Move slowly.Avoid sudden body or head movements.  Avoid driving.  Avoid operating heavy machinery.  Avoid doing any tasks that would be dangerous to you or others if a vertigo episode would occur.  If you have trouble walking or keeping your balance, try using a cane for stability. If you feel dizzy or unstable, sit down right away.  Return to your normal activities as told by your health care provider. Ask your health care provider what activities are safe for you. General Instructions  Take over-the-counter and prescription medicines only as told by your health care provider.  Avoid certain positions or movements as told by your health care provider.  Drink enough fluid to keep your urine clear or pale yellow.  Keep all follow-up visits as told by your health care provider. This is important. SEEK MEDICAL CARE IF:  You have a fever.  Your condition gets worse or you develop new symptoms.  Your family or friends   notice any behavioral changes.  Your nausea or vomiting gets worse.  You have numbness or a "pins and needles" sensation. SEEK IMMEDIATE MEDICAL CARE IF:  You have difficulty speaking or moving.  You are always dizzy.  You faint.  You develop severe headaches.  You have weakness in your  legs or arms.  You have changes in your hearing or vision.  You develop a stiff neck.  You develop sensitivity to light.   This information is not intended to replace advice given to you by your health care provider. Make sure you discuss any questions you have with your health care provider.   Document Released: 11/05/2005 Document Revised: 10/19/2014 Document Reviewed: 05/23/2014 Elsevier Interactive Patient Education 2016 Elsevier Inc.  

## 2015-02-27 NOTE — Progress Notes (Signed)
Patient ID: Heather Chambers, female   DOB: [birth year], 60 y.o.   MRN: [id number]   Subjective:    Patient ID: Heather LimboJill L Chambers   DOB: [birth year], 60 y.o.    MRN: [id number]  HPI  Sinusitis: Pt presents with a 1 week history of facial pressure over sinuses, dizziness and now PND. She endorses nausea, but denies fever, chills or cough.  She reports the dizziness came on suddenly when trying to sit up after awakening in the morning on Saturday. She had taken a few attempts to sit up, because movement caused the room to spin. The symptoms are worse in the morning and are improving with prednisone use and Afrin use. Patient states the dizzy spells only occur for a few seconds at a time. The facial pressure remains. She has been seeing an allergist for her allergies and feels since seeing them she has started to have an increase in sinus infections.She has been taking the singular and dymista as prescribed and is using 20 mg QD of her husbands prednisone the last 2 days.  - allergist: [facility]; patient desires new referral - Started nasal saline, afrin Past Medical History  Diagnosis Date  . Chronic rhinitis   . Health maintenance examination   . Hypothyroidism   . Chronic pelvic pain in female 1995    LLQ  . Menopausal symptoms   . Osteoporosis     Osteopenia  . Allergy   . Migraine    Allergies  Allergen Reactions  . Cefdinir   . Etodolac     "LODINE"  . Phenergan [Promethazine Hcl]     Restlessness, twitching   Past Surgical History  Procedure Laterality Date  . Pelvic laparoscopy      DIAG LAP   Social History  Substance Use Topics  . Smoking status: Never Smoker   . Smokeless tobacco: Never Used  . Alcohol Use: 2.4 oz/week    4 Cans of beer per week    Review of Systems Negative, with the exception of above mentioned in HPI     Objective:   Physical Exam BP 121/81 mmHg  Pulse 98  Temp(Src) 98.2 F (36.8 C) (Oral)  Resp 20  Wt 127 lb (57.607 kg)  SpO2 97% Body mass index is  22.5 kg/(m^2). Gen: Afebrile. No acute distress. Nontoxic in appearance. Well developed, well nourished, doesn't Caucasian female. HENT: AT. [place]. Bilateral TM visualized, full/no erythema or bulging. MMM, no oral lesions. Bilateral nares with mild erythema, excoriations right nare, no swelling. Throat without erythema, no exudates. Cough mild, Hoarseness no, TTP maxillary and frontal sinus. Eyes:Pupils Equal Round Reactive to light, Extraocular movements intact,  Conjunctiva without redness, discharge or icterus. Neck/lymp/endocrine: Supple, bilateral anterior cervical lymphadenopathy CV: RRR Chest: CTAB, no wheeze or crackles Abd: Soft. NTND. BS present Skin: No rashes, purpura or petechiae.    Assessment & Plan:  Heather Chambers is a 60 y.o. present for acute OV  1. Acute maxillary sinusitis, recurrence not specified - Dizziness partially due to sinusitis versus BPPV. Will treat sinusitis with Augmentin and prednisone. - refer to new allergist, although if sinus problems persist may need ENT - amoxicillin-clavulanate (AUGMENTIN) 875-125 MG tablet; Take 1 tablet by mouth 2 (two) times daily.  Dispense: 20 tablet; Refill: 0 - predniSONE (DELTASONE) 20 MG tablet; 40 mg x 2d, 20 mg x2 d, 10 mg x2 days  Dispense: 8 tablet; Refill: 0  2. BPPV: BPPV exercises given to patient today. She was advised to stay well-hydrated.  Does not seem to need meclizine at this time, dizziness spells are triggered with movement especially to the right and are very short-term. - AVS on BPPV given

## 2015-02-28 LAB — CYTOLOGY - PAP

## 2015-03-01 ENCOUNTER — Telehealth: Payer: Self-pay | Admitting: Family Medicine

## 2015-03-01 ENCOUNTER — Encounter: Payer: Self-pay | Admitting: *Deleted

## 2015-03-01 MED ORDER — MECLIZINE HCL 25 MG PO TABS: 25.0000 mg | ORAL_TABLET | Freq: Three times a day (TID) | ORAL | Status: DC | PRN

## 2015-03-01 NOTE — Telephone Encounter (Signed)
Antivert prescribed for BPPV. Pt will need to follow up of vertigo continues and she is needing refills.

## 2015-03-01 NOTE — Telephone Encounter (Signed)
Patient left a VM on front desk phone asking for Rx to be sent to [facility] for antivert. She is leaving soon to go take care of her mother & she needs medicine for vertigo.

## 2015-03-02 NOTE — Telephone Encounter (Signed)
Patient aware.

## 2015-03-03 ENCOUNTER — Encounter: Payer: Self-pay | Admitting: Family Medicine

## 2015-03-15 ENCOUNTER — Ambulatory Visit
Admission: RE | Admit: 2015-03-15 | Discharge: 2015-03-15 | Disposition: A | Discharge status: Home / Self Care | Payer: 59 | Source: Ambulatory Visit

## 2015-03-15 DIAGNOSIS — Z1231 Encounter for screening mammogram for malignant neoplasm of breast: Secondary | ICD-10-CM

## 2015-03-16 ENCOUNTER — Ambulatory Visit: Payer: 59 | Admitting: Allergy and Immunology

## 2015-03-20 ENCOUNTER — Other Ambulatory Visit: Payer: Self-pay | Admitting: Family Medicine

## 2015-05-30 ENCOUNTER — Ambulatory Visit (INDEPENDENT_AMBULATORY_CARE_PROVIDER_SITE_OTHER): Payer: 59 | Admitting: Family Medicine

## 2015-05-30 ENCOUNTER — Telehealth: Payer: Self-pay | Admitting: Family Medicine

## 2015-05-30 ENCOUNTER — Encounter: Payer: Self-pay | Admitting: Family Medicine

## 2015-05-30 DIAGNOSIS — J0100 Acute maxillary sinusitis, unspecified: Secondary | ICD-10-CM

## 2015-05-30 MED ORDER — METHYLPREDNISOLONE ACETATE 80 MG/ML IJ SUSP
80.0000 mg | Freq: Once | INTRAMUSCULAR | Status: AC
  Administered 2015-05-30: 80 mg via INTRAMUSCULAR

## 2015-05-30 MED ORDER — BENZONATATE 200 MG PO CAPS: 200.0000 mg | ORAL_CAPSULE | Freq: Two times a day (BID) | ORAL | Status: DC | PRN

## 2015-05-30 MED ORDER — DOXYCYCLINE HYCLATE 100 MG PO TABS: 100.0000 mg | ORAL_TABLET | Freq: Two times a day (BID) | ORAL | Status: DC

## 2015-05-30 MED ORDER — MOMETASONE FUROATE 50 MCG/ACT NA SUSP: 2.0000 | Freq: Every day | NASAL | Status: DC

## 2015-05-30 MED ORDER — HYDROCODONE-HOMATROPINE 5-1.5 MG/5ML PO SYRP: 5.0000 mL | ORAL_SOLUTION | Freq: Three times a day (TID) | ORAL | Status: DC | PRN

## 2015-05-30 NOTE — Telephone Encounter (Signed)
Spoke with patient she states she is using all her medications and is still not having any relief from symptoms. Advised patient to schedule appt for evaluation.

## 2015-05-30 NOTE — Patient Instructions (Signed)
Consider rescheduling allergy appt and ENT if symptoms continue, after wedding.  - doxycyline 7 days - IM depo medrol 80 mg. (steroid shot today) - tessalon Perles/hycodan--> cough -rest and hydrate.  - continue mucinex, nasal saline and allergy regimen. Called in nasonex as as well.

## 2015-05-30 NOTE — Progress Notes (Signed)
Patient ID: Heather Chambers, female   DOB: [birth year], 60 y.o.   MRN: [id number]   Subjective:    Patient ID: Heather Chambers   DOB: 1955-09-16, 60 y.o.    MRN: [id number]  HPI  Sinusitis: Pt presents with a 10 day  history of facial pressure over sinuses, , sore throat, PND, chest congestion and hot flashes. She denies nausea, vomit, dizziness or diarrhea. She has been seeing an allergist for her allergies and feels since seeing them she has started to have an increase in sinus infections. She was referred to new allergist after last appt, but was unable to schedule. She has a wedding coming up this weekend and after wedding is over she will consider allergy appt again. She has been taking the singular,xyzal and dymista as prescribed. She is also using Alk cold and cough.  Past Medical History  Diagnosis Date  . Chronic rhinitis   . Health maintenance examination   . Hypothyroidism   . Chronic pelvic pain in female 1995    LLQ  . Menopausal symptoms   . Osteoporosis     Osteopenia  . Allergy   . Migraine    Allergies  Allergen Reactions  . Cefdinir   . Etodolac     "LODINE"  . Phenergan [Promethazine Hcl]     Restlessness, twitching   Past Surgical History  Procedure Laterality Date  . Pelvic laparoscopy      DIAG LAP   Social History  Substance Use Topics  . Smoking status: Never Smoker   . Smokeless tobacco: Never Used  . Alcohol Use: 2.4 oz/week    4 Cans of beer per week    Review of Systems Negative, with the exception of above mentioned in HPI     Objective:   BP 120/73 mmHg  Pulse 89  Temp(Src) 98.1 F (36.7 C)  Resp 20  Wt 120 lb 12 oz (54.772 kg)  SpO2 96% Body mass index is 21.4 kg/(m^2). Gen: Afebrile. No acute distress. Nontoxic in appearance. Well developed, well nourished, doesn't Caucasian female. HENT: AT. [place]. Bilateral TM visualized, full/no erythema or bulging. MMM, no oral lesions. Bilateral nares with mild erythema,no swelling. Throat without  erythema, no exudates. Cough mild, Hoarseness no, TTP maxillary and frontal sinus. Eyes:Pupils Equal Round Reactive to light, Extraocular movements intact,  Conjunctiva without redness, discharge or icterus. Neck/lymp/endocrine: Supple, left anterior cervical lymphadenopathy CV: RRR Chest: CTAB, no wheeze or crackles Abd: Soft. NTND. BS present Skin: No rashes, purpura or petechiae.    Assessment & Plan:  Heather Chambers is a 60 y.o. present for acute OV  1. Acute maxillary sinusitis, recurrence not specified - Consider rescheduling allergy appt and ENT after wedding.  - doxycyline 7 days - IM depo medrol 80 mg.  - tessalon Perles/hycodan -rest and hydrate.  - continue mucinex, nasal saline and allergy regimen. Called in nasonex as as well.  - F/U PRN  Electronically Signed by: Howard Pouch, DO [facility]- [place]

## 2015-05-30 NOTE — Telephone Encounter (Signed)
Patient's son is getting married this weekend. She has had a could for 12 days & cannot stop coughing. Is there anything that she can take that will help stop the cough? Please call.

## 2015-07-03 ENCOUNTER — Ambulatory Visit (INDEPENDENT_AMBULATORY_CARE_PROVIDER_SITE_OTHER): Payer: 59 | Admitting: Allergy and Immunology

## 2015-07-03 ENCOUNTER — Encounter: Payer: Self-pay | Admitting: Allergy and Immunology

## 2015-07-03 VITALS — BP 108/70 | HR 64 | Temp 98.8°F | Resp 16 | Ht 62.5 in | Wt 121.0 lb

## 2015-07-03 DIAGNOSIS — J321 Chronic frontal sinusitis: Secondary | ICD-10-CM | POA: Insufficient documentation

## 2015-07-03 DIAGNOSIS — J3089 Other allergic rhinitis: Secondary | ICD-10-CM | POA: Diagnosis not present

## 2015-07-03 LAB — CBC WITH DIFFERENTIAL/PLATELET
Basophils Absolute: 0 cells/uL (ref 0–200)
Basophils Relative: 0 %
Eosinophils Absolute: 90 cells/uL (ref 15–500)
Eosinophils Relative: 1 %
HCT: 40.8 % (ref 35.0–45.0)
Hemoglobin: 13.4 g/dL (ref 11.7–15.5)
Lymphocytes Relative: 27 %
Lymphs Abs: 2430 cells/uL (ref 850–3900)
MCH: 29.6 pg (ref 27.0–33.0)
MCHC: 32.8 g/dL (ref 32.0–36.0)
MCV: 90.3 fL (ref 80.0–100.0)
MPV: 10 fL (ref 7.5–12.5)
Monocytes Absolute: 630 cells/uL (ref 200–950)
Monocytes Relative: 7 %
Neutro Abs: 5850 cells/uL (ref 1500–7800)
Neutrophils Relative %: 65 %
Platelets: 375 10*3/uL (ref 140–400)
RBC: 4.52 MIL/uL (ref 3.80–5.10)
RDW: 14 % (ref 11.0–15.0)
WBC: 9 10*3/uL (ref 3.8–10.8)

## 2015-07-03 MED ORDER — IPRATROPIUM BROMIDE 0.06 % NA SOLN: 2.0000 | Freq: Three times a day (TID) | NASAL | Status: DC

## 2015-07-03 MED ORDER — BECLOMETHASONE DIPROPIONATE 80 MCG/ACT NA AERS: 1.0000 | INHALATION_SPRAY | Freq: Every day | NASAL | Status: DC

## 2015-07-03 NOTE — Patient Instructions (Addendum)
Allergic rhinitis with probable nonallergic component Heather Chambers is not nonideal candidate for aeroallergen immunotherapy, as such this therapy will be considered as a means of last resort.    Continue appropriate allergen avoidance measures.  A sample and prescription have been provided for Qnasl 80 g, one actuation per nostril twice daily as needed.  Proper technique has been discussed and demonstrated.  A prescription has been provided for ipratropium 0.06%, 2 sprays per nostril up to 3 times daily as needed.  Continue nasal saline irrigation as needed.  Guaifenesin 1200 mg (plus/minus pseudoephedrine 120 mg) twice daily as needed with adequate hydration. Pseudoephedrine is only to be used for short-term relief of nasal/sinus congestion. Long-term use is discouraged due to potential side effects.   Recurrent sinusitis  Treatment plan as outlined above for mixed rhinitis.  To assess immunocompetence, the following labs have been ordered: CBC with differential, IgG, IgA and IgM as well as tetanus IgG and pneumococcal IgG titers. Post-vaccination titers will be drawn to assess response if IgG and pre-vaccination titers are low.    The patient will be called with further recommendations and follow-up instructions once the labs have returned.  If labs are unrevealing and this problem persists or progresses despite allergen avoidance and optimized medications, Heather Chambers will be referred to otolaryngology for further evaluation and treatment.   When lab results have returned the patient will be called with further recommendations and follow up instructions.

## 2015-07-03 NOTE — Assessment & Plan Note (Signed)
Heather LarssonJill is not nonideal candidate for aeroallergen immunotherapy, as such this therapy will be considered as a means of last resort.    Continue appropriate allergen avoidance measures.  A sample and prescription have been provided for Qnasl 80 g, one actuation per nostril twice daily as needed.  Proper technique has been discussed and demonstrated.  A prescription has been provided for ipratropium 0.06%, 2 sprays per nostril up to 3 times daily as needed.  Continue nasal saline irrigation as needed.  Guaifenesin 1200 mg (plus/minus pseudoephedrine 120 mg) twice daily as needed with adequate hydration. Pseudoephedrine is only to be used for short-term relief of nasal/sinus congestion. Long-term use is discouraged due to potential side effects.

## 2015-07-03 NOTE — Progress Notes (Signed)
New Patient Note  RE: Heather Chambers MRN: [id number] DOB: [birth year] Date of Office Visit: 07/03/2015  Referring provider: Natalia Leatherwood, DO Primary care provider: Felix Pacini, DO  Chief Complaint: Sinus Problem   History of present illness: HPI Comments: Heather Chambers is a 60 y.o. female presenting today for consultation of rhinosinusitis.  She was evaluated by Dr. Northway Callas a year ago and is here for a second opinion.  Despite allergen avoidance measures and compliance with prescribed medications, she experiences frequent nasal congestion, postnasal drainage, rhinorrhea, and sinus pressure/pain particularly over the forehead and cheekbones.  She reports that she had 4 sinus infections last year requiring antibiotics and/or corticosteroids.  She was encouraged to start aeroallergen immunotherapy however, based upon a previous experience with large local reactions associated with immunotherapy and what she considered to be underwhelming skin test results, she did not wish to initiate allergy injections.  She has tried cetirizine, levocetirizine, Nasonex, both brand name and generic, fluticasone nasal spray, and Dymista nasal spray without adequate symptom relief.   Assessment and plan: Allergic rhinitis with probable nonallergic component Brie is not nonideal candidate for aeroallergen immunotherapy, as such this therapy will be considered as a means of last resort.    Continue appropriate allergen avoidance measures.  A sample and prescription have been provided for Qnasl 80 g, one actuation per nostril twice daily as needed.  Proper technique has been discussed and demonstrated.  A prescription has been provided for ipratropium 0.06%, 2 sprays per nostril up to 3 times daily as needed.  Continue nasal saline irrigation as needed.  Guaifenesin 1200 mg (plus/minus pseudoephedrine 120 mg) twice daily as needed with adequate hydration. Pseudoephedrine is only to be used for short-term relief  of nasal/sinus congestion. Long-term use is discouraged due to potential side effects.   Recurrent sinusitis  Treatment plan as outlined above for mixed rhinitis.  To assess immunocompetence, the following labs have been ordered: CBC with differential, IgG, IgA and IgM as well as tetanus IgG and pneumococcal IgG titers. Post-vaccination titers will be drawn to assess response if IgG and pre-vaccination titers are low.    The patient will be called with further recommendations and follow-up instructions once the labs have returned.  If labs are unrevealing and this problem persists or progresses despite allergen avoidance and optimized medications, [name] will be referred to otolaryngology for further evaluation and treatment.    Meds ordered this encounter  Medications  . Beclomethasone Dipropionate (QNASL) 80 MCG/ACT AERS    Sig: Place 1 spray into the nose daily.    Dispense:  8.7 g    Refill:  5  . ipratropium (ATROVENT) 0.06 % nasal spray    Sig: Place 2 sprays into both nostrils 3 (three) times daily.    Dispense:  15 mL    Refill:  5       Physical examination: Blood pressure 108/70, pulse 64, temperature 98.8 F (37.1 C), temperature source Oral, resp. rate 16, height 5' 2.5" (1.588 m), weight 121 lb (54.885 kg).  General: Alert, interactive, in no acute distress. HEENT: TMs pearly gray, turbinates moderately edematous without discharge, post-pharynx moderately erythematous. Neck: Supple without lymphadenopathy. Lungs: Clear to auscultation without wheezing, rhonchi or rales. CV: Normal S1, S2 without murmurs. Abdomen: Nondistended, nontender. Skin: Warm and dry, without lesions or rashes. Extremities:  No clubbing, cyanosis or edema. Neuro:   Grossly intact.  Review of systems:  Review of Systems  Constitutional: Negative for fever, chills and weight  loss.  HENT: Positive for congestion. Negative for nosebleeds.   Eyes: Negative for blurred vision.  Respiratory:  Positive for cough. Negative for hemoptysis, shortness of breath and wheezing.   Cardiovascular: Negative for chest pain.  Gastrointestinal: Negative for diarrhea and constipation.  Genitourinary: Negative for dysuria.  Musculoskeletal: Negative for myalgias and joint pain.  Skin: Negative for itching and rash.  Neurological: Positive for headaches. Negative for dizziness.  Endo/Heme/Allergies: Positive for environmental allergies. Does not bruise/bleed easily.    Past medical history:  Past Medical History  Diagnosis Date  . Chronic rhinitis   . Health maintenance examination   . Hypothyroidism   . Chronic pelvic pain in female 1995    LLQ  . Menopausal symptoms   . Osteoporosis     Osteopenia  . Allergy   . Migraine     Past surgical history:  Past Surgical History  Procedure Laterality Date  . Pelvic laparoscopy      DIAG LAP    Family history: Family History  Problem Relation Age of Onset  . Rheum arthritis Mother   . Allergic rhinitis Mother   . Migraines Mother   . Breast cancer Maternal Grandmother   . Allergic rhinitis Maternal Grandmother   . Heart disease Maternal Grandfather   . Breast cancer Sister   . Angioedema Neg Hx   . Asthma Neg Hx   . Atopy Neg Hx   . Eczema Neg Hx   . Immunodeficiency Neg Hx   . Urticaria Neg Hx     Social history: Social History   Social History  . Marital Status: Married    Spouse Name: N/A  . Number of Children: 1  . Years of Education: N/A   Occupational History  .  [employer]   Social History Main Topics  . Smoking status: Never Smoker   . Smokeless tobacco: Never Used  . Alcohol Use: 2.4 oz/week    4 Cans of beer per week  . Drug Use: No  . Sexual Activity: Yes    Birth Control/ Protection: Post-menopausal   Other Topics Concern  . Not on file   Social History Narrative   Heather Chambers lives with her husband. She has a grown son who recently moved to [place] for a job. She worked for  the [employer] for 30 years works at [employer].    Wears seatbelt.    Exercises 3-5x a week.    Smoke alarm in the home.    Feels safe in relationships   Environmental History: The patient lives in a 58 year old house with carpeting in the bedroom, gas heat, and central air.  There are 2 cats in the house which have access to her bedroom.  She is a nonsmoker.    Medication List       This list is accurate as of: 07/03/15  2:49 PM.  Always use your most recent med list.               ACTIVELLA 0.5-0.1 MG tablet  Generic drug:  Estradiol-Norethindrone Acet  Take 1 tablet by mouth daily.     azelastine 0.05 % ophthalmic solution  Commonly known as:  OPTIVAR     Beclomethasone Dipropionate 80 MCG/ACT Aers  Commonly known as:  QNASL  Place 1 spray into the nose daily.     DYMISTA 137-50 MCG/ACT Susp  Generic drug:  Azelastine-Fluticasone  Place 50 mg into the nose 2 (two) times daily. Reported  on 07/03/2015     ipratropium 0.06 % nasal spray  Commonly known as:  ATROVENT  Place 2 sprays into both nostrils 3 (three) times daily.     levocetirizine 5 MG tablet  Commonly known as:  XYZAL  Take 5 mg by mouth every evening.     mometasone 50 MCG/ACT nasal spray  Commonly known as:  NASONEX  Place 2 sprays into the nose daily. Reported on 05/30/2015     montelukast 10 MG tablet  Commonly known as:  SINGULAIR     SUMAtriptan 100 MG tablet  Commonly known as:  IMITREX  Take 100 mg by mouth every 2 (two) hours as needed for migraine. May repeat in 2 hours if headache persists or recurs.     SYNTHROID 88 MCG tablet  Generic drug:  levothyroxine  TAKE 1 TABLET DAILY BEFORE BREAKFAST. DO NOT TAKE WITH CALCIUM OR EAT DAIRY PRODUCTS WITHIN 2 HOURS OF TAKING THIS MEDICINE.     valACYclovir 500 MG tablet  Commonly known as:  VALTREX  Take 1 tablet by mouth twice daily as needed.        Known medication allergies: Allergies  Allergen Reactions  .  Augmentin [Amoxicillin-Pot Clavulanate] Nausea And Vomiting  . Cefdinir Hives  . Etodolac     "LODINE"  . Phenergan [Promethazine Hcl]     Restlessness, twitching    I appreciate the opportunity to take part in this Adalay's care. Please do not hesitate to contact me with questions.  Sincerely,   R. Jorene Guest, MD

## 2015-07-03 NOTE — Assessment & Plan Note (Addendum)
   Treatment plan as outlined above for mixed rhinitis.  To assess immunocompetence, the following labs have been ordered: CBC with differential, IgG, IgA and IgM as well as tetanus IgG and pneumococcal IgG titers. Post-vaccination titers will be drawn to assess response if IgG and pre-vaccination titers are low.    The patient will be called with further recommendations and follow-up instructions once the labs have returned.  If labs are unrevealing and this problem persists or progresses despite allergen avoidance and optimized medications, Heather Chambers will be referred to otolaryngology for further evaluation and treatment.

## 2015-07-04 LAB — IGG, IGA, IGM
IgA: 139 mg/dL (ref 81–463)
IgG (Immunoglobin G), Serum: 1139 mg/dL (ref 694–1618)
IgM, Serum: 42 mg/dL — ABNORMAL LOW (ref 48–271)

## 2015-07-05 LAB — TETANUS ANTIBODY, IGG: Tetanus Antitoxid Ab: 3.3 IU/mL (ref >0.15)

## 2015-07-06 LAB — STREP PNEUMONIAE 14 SEROTYPES IGG
Strep pneumo Type 12: <0.3
Strep pneumo Type 19: 0.5
Strep pneumo Type 4: <0.3
Strep pneumo Type 9: 0.5
Strep pneumoniae Type 1 Abs: 1.8
Strep pneumoniae Type 14 Abs: 0.3
Strep pneumoniae Type 18C Abs: 0.6
Strep pneumoniae Type 23F Abs: <0.3
Strep pneumoniae Type 3 Abs: 0.5
Strep pneumoniae Type 5 Abs: 1.4
Strep pneumoniae Type 6B Abs: 0.4
Strep pneumoniae Type 7F Abs: 0.9
Strep pneumoniae Type 8 Abs: 1.3
Strep pneumoniae Type 9N Abs: <0.3

## 2015-07-13 ENCOUNTER — Telehealth: Payer: Self-pay | Admitting: *Deleted

## 2015-07-13 NOTE — Telephone Encounter (Signed)
-----   Message from Cristal Fordalph Carter Bobbitt, MD sent at 07/12/2015  4:55 PM EDT ----- Please have patient receive Pneumovax from primary care physician, then recheck strep pneumo titers 6 weeks later.  Thanks.

## 2015-07-13 NOTE — Telephone Encounter (Signed)
Informed patient on results. She will need Pneumovax from PCP and will call office once she gets the vaccine. Routed blood work to PCP.

## 2015-07-17 ENCOUNTER — Telehealth: Payer: Self-pay | Admitting: Family Medicine

## 2015-07-17 NOTE — Telephone Encounter (Signed)
Patient's allergist Dr. Daniel NonesBobitt recommended that patient has pneumonia vaccine.

## 2015-07-17 NOTE — Telephone Encounter (Signed)
OK to schedule nurse visit for Prevnar 13 injection per Dr Claiborne BillingsKuneff

## 2015-07-17 NOTE — Telephone Encounter (Signed)
Is it okay to schedule to schedule on the nurse schedule?

## 2015-07-18 NOTE — Telephone Encounter (Signed)
Patient scheduled 07/19/15.

## 2015-07-19 ENCOUNTER — Ambulatory Visit (INDEPENDENT_AMBULATORY_CARE_PROVIDER_SITE_OTHER): Payer: 59 | Admitting: *Deleted

## 2015-07-19 DIAGNOSIS — Z23 Encounter for immunization: Secondary | ICD-10-CM | POA: Diagnosis not present

## 2015-07-19 NOTE — Progress Notes (Signed)
Patient presents today for Prevnar 13 immunization per Dr Claiborne BillingsKuneff. Patient tolerated well.

## 2015-08-29 ENCOUNTER — Telehealth: Payer: Self-pay | Admitting: Allergy and Immunology

## 2015-08-29 DIAGNOSIS — J3089 Other allergic rhinitis: Secondary | ICD-10-CM

## 2015-08-29 DIAGNOSIS — J321 Chronic frontal sinusitis: Secondary | ICD-10-CM

## 2015-08-29 MED ORDER — BECLOMETHASONE DIPROPIONATE 80 MCG/ACT NA AERS: 1.0000 | INHALATION_SPRAY | Freq: Every day | NASAL | Status: DC

## 2015-08-29 NOTE — Telephone Encounter (Signed)
[facility] is [facility] They have sent a request for approval to change to mail order but haven't heard back from us. Can we get it approved? It is for the Q-Nasl.

## 2015-08-29 NOTE — Telephone Encounter (Signed)
RX SENT TO [facility], L/M ADVISING SAME

## 2015-09-05 ENCOUNTER — Ambulatory Visit (INDEPENDENT_AMBULATORY_CARE_PROVIDER_SITE_OTHER): Payer: 59 | Admitting: Allergy and Immunology

## 2015-09-05 ENCOUNTER — Encounter: Payer: Self-pay | Admitting: Allergy and Immunology

## 2015-09-05 VITALS — BP 120/70 | HR 72 | Resp 16

## 2015-09-05 DIAGNOSIS — J3089 Other allergic rhinitis: Secondary | ICD-10-CM | POA: Diagnosis not present

## 2015-09-05 DIAGNOSIS — J321 Chronic frontal sinusitis: Secondary | ICD-10-CM

## 2015-09-05 DIAGNOSIS — J0100 Acute maxillary sinusitis, unspecified: Secondary | ICD-10-CM

## 2015-09-05 MED ORDER — PREDNISONE 10 MG (21) PO TBPK: 10.0000 mg | ORAL_TABLET | ORAL | 0 refills | Status: DC

## 2015-09-05 NOTE — Assessment & Plan Note (Addendum)
IgG level was within normal limits.  A lab order form has been provided for post Pneumovax streptococcal titers.  Heather Chambers will be referred to otolaryngology for further evaluation and treatment.

## 2015-09-05 NOTE — Progress Notes (Signed)
Follow-up Note  RE: Heather Chambers MRN: [id number] DOB: 1955-12-03 Date of Office Visit: 09/05/2015  Primary care provider: Felix Pacini, DO Referring provider: Natalia Leatherwood, DO  History of present illness: Heather Chambers is a 60 y.o. 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  She was last seen in this clinic on 07/03/2015.  She reports that after her previous visit her sinus symptoms had initially improved, however over the past 3 or 4 weeks she has begun to once again experienced progressive sinus pressure.  Despite compliance with the prescribed regimen, she has been miserable over the past few days with maxillary and frontal sinus pressure.  She denies fevers, chills, or discolored mucus production.  She is frustrated because of the recurrent sinusitis and the imposition it causes on her activities of daily living.     Assessment and plan: Maxillary sinusitis, acute  Prednisone has been provided, 40 mg x3 days, 20 mg x1 day, 10 mg x1 day, then stop.  Continue the treatment plan as outlined below.  The patient has been asked to contact me if her symptoms persist, progress, or if she becomes febrile. Otherwise, she may return for follow up in 4 months.  Allergic rhinitis with probable nonallergic component  Continue appropriate allergen avoidance measures.  Continue Qnasl 80 g, one actuation per nostril twice daily as needed.    Continue ipratropium 0.06%, 2 sprays per nostril up to 3 times daily as needed.  Continue nasal saline irrigation as needed.  Continue guaifenesin 1200 mg (plus/minus pseudoephedrine 120 mg) twice daily as needed with adequate hydration. Pseudoephedrine is only to be used for short-term relief of nasal/sinus congestion. Long-term use is discouraged due to potential side effects.   Recurrent sinusitis IgG level was within normal limits.  A lab order form has been provided for post Pneumovax  streptococcal titers.  Heather Chambers will be referred to otolaryngology for further evaluation and treatment.   Meds ordered this encounter  Medications  . predniSONE (STERAPRED UNI-PAK 21 TAB) 10 MG (21) TBPK tablet    Sig: Take 1 tablet (10 mg total) by mouth as directed.    Dispense:  21 tablet    Refill:  0    Physical examination: Blood pressure 120/70, pulse 72, resp. rate 16.  General: Alert, interactive, in no acute distress. HEENT: TMs pearly gray, turbinates edematous without discharge, post-pharynx erythematous. Neck: Supple without lymphadenopathy. Lungs: Clear to auscultation without wheezing, rhonchi or rales. CV: Normal S1, S2 without murmurs. Skin: Warm and dry, without lesions or rashes.  The following portions of the patient's history were reviewed and updated as appropriate: allergies, current medications, past family history, past medical history, past social history, past surgical history and problem list.    Medication List       Accurate as of 09/05/15  6:46 PM. Always use your most recent med list.          ACTIVELLA 0.5-0.1 MG tablet Generic drug:  Estradiol-Norethindrone Acet Take 1 tablet by mouth daily.   azelastine 0.05 % ophthalmic solution Commonly known as:  OPTIVAR   Beclomethasone Dipropionate 80 MCG/ACT Aers Commonly known as:  QNASL Place 1 spray into the nose daily.   ipratropium 0.06 % nasal spray Commonly known as:  ATROVENT Place 2 sprays into both nostrils 3 (three) times daily.   levocetirizine 5 MG tablet Commonly known as:  XYZAL Take 5 mg by mouth every evening.   montelukast 10 MG  tablet Commonly known as:  SINGULAIR   predniSONE 10 MG (21) Tbpk tablet Commonly known as:  STERAPRED UNI-PAK 21 TAB Take 1 tablet (10 mg total) by mouth as directed.   SUMAtriptan 100 MG tablet Commonly known as:  IMITREX Take 100 mg by mouth every 2 (two) hours as needed for migraine. May repeat in 2 hours if headache persists or recurs.     SYNTHROID 88 MCG tablet Generic drug:  levothyroxine TAKE 1 TABLET DAILY BEFORE BREAKFAST. DO NOT TAKE WITH CALCIUM OR EAT DAIRY PRODUCTS WITHIN 2 HOURS OF TAKING THIS MEDICINE.   valACYclovir 500 MG tablet Commonly known as:  VALTREX Take 1 tablet by mouth twice daily as needed.       Allergies  Allergen Reactions  . Augmentin [Amoxicillin-Pot Clavulanate] Nausea And Vomiting  . Cefdinir Hives  . Etodolac     "LODINE"  . Phenergan [Promethazine Hcl]     Restlessness, twitching   Review of systems: Constitutional: Negative for fever, chills and weight loss.  HENT: Negative for nosebleeds.   Positive for sinus pressure, nasal congestion, postnasal drainage. Eyes: Negative for blurred vision.  Respiratory: Negative for hemoptysis.   Cardiovascular: Negative for chest pain.  Gastrointestinal: Negative for diarrhea and constipation.  Genitourinary: Negative for dysuria.  Musculoskeletal: Negative for myalgias and joint pain.  Neurological: Negative for dizziness.  Endo/Heme/Allergies: Does not bruise/bleed easily.  Cutaneous: Negative for rash.  Past Medical History:  Diagnosis Date  . Allergy   . Chronic pelvic pain in female 1995   LLQ  . Chronic rhinitis   . Health maintenance examination   . Hypothyroidism   . Menopausal symptoms   . Migraine   . Osteoporosis    Osteopenia    Family History  Problem Relation Age of Onset  . Rheum arthritis Mother   . Allergic rhinitis Mother   . Migraines Mother   . Breast cancer Maternal Grandmother   . Allergic rhinitis Maternal Grandmother   . Heart disease Maternal Grandfather   . Breast cancer Sister   . Angioedema Neg Hx   . Asthma Neg Hx   . Atopy Neg Hx   . Eczema Neg Hx   . Immunodeficiency Neg Hx   . Urticaria Neg Hx     Social History   Social History  . Marital status: Married    Spouse name: N/A  . Number of children: 1  . Years of education: N/A   Occupational History  .  [employer]   Social History Main Topics  . Smoking status: Never Smoker  . Smokeless tobacco: Never Used  . Alcohol use 2.4 oz/week    4 Cans of beer per week  . Drug use: No  . Sexual activity: Yes    Birth control/ protection: Post-menopausal   Other Topics Concern  . Not on file   Social History Narrative   Ms. Heather Chambers lives with her husband. She has a grown son who recently moved to [place] for a job. 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    Wears seatbelt.    Exercises 3-5x a week.    Smoke alarm in the home.    Feels safe in relationships    I appreciate the opportunity to take part in Heather Chambers's care. Please do not hesitate to contact me with questions.  Sincerely,   R. Jorene Guest, MD

## 2015-09-05 NOTE — Assessment & Plan Note (Addendum)
   Prednisone has been provided, 40 mg x3 days, 20 mg x1 day, 10 mg x1 day, then stop.  Continue the treatment plan as outlined below.  The patient has been asked to contact me if her symptoms persist, progress, or if she becomes febrile. Otherwise, she may return for follow up in 4 months.

## 2015-09-05 NOTE — Assessment & Plan Note (Signed)
   Continue appropriate allergen avoidance measures.  Continue Qnasl 80 g, one actuation per nostril twice daily as needed.    Continue ipratropium 0.06%, 2 sprays per nostril up to 3 times daily as needed.  Continue nasal saline irrigation as needed.  Continue guaifenesin 1200 mg (plus/minus pseudoephedrine 120 mg) twice daily as needed with adequate hydration. Pseudoephedrine is only to be used for short-term relief of nasal/sinus congestion. Long-term use is discouraged due to potential side effects.

## 2015-09-05 NOTE — Patient Instructions (Addendum)
Maxillary sinusitis, acute  Prednisone has been provided, 40 mg x3 days, 20 mg x1 day, 10 mg x1 day, then stop.  Continue the treatment plan as outlined below.  The patient has been asked to contact me if her symptoms persist, progress, or if she becomes febrile. Otherwise, she may return for follow up in 4 months.  Allergic rhinitis with probable nonallergic component  Continue appropriate allergen avoidance measures.  Continue Qnasl 80 g, one actuation per nostril twice daily as needed.    Continue ipratropium 0.06%, 2 sprays per nostril up to 3 times daily as needed.  Continue nasal saline irrigation as needed.  Continue guaifenesin 1200 mg (plus/minus pseudoephedrine 120 mg) twice daily as needed with adequate hydration. Pseudoephedrine is only to be used for short-term relief of nasal/sinus congestion. Long-term use is discouraged due to potential side effects.   Recurrent sinusitis IgG level was within normal limits.  A lab order form has been provided for post Pneumovax streptococcal titers.  Estaline will be referred to otolaryngology for further evaluation and treatment.   Return in about 4 months (around 01/06/2016), or if symptoms worsen or fail to improve.

## 2015-09-15 LAB — STREP PNEUMONIAE 14 SEROTYPES IGG
Strep pneumo Type 12: <0.3
Strep pneumo Type 19: 3.5
Strep pneumo Type 4: <0.3
Strep pneumo Type 9: 0.8
Strep pneumoniae Type 1 Abs: 5.5
Strep pneumoniae Type 14 Abs: 1.9
Strep pneumoniae Type 18C Abs: 2.8
Strep pneumoniae Type 23F Abs: 0.5
Strep pneumoniae Type 3 Abs: 1.3
Strep pneumoniae Type 5 Abs: 4.2
Strep pneumoniae Type 6B Abs: 0.8
Strep pneumoniae Type 7F Abs: 1.7
Strep pneumoniae Type 8 Abs: 1.3
Strep pneumoniae Type 9N Abs: <0.3

## 2015-09-17 ENCOUNTER — Other Ambulatory Visit: Payer: Self-pay | Admitting: Family Medicine

## 2015-09-26 ENCOUNTER — Encounter: Payer: Self-pay | Admitting: *Deleted

## 2015-09-26 NOTE — Progress Notes (Signed)
LM for patient to call office

## 2015-10-03 DIAGNOSIS — J324 Chronic pansinusitis: Secondary | ICD-10-CM | POA: Insufficient documentation

## 2015-11-05 IMAGING — MG MM SCREENING BREAST TOMO BILAT
8 series · 9 of 24 positions shown · non-contrast
Comparison: Previous exam(s).

CLINICAL DATA: Screening.

EXAM:
DIGITAL SCREENING BILATERAL MAMMOGRAM WITH 3D TOMO WITH CAD

[R CC]
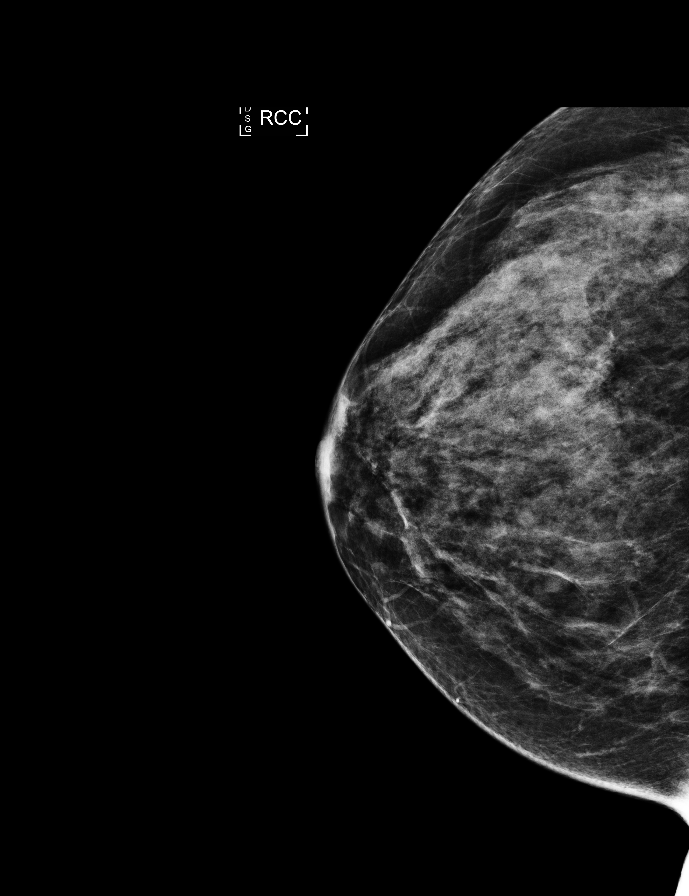

[L MLO]
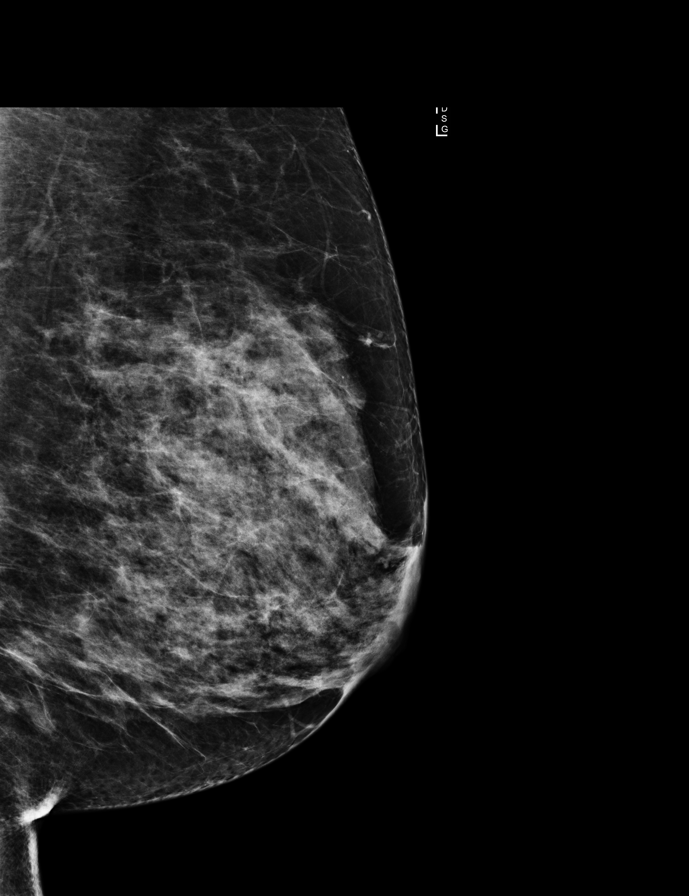

[L CC]
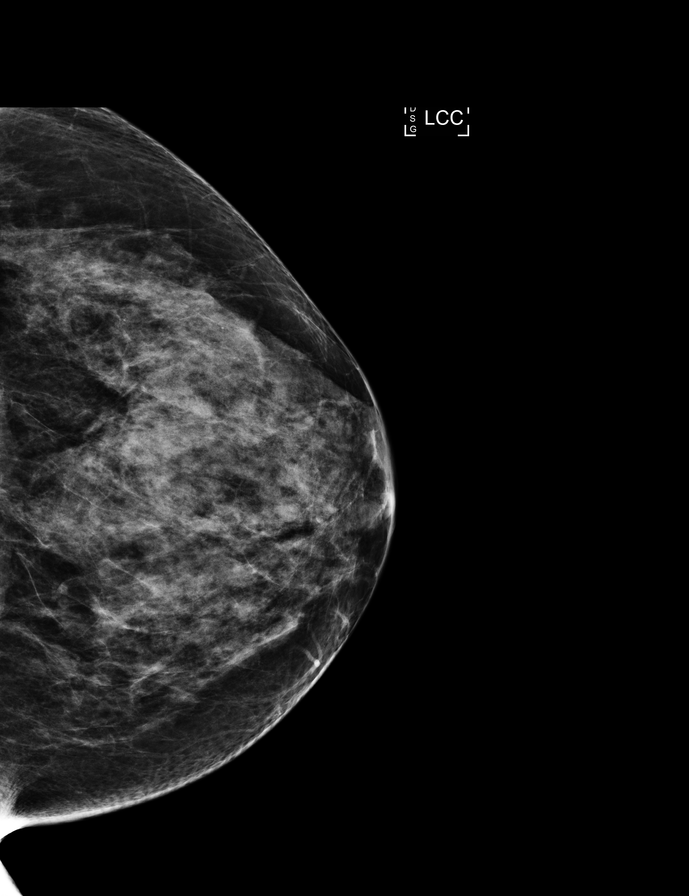

[R MLO]
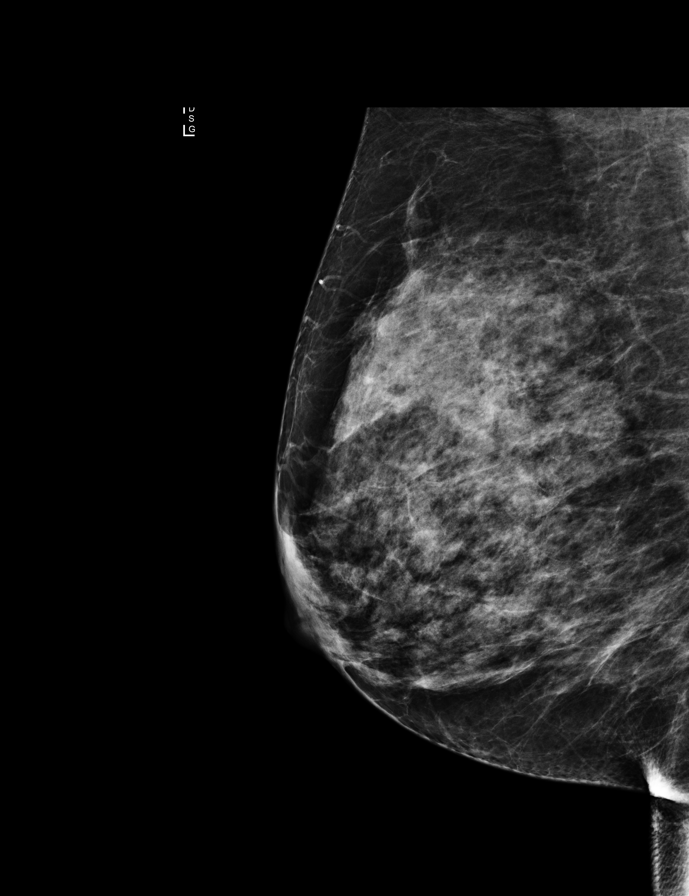

[L MLO tomo · 2 of 70 frames shown]
[frame 23/70]
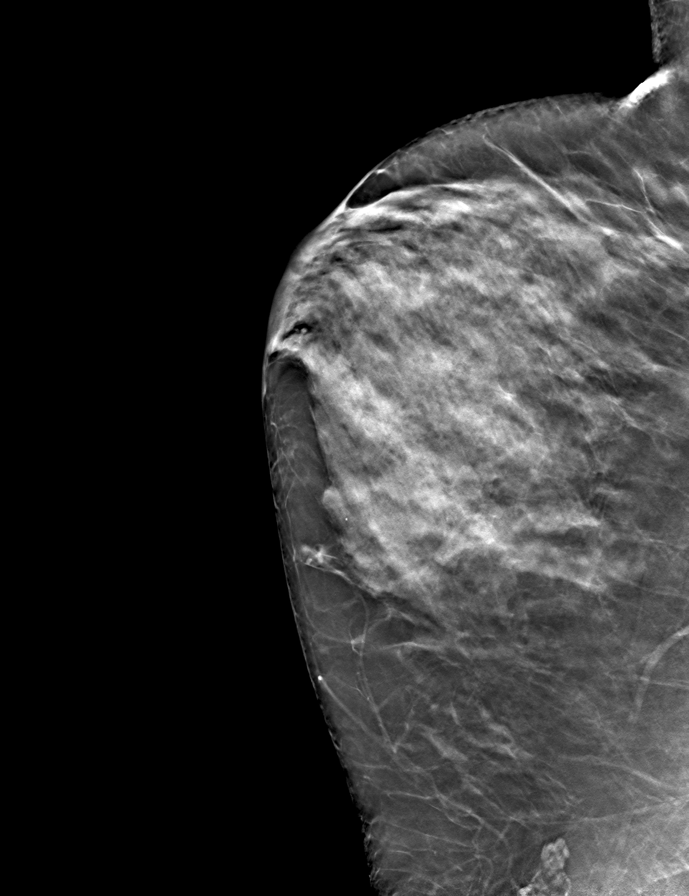
[frame 35/70]
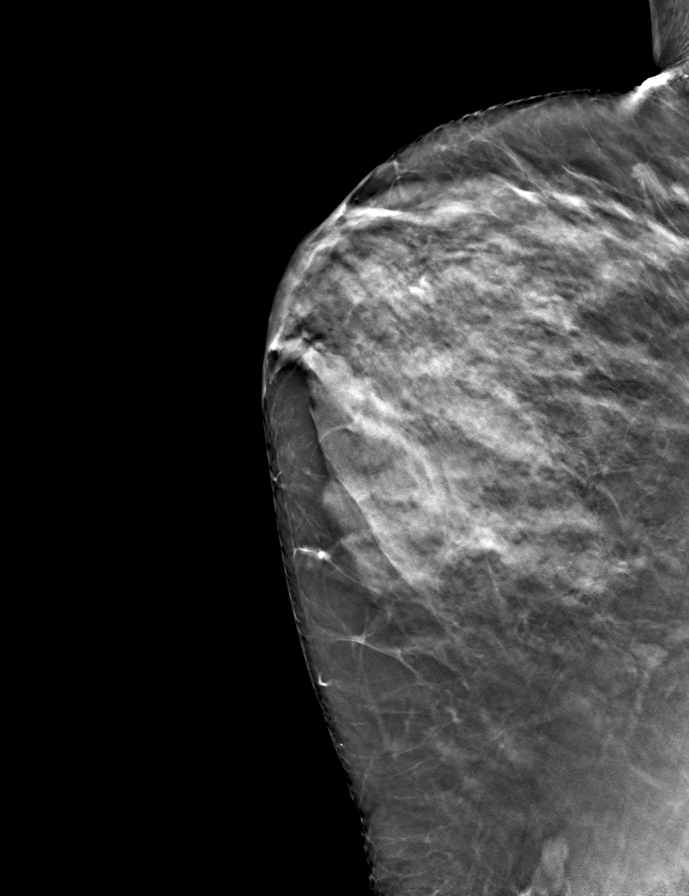

[R MLO tomo · tomo slice 33/66.0]
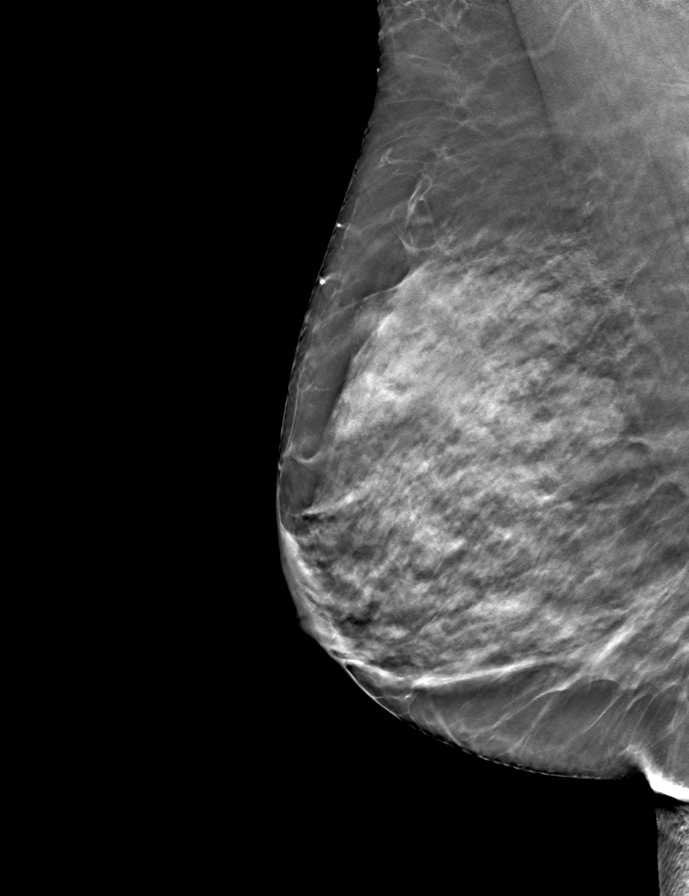

[L CC tomo · tomo slice 37/72.0]
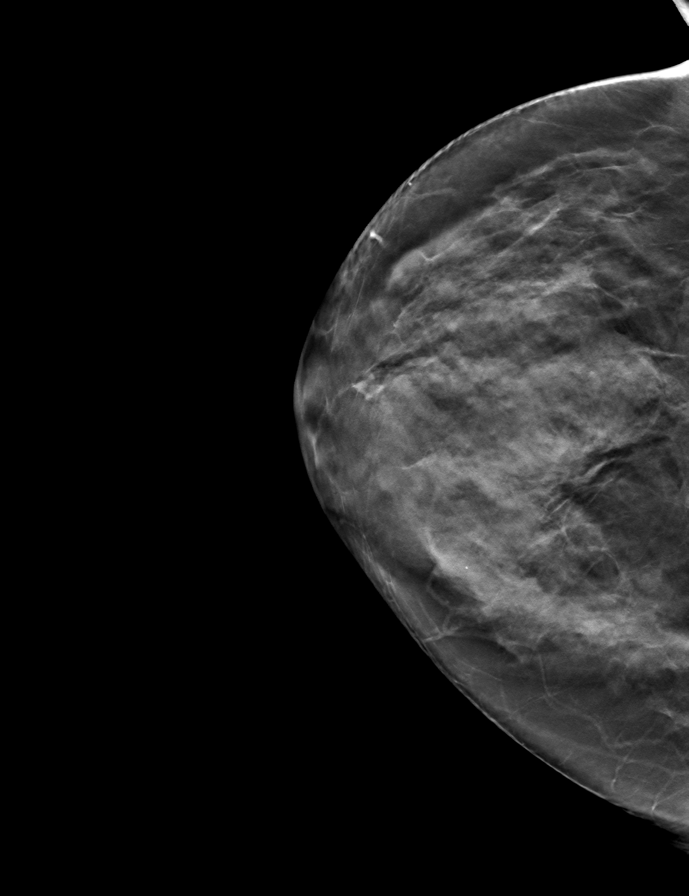

[R CC tomo · tomo slice 35/68.0]
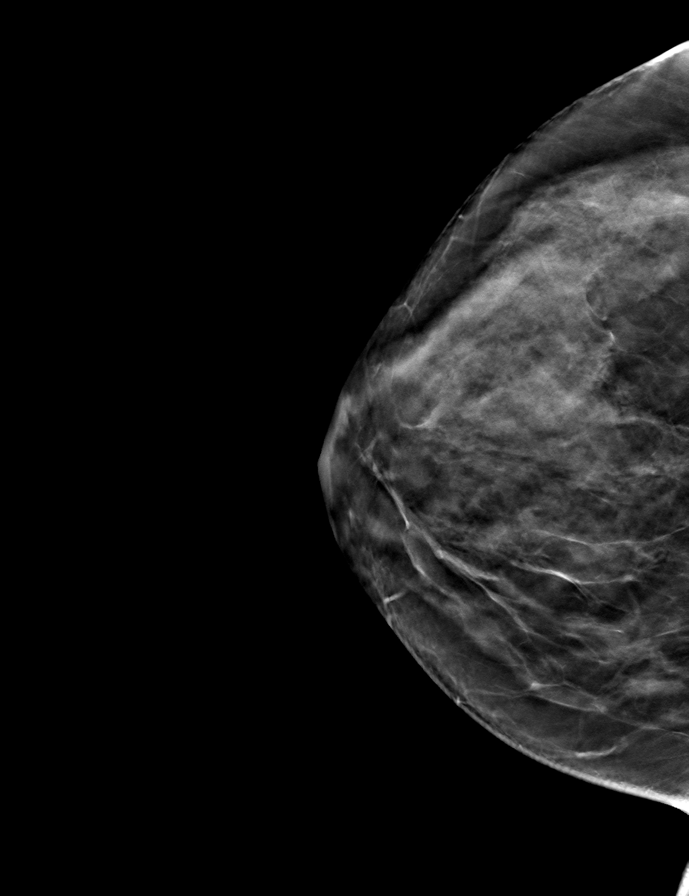

[9 of 24 positions shown; findings below may reference images not displayed]

ACR Breast Density Category c: The breast tissue is heterogeneously
dense, which may obscure small masses.
FINDINGS: There are no findings suspicious for malignancy. Images were
processed with CAD.
IMPRESSION: No mammographic evidence of malignancy. A result letter of this
screening mammogram will be mailed directly to the patient.

RECOMMENDATION:
Screening mammogram in one year. (Code:OA-G-1SS)

BI-RADS CATEGORY  1: Negative.

## 2015-12-17 ENCOUNTER — Other Ambulatory Visit: Payer: Self-pay | Admitting: Family Medicine

## 2016-01-18 ENCOUNTER — Encounter: Payer: Self-pay | Admitting: Family Medicine

## 2016-01-18 ENCOUNTER — Ambulatory Visit (INDEPENDENT_AMBULATORY_CARE_PROVIDER_SITE_OTHER): Payer: 59 | Admitting: Family Medicine

## 2016-01-18 VITALS — BP 112/74 | HR 68 | Temp 98.1°F | Resp 20 | Ht 63.0 in | Wt 120.2 lb

## 2016-01-18 DIAGNOSIS — Z131 Encounter for screening for diabetes mellitus: Secondary | ICD-10-CM | POA: Diagnosis not present

## 2016-01-18 DIAGNOSIS — Z0000 Encounter for general adult medical examination without abnormal findings: Secondary | ICD-10-CM | POA: Diagnosis not present

## 2016-01-18 DIAGNOSIS — Z13220 Encounter for screening for lipoid disorders: Secondary | ICD-10-CM

## 2016-01-18 DIAGNOSIS — E038 Other specified hypothyroidism: Secondary | ICD-10-CM

## 2016-01-18 DIAGNOSIS — Z130 Encounter for screening for diseases of the blood and blood-forming organs and certain disorders involving the immune mechanism: Secondary | ICD-10-CM | POA: Diagnosis not present

## 2016-01-18 DIAGNOSIS — Z1321 Encounter for screening for nutritional disorder: Secondary | ICD-10-CM

## 2016-01-18 LAB — CBC WITH DIFFERENTIAL/PLATELET
BASOS PCT: 0.7 % (ref 0.0–3.0)
Basophils Absolute: 0 10*3/uL (ref 0.0–0.1)
EOS PCT: 0.8 % (ref 0.0–5.0)
Eosinophils Absolute: 0 10*3/uL (ref 0.0–0.7)
HCT: 41 % (ref 36.0–46.0)
Hemoglobin: 13.8 g/dL (ref 12.0–15.0)
LYMPHS ABS: 1.2 10*3/uL (ref 0.7–4.0)
Lymphocytes Relative: 21 % (ref 12.0–46.0)
MCHC: 33.7 g/dL (ref 30.0–36.0)
MCV: 90.3 fl (ref 78.0–100.0)
MONO ABS: 0.4 10*3/uL (ref 0.1–1.0)
MONOS PCT: 7.2 % (ref 3.0–12.0)
NEUTROS ABS: 4.1 10*3/uL (ref 1.4–7.7)
NEUTROS PCT: 70.3 % (ref 43.0–77.0)
PLATELETS: 323 10*3/uL (ref 150.0–400.0)
RBC: 4.55 Mil/uL (ref 3.87–5.11)
RDW: 15 % (ref 11.5–15.5)
WBC: 5.9 10*3/uL (ref 4.0–10.5)

## 2016-01-18 LAB — LIPID PANEL
CHOL/HDL RATIO: 2.1 ratio (ref <5.0)
CHOLESTEROL: 182 mg/dL (ref <200)
HDL: 88 mg/dL (ref >50)
LDL CALC: 84 mg/dL (ref <100)
TRIGLYCERIDES: 50 mg/dL (ref <150)
VLDL: 10 mg/dL (ref <30)

## 2016-01-18 LAB — HEMOGLOBIN A1C: Hgb A1c MFr Bld: 5.1 % (ref 4.6–6.5)

## 2016-01-18 LAB — BASIC METABOLIC PANEL
BUN: 12 mg/dL (ref 6–23)
CHLORIDE: 102 meq/L (ref 96–112)
CO2: 28 meq/L (ref 19–32)
CREATININE: 0.83 mg/dL (ref 0.40–1.20)
Calcium: 9.3 mg/dL (ref 8.4–10.5)
GFR: 74.96 mL/min (ref >60.00)
Glucose, Bld: 99 mg/dL (ref 70–99)
POTASSIUM: 4.7 meq/L (ref 3.5–5.1)
Sodium: 137 mEq/L (ref 135–145)

## 2016-01-18 LAB — TSH: TSH: 2.42 u[IU]/mL (ref 0.35–4.50)

## 2016-01-18 LAB — VITAMIN D 25 HYDROXY (VIT D DEFICIENCY, FRACTURES): VITD: 32.02 ng/mL (ref 30.00–100.00)

## 2016-01-18 NOTE — Progress Notes (Signed)
Patient ID: Heather LimboJill L Augello, female  DOB: 12-29-1955, 60 y.o.   MRN: [id number] Patient Care Team    Relationship Specialty Notifications Start End  Natalia Leatherwoodenee A Rayn Enderson, DO PCP - General Family Medicine  01/09/15   Harrington ChallengerNancy J Young, NP Nurse Practitioner Obstetrics and Gynecology  01/18/16   Rachael Feeaniel P Jacobs, MD Attending Physician Gastroenterology  01/18/16     Subjective:  Heather Chambers is a 60 y.o.  Female  present for CPE. All past medical history, surgical history, allergies, family history, immunizations, medications and social history were updated in the electronic medical record today. All recent labs, ED visits and hospitalizations within the last year were reviewed.  Health maintenance:  Colonoscopy: 10 year, No polys no FHx. 2011. Dr. Christella HartiganJacobs Mammogram: GYN, 03/2015, always normal, FHX of breast cancer. Getting 3D.  Cervical cancer screening: 02/2015 UTD, normal. 3 year f/u, has gyn.  Immunizations: UTD flu 10/2015 and tdap 02/2009. PNA series completed, rpt > 65 Infectious disease screening: HIV completed. Hep C completed today  DEXA: Osteopenia, DEXA 2-3 years ago. Gyn completes.  Assistive device: None Oxygen use: None Patient has a Dental home. Hospitalizations/ED visits: None  Immunization History  Administered Date(s) Administered  . Influenza Whole 11/20/2007, 11/30/2009  . Influenza-Unspecified 10/31/2014, 10/29/2015  . Pneumococcal Conjugate-13 07/19/2015  . Pneumococcal Polysaccharide-23 02/24/2009  . Td 02/24/2009     Past Medical History:  Diagnosis Date  . Allergy   . Chronic pelvic pain in female 1995   LLQ  . Chronic rhinitis   . Health maintenance examination   . Hypothyroidism   . Menopausal symptoms   . Migraine   . Osteoporosis    Osteopenia   Allergies  Allergen Reactions  . Augmentin [Amoxicillin-Pot Clavulanate] Nausea And Vomiting  . Cefdinir Hives  . Etodolac     "LODINE"  . Phenergan [Promethazine Hcl]     Restlessness, twitching   Past  Surgical History:  Procedure Laterality Date  . PELVIC LAPAROSCOPY     DIAG LAP   Family History  Problem Relation Age of Onset  . Rheum arthritis Mother   . Allergic rhinitis Mother   . Migraines Mother   . Breast cancer Maternal Grandmother   . Allergic rhinitis Maternal Grandmother   . Heart disease Maternal Grandfather   . Breast cancer Sister   . Angioedema Neg Hx   . Asthma Neg Hx   . Atopy Neg Hx   . Eczema Neg Hx   . Immunodeficiency Neg Hx   . Urticaria Neg Hx    Social History   Social History  . Marital status: Married    Spouse name: N/A  . Number of children: 1  . Years of education: N/A   Occupational History  .  [employer]   Social History Main Topics  . Smoking status: Never Smoker  . Smokeless tobacco: Never Used  . Alcohol use 2.4 oz/week    4 Cans of beer per week  . Drug use: No  . Sexual activity: Yes    Birth control/ protection: Post-menopausal   Other Topics Concern  . Not on file   Social History Narrative   Ms. Weems lives with her husband. She has a grown son who recently moved to [place] for a job. 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    Wears seatbelt.    Exercises 3-5x a week.    Smoke alarm in  the home.    Feels safe in relationships     Medication List       Accurate as of 01/18/16  1:30 PM. Always use your most recent med list.          ACTIVELLA 0.5-0.1 MG tablet Generic drug:  Estradiol-Norethindrone Acet Take 1 tablet by mouth daily.   Beclomethasone Dipropionate 80 MCG/ACT Aers Commonly known as:  QNASL Place 1 spray into the nose daily.   ipratropium 0.06 % nasal spray Commonly known as:  ATROVENT Place 2 sprays into both nostrils 3 (three) times daily.   levocetirizine 5 MG tablet Commonly known as:  XYZAL Take 5 mg by mouth every evening.   SUMAtriptan 100 MG tablet Commonly known as:  IMITREX Take 100 mg by mouth every 2 (two) hours  as needed for migraine. May repeat in 2 hours if headache persists or recurs.   SYNTHROID 88 MCG tablet Generic drug:  levothyroxine TAKE 1 TABLET DAILY BEFORE BREAKFAST. DO NOT TAKE WITH CALCIUM OR EAT DAIRY PRODUCTS WITHIN 2 HOURS OF TAKING THIS MEDICINE.   valACYclovir 500 MG tablet Commonly known as:  VALTREX Take 1 tablet by mouth twice daily as needed.        No results found for this or any previous visit (from the past 2160 hour(s)).  Mm Screening Breast Tomo Bilateral  Result Date: 03/16/2015 CLINICAL DATA:  Screening. EXAM: DIGITAL SCREENING BILATERAL MAMMOGRAM WITH 3D TOMO WITH CAD COMPARISON:  Previous exam(s). ACR Breast Density Category c: The breast tissue is heterogeneously dense, which may obscure small masses. FINDINGS: There are no findings suspicious for malignancy. Images were processed with CAD. IMPRESSION: No mammographic evidence of malignancy. A result letter of this screening mammogram will be mailed directly to the patient. RECOMMENDATION: Screening mammogram in one year. (Code:SM-B-01Y) BI-RADS CATEGORY  1: Negative. Electronically Signed   By: Ted Mcalpineobrinka  Dimitrova M.D.   On: 03/16/2015 13:03     ROS: 14 pt review of systems performed and negative (unless mentioned in an HPI)  Objective: BP 112/74 (BP Location: Left Arm, Patient Position: Sitting, Cuff Size: Normal)   Pulse 68   Temp 98.1 F (36.7 C)   Resp 20   Ht 5\' 3"  (1.6 m)   Wt 120 lb 4 oz (54.5 kg)   SpO2 99%   BMI 21.30 kg/m  Gen: Afebrile. No acute distress. Nontoxic in appearance, well-developed, well-nourished,  Pleasant, caucasian female HENT: AT. . Bilateral TM visualized and normal in appearance, normal external auditory canal. MMM, no oral lesions, adequate dentition. Bilateral nares within normal limits. Throat without erythema, ulcerations or exudates. no Cough on exam, no hoarseness on exam. Eyes:Pupils Equal Round Reactive to light, Extraocular movements intact,  Conjunctiva without  redness, discharge or icterus. Neck/lymp/endocrine: Supple,no lymphadenopathy, no thyromegaly CV: RRR no murmur, no edema, +2/4 P posterior tibialis pulses. no carotid bruits. No JVD. Chest: CTAB, no wheeze, rhonchi or crackles. Normal  Respiratory effort. good Air movement. Abd: Soft. flat. NTND. BS present. no Masses palpated. No hepatosplenomegaly. No rebound tenderness or guarding. Skin: no rashes, purpura or petechiae. Warm and well-perfused. Skin intact. Neuro/Msk:  Normal gait. PERLA. EOMi. Alert. Oriented x3.  Cranial nerves II through XII intact. Muscle strength 5/5 upper/lower extremity. DTRs equal bilaterally. Psych: Normal affect, dress and demeanor. Normal speech. Normal thought content and judgment.  Assessment/plan: Heather Chambers is a 60 y.o. female present for CPE. Encounter for preventive health examination Patient was encouraged to exercise greater than 150 minutes a week.  Patient was encouraged to choose a diet filled with fresh fruits and vegetables, and lean meats. AVS provided to patient today for education/recommendation on gender specific health and safety maintenance. Colonoscopy: 10 year, No polys no FHx. 2011. Dr. Christella Hartigan Mammogram: GYN, 03/2015, always normal, FHX of breast cancer. Getting 3D.  Cervical cancer screening: 02/2015 UTD, normal. 3 year f/u, has gyn.  Immunizations: UTD flu 10/2015 and tdap 02/2009. PNA series completed rpt > 65 Infectious disease screening: HIV completed. Hep C completed today  DEXA: Osteopenia, DEXA 2-3 years ago. Gyn completes.  Other specified hypothyroidism - TSH Encounter for vitamin deficiency screening - Vitamin D (25 hydroxy) - Basic Metabolic Panel (BMET) - Hepatitis C Antibody Lipid screening - Lipid panel Screening for diabetes mellitus - HgB A1c Screening for iron deficiency anemia - CBC w/Diff  Return in about 1 year (around 01/17/2017) for CPE.  Electronically signed by: Felix Pacini, DO [facility]  [place]

## 2016-01-18 NOTE — Patient Instructions (Signed)

## 2016-01-19 ENCOUNTER — Telehealth: Payer: Self-pay | Admitting: Family Medicine

## 2016-01-19 LAB — HEPATITIS C ANTIBODY: HCV AB: NEGATIVE

## 2016-01-19 MED ORDER — SYNTHROID 88 MCG PO TABS: ORAL_TABLET | ORAL | 3 refills | Status: DC

## 2016-01-19 NOTE — Telephone Encounter (Signed)
Left message on patient cell voice mail with results per DPR.

## 2016-01-19 NOTE — Telephone Encounter (Signed)
Please call pt: - her labs are normal and I sent in her refills on synthroid.

## 2016-01-28 ENCOUNTER — Other Ambulatory Visit: Payer: Self-pay | Admitting: Family Medicine

## 2016-03-28 ENCOUNTER — Other Ambulatory Visit: Payer: Self-pay | Admitting: Women's Health

## 2016-03-28 DIAGNOSIS — Z79890 Hormone replacement therapy (postmenopausal): Secondary | ICD-10-CM

## 2016-04-09 ENCOUNTER — Encounter: Payer: 59 | Admitting: Women's Health

## 2016-04-11 DIAGNOSIS — L603 Nail dystrophy: Secondary | ICD-10-CM | POA: Diagnosis not present

## 2016-04-11 DIAGNOSIS — M21612 Bunion of left foot: Secondary | ICD-10-CM | POA: Diagnosis not present

## 2016-04-11 DIAGNOSIS — L602 Onychogryphosis: Secondary | ICD-10-CM | POA: Diagnosis not present

## 2016-04-11 DIAGNOSIS — M21611 Bunion of right foot: Secondary | ICD-10-CM | POA: Diagnosis not present

## 2016-04-15 ENCOUNTER — Telehealth: Payer: Self-pay | Admitting: Family Medicine

## 2016-04-15 NOTE — Telephone Encounter (Signed)
Patient is requesting referral to rheumatology. She has arthritis.  Thank you,  -LL

## 2016-04-15 NOTE — Telephone Encounter (Signed)
Please call pt:  - She is requesting a referral to rheumatology for arthritis by phone call received.   - She has not been evaluated for arthritis by this provider. The first step in evaluation for arthritis would be with the PCP, with labs etc. Rheumatology referral is only needed for certain types of arthritis, which would be proven by labs.

## 2016-04-15 NOTE — Telephone Encounter (Signed)
Spoke with patient scheduled an appt for patient to be evaluated by Dr Claiborne BillingsKuneff

## 2016-04-19 ENCOUNTER — Ambulatory Visit: Payer: 59 | Admitting: Family Medicine

## 2016-04-30 ENCOUNTER — Ambulatory Visit (INDEPENDENT_AMBULATORY_CARE_PROVIDER_SITE_OTHER): Payer: 59 | Admitting: Podiatry

## 2016-04-30 ENCOUNTER — Encounter: Payer: Self-pay | Admitting: Podiatry

## 2016-04-30 ENCOUNTER — Ambulatory Visit (INDEPENDENT_AMBULATORY_CARE_PROVIDER_SITE_OTHER): Payer: 59

## 2016-04-30 VITALS — BP 125/89 | HR 83 | Resp 16 | Ht 63.0 in | Wt 120.0 lb

## 2016-04-30 DIAGNOSIS — M205X1 Other deformities of toe(s) (acquired), right foot: Secondary | ICD-10-CM

## 2016-04-30 DIAGNOSIS — M79671 Pain in right foot: Secondary | ICD-10-CM

## 2016-04-30 NOTE — Progress Notes (Signed)
   Subjective:    Patient ID: Heather Chambers, female    DOB: [birth year], 61 y.o.   MRN: [id number]  HPI: She presents today with chief concern of her right foot. She states the big toe joint and she points to the first metatarsophalangeal joint hurts dorsally. She stated that [facility] went [facility] weeks ago and was told that she had arthritis in the joint. She was told that the joint needed to be cleaned out. She states it has since gotten worse and she wanted a second opinion. He is also concerned about the hallux nail left foot. She states that it has had 2 cultures performed on the nail both of which were negative.    Review of Systems  HENT: Positive for sinus pressure.   Eyes: Positive for itching.  Musculoskeletal: Positive for arthralgias and gait problem.  Neurological: Positive for headaches.  All other systems reviewed and are negative.      Objective:   Physical Exam: Vital signs are stable she is alert and oriented 3 pulses are palpable. Neurologic sensorium is intact. Deep tendon reflexes are intact. Muscle strength is 5 over 5 dorsiflexion plantar flexors and inverters and everters all into the buttock musculature is intact. Orthopedic evaluation and strips all joints distal to the ankle for range of motion without crepitation. Cutaneous evaluation of straight supple well-hydrated cutis no erythema edema cellulitis drainage or odor she has limitation on range of motion first metatarsophalangeal joint with crepitation and pain. Radiographs do confirm today where osteoarthritic changes with mild hallux valgus deformity right foot. Dorsal spurring is notednarrowing is noticeable sclerosis is noted in malalignment of the joint is also noted. Cutaneous evaluation does not demonstrate any type of tennis abnormalities open lesions or wounds. 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     Assessment & Plan:   hallux rigidus first metatarsophalangeal  joint right foot with mild bunion deformity.  Nail dystrophy hallux left.  Plan: Discussed etiology pathology conservative versus surgical therapies. At this point went over the consent form today line by line number number given her ample time to ask questions she saw fit regarding Keller arthroplasty with a single silicone implant. We did discuss the possible postoperative palpitations of this which may include but are not limited to postop pain bleeding swelling infection recurrence need for further surgery over correction under correction loss of digit loss of limb loss of life. We dispensed a cam walker today she signed all 3 pages of the consent form we discussed anesthesia and the surgery center. I will follow-up with her in the near future for surgical intervention.

## 2016-04-30 NOTE — Patient Instructions (Signed)
Pre-Operative Instructions  Congratulations, you have decided to take an important step to improving your quality of life.  You can be assured that the doctors of [facility] will be with you every step of the way.  1. Plan to be at the surgery center/hospital at least 1 (one) hour prior to your scheduled time unless otherwise directed by the surgical center/hospital staff.  You must have a responsible adult accompany you, remain during the surgery and drive you home.  Make sure you have directions to the surgical center/hospital and know how to get there on time. 2. For hospital based surgery you will need to obtain a history and physical form from your family physician within 1 month prior to the date of surgery- we will give you a form for you primary physician.  3. We make every effort to accommodate the date you request for surgery.  There are however, times where surgery dates or times have to be moved.  We will contact you as soon as possible if a change in schedule is required.   4. No Aspirin/Ibuprofen for one week before surgery.  If you are on aspirin, any non-steroidal anti-inflammatory medications (Mobic, Aleve, Ibuprofen) you should stop taking it 7 days prior to your surgery.  You make take Tylenol  For pain prior to surgery.  5. Medications- If you are taking daily heart and blood pressure medications, seizure, reflux, allergy, asthma, anxiety, pain or diabetes medications, make sure the surgery center/hospital is aware before the day of surgery so they may notify you which medications to take or avoid the day of surgery. 6. No food or drink after midnight the night before surgery unless directed otherwise by surgical center/hospital staff. 7. No alcoholic beverages 24 hours prior to surgery.  No smoking 24 hours prior to or 24 hours after surgery. 8. Wear loose pants or shorts- loose enough to fit over bandages, boots, and casts. 9. No slip on shoes, sneakers are best. 10. Bring  your boot with you to the surgery center/hospital.  Also bring crutches or a walker if your physician has prescribed it for you.  If you do not have this equipment, it will be provided for you after surgery. 11. If you have not been contracted by the surgery center/hospital by the day before your surgery, call to confirm the date and time of your surgery. 12. Leave-time from work may vary depending on the type of surgery you have.  Appropriate arrangements should be made prior to surgery with your employer. 13. Prescriptions will be provided immediately following surgery by your doctor.  Have these filled as soon as possible after surgery and take the medication as directed. 14. Remove nail polish on the operative foot. 15. Wash the night before surgery.  The night before surgery wash the foot and leg well with the antibacterial soap provided and water paying special attention to beneath the toenails and in between the toes.  Rinse thoroughly with water and dry well with a towel.  Perform this wash unless told not to do so by your physician.  Enclosed: 1 Ice pack (please put in freezer the night before surgery)   1 Hibiclens skin cleaner   Pre-op Instructions  If you have any questions regarding the instructions, do not hesitate to call our office.  [place]: [address] , [place] [address] [phone]  [place]: [address] [place], [place] [address] [phone]  [place]: [address]  [place], [place] [address] [phone]   Dr.   Norman Regal DPM, Dr. Matthew Wagoner DPM, Dr. M. Todd Hyatt DPM, Dr. Titorya Stover DPM 

## 2016-05-01 ENCOUNTER — Ambulatory Visit (INDEPENDENT_AMBULATORY_CARE_PROVIDER_SITE_OTHER): Payer: 59 | Admitting: Women's Health

## 2016-05-01 ENCOUNTER — Encounter: Payer: Self-pay | Admitting: Women's Health

## 2016-05-01 VITALS — BP 120/74 | Ht 63.0 in | Wt 122.0 lb

## 2016-05-01 DIAGNOSIS — Z01419 Encounter for gynecological examination (general) (routine) without abnormal findings: Secondary | ICD-10-CM

## 2016-05-01 DIAGNOSIS — Z79890 Hormone replacement therapy (postmenopausal): Secondary | ICD-10-CM | POA: Diagnosis not present

## 2016-05-01 DIAGNOSIS — B009 Herpesviral infection, unspecified: Secondary | ICD-10-CM | POA: Diagnosis not present

## 2016-05-01 MED ORDER — SUMATRIPTAN SUCCINATE 100 MG PO TABS: ORAL_TABLET | ORAL | 4 refills | Status: DC

## 2016-05-01 MED ORDER — VALACYCLOVIR HCL 500 MG PO TABS: ORAL_TABLET | ORAL | 6 refills | Status: DC

## 2016-05-01 MED ORDER — PROGESTERONE MICRONIZED 200 MG PO CAPS: 200.0000 mg | ORAL_CAPSULE | Freq: Every day | ORAL | 4 refills | Status: DC

## 2016-05-01 MED ORDER — ESTRADIOL 0.075 MG/24HR TD PTTW: 1.0000 | MEDICATED_PATCH | TRANSDERMAL | 4 refills | Status: DC

## 2016-05-01 NOTE — Patient Instructions (Signed)
Health Maintenance for Postmenopausal Women Menopause is a normal process in which your reproductive ability comes to an end. This process happens gradually over a span of months to years, usually between the ages of 33 and 38. Menopause is complete when you have missed 12 consecutive menstrual periods. It is important to talk with your health care provider about some of the most common conditions that affect postmenopausal women, such as heart disease, cancer, and bone loss (osteoporosis). Adopting a healthy lifestyle and getting preventive care can help to promote your health and wellness. Those actions can also lower your chances of developing some of these common conditions. What should I know about menopause? During menopause, you may experience a number of symptoms, such as:  Moderate-to-severe hot flashes.  Night sweats.  Decrease in sex drive.  Mood swings.  Headaches.  Tiredness.  Irritability.  Memory problems.  Insomnia. Choosing to treat or not to treat menopausal changes is an individual decision that you make with your health care provider. What should I know about hormone replacement therapy and supplements? Hormone therapy products are effective for treating symptoms that are associated with menopause, such as hot flashes and night sweats. Hormone replacement carries certain risks, especially as you become older. If you are thinking about using estrogen or estrogen with progestin treatments, discuss the benefits and risks with your health care provider. What should I know about heart disease and stroke? Heart disease, heart attack, and stroke become more likely as you age. This may be due, in part, to the hormonal changes that your body experiences during menopause. These can affect how your body processes dietary fats, triglycerides, and cholesterol. Heart attack and stroke are both medical emergencies. There are many things that you can do to help prevent heart disease  and stroke:  Have your blood pressure checked at least every 1-2 years. High blood pressure causes heart disease and increases the risk of stroke.  If you are 48-61 years old, ask your health care provider if you should take aspirin to prevent a heart attack or a stroke.  Do not use any tobacco products, including cigarettes, chewing tobacco, or electronic cigarettes. If you need help quitting, ask your health care provider.  It is important to eat a healthy diet and maintain a healthy weight.  Be sure to include plenty of vegetables, fruits, low-fat dairy products, and lean protein.  Avoid eating foods that are high in solid fats, added sugars, or salt (sodium).  Get regular exercise. This is one of the most important things that you can do for your health.  Try to exercise for at least 150 minutes each week. The type of exercise that you do should increase your heart rate and make you sweat. This is known as moderate-intensity exercise.  Try to do strengthening exercises at least twice each week. Do these in addition to the moderate-intensity exercise.  Know your numbers.Ask your health care provider to check your cholesterol and your blood glucose. Continue to have your blood tested as directed by your health care provider. What should I know about cancer screening? There are several types of cancer. Take the following steps to reduce your risk and to catch any cancer development as early as possible. Breast Cancer  Practice breast self-awareness.  This means understanding how your breasts normally appear and feel.  It also means doing regular breast self-exams. Let your health care provider know about any changes, no matter how small.  If you are 40 or older,  have a clinician do a breast exam (clinical breast exam or CBE) every year. Depending on your age, family history, and medical history, it may be recommended that you also have a yearly breast X-ray (mammogram).  If you  have a family history of breast cancer, talk with your health care provider about genetic screening.  If you are at high risk for breast cancer, talk with your health care provider about having an MRI and a mammogram every year.  Breast cancer (BRCA) gene test is recommended for women who have family members with BRCA-related cancers. Results of the assessment will determine the need for genetic counseling and BRCA1 and for BRCA2 testing. BRCA-related cancers include these types:  Breast. This occurs in males or females.  Ovarian.  Tubal. This may also be called fallopian tube cancer.  Cancer of the abdominal or pelvic lining (peritoneal cancer).  Prostate.  Pancreatic. Cervical, Uterine, and Ovarian Cancer  Your health care provider may recommend that you be screened regularly for cancer of the pelvic organs. These include your ovaries, uterus, and vagina. This screening involves a pelvic exam, which includes checking for microscopic changes to the surface of your cervix (Pap test).  For women ages 21-65, health care providers may recommend a pelvic exam and a Pap test every three years. For women ages 23-65, they may recommend the Pap test and pelvic exam, combined with testing for human papilloma virus (HPV), every five years. Some types of HPV increase your risk of cervical cancer. Testing for HPV may also be done on women of any age who have unclear Pap test results.  Other health care providers may not recommend any screening for nonpregnant women who are considered low risk for pelvic cancer and have no symptoms. Ask your health care provider if a screening pelvic exam is right for you.  If you have had past treatment for cervical cancer or a condition that could lead to cancer, you need Pap tests and screening for cancer for at least 20 years after your treatment. If Pap tests have been discontinued for you, your risk factors (such as having a new sexual partner) need to be reassessed  to determine if you should start having screenings again. Some women have medical problems that increase the chance of getting cervical cancer. In these cases, your health care provider may recommend that you have screening and Pap tests more often.  If you have a family history of uterine cancer or ovarian cancer, talk with your health care provider about genetic screening.  If you have vaginal bleeding after reaching menopause, tell your health care provider.  There are currently no reliable tests available to screen for ovarian cancer. Lung Cancer  Lung cancer screening is recommended for adults 99-83 years old who are at high risk for lung cancer because of a history of smoking. A yearly low-dose CT scan of the lungs is recommended if you:  Currently smoke.  Have a history of at least 30 pack-years of smoking and you currently smoke or have quit within the past 15 years. A pack-year is smoking an average of one pack of cigarettes per day for one year. Yearly screening should:  Continue until it has been 15 years since you quit.  Stop if you develop a health problem that would prevent you from having lung cancer treatment. Colorectal Cancer  This type of cancer can be detected and can often be prevented.  Routine colorectal cancer screening usually begins at age 72 and continues  through age 75.  If you have risk factors for colon cancer, your health care provider may recommend that you be screened at an earlier age.  If you have a family history of colorectal cancer, talk with your health care provider about genetic screening.  Your health care provider may also recommend using home test kits to check for hidden blood in your stool.  A small camera at the end of a tube can be used to examine your colon directly (sigmoidoscopy or colonoscopy). This is done to check for the earliest forms of colorectal cancer.  Direct examination of the colon should be repeated every 5-10 years until  age 75. However, if early forms of precancerous polyps or small growths are found or if you have a family history or genetic risk for colorectal cancer, you may need to be screened more often. Skin Cancer  Check your skin from head to toe regularly.  Monitor any moles. Be sure to tell your health care provider:  About any new moles or changes in moles, especially if there is a change in a mole's shape or color.  If you have a mole that is larger than the size of a pencil eraser.  If any of your family members has a history of skin cancer, especially at a Kimble Delaurentis age, talk with your health care provider about genetic screening.  Always use sunscreen. Apply sunscreen liberally and repeatedly throughout the day.  Whenever you are outside, protect yourself by wearing long sleeves, pants, a wide-brimmed hat, and sunglasses. What should I know about osteoporosis? Osteoporosis is a condition in which bone destruction happens more quickly than new bone creation. After menopause, you may be at an increased risk for osteoporosis. To help prevent osteoporosis or the bone fractures that can happen because of osteoporosis, the following is recommended:  If you are 19-50 years old, get at least 1,000 mg of calcium and at least 600 mg of vitamin D per day.  If you are older than age 50 but younger than age 70, get at least 1,200 mg of calcium and at least 600 mg of vitamin D per day.  If you are older than age 70, get at least 1,200 mg of calcium and at least 800 mg of vitamin D per day. Smoking and excessive alcohol intake increase the risk of osteoporosis. Eat foods that are rich in calcium and vitamin D, and do weight-bearing exercises several times each week as directed by your health care provider. What should I know about how menopause affects my mental health? Depression may occur at any age, but it is more common as you become older. Common symptoms of depression include:  Low or sad  mood.  Changes in sleep patterns.  Changes in appetite or eating patterns.  Feeling an overall lack of motivation or enjoyment of activities that you previously enjoyed.  Frequent crying spells. Talk with your health care provider if you think that you are experiencing depression. What should I know about immunizations? It is important that you get and maintain your immunizations. These include:  Tetanus, diphtheria, and pertussis (Tdap) booster vaccine.  Influenza every year before the flu season begins.  Pneumonia vaccine.  Shingles vaccine. Your health care provider may also recommend other immunizations. This information is not intended to replace advice given to you by your health care provider. Make sure you discuss any questions you have with your health care provider. Document Released: 03/22/2005 Document Revised: 08/18/2015 Document Reviewed: 11/01/2014 Elsevier Interactive Patient   Education  2017 [employer]  

## 2016-05-01 NOTE — Progress Notes (Signed)
Heather Chambers 09/15/1955 [id number]    History:    Presents for annual exam. Postmenopausal no bleeding on HRT,still having hot flashes during night time was in poor sleep 4-5 days of each week. History of normal pap and mammogram. History of hypothyroidism, managed by primary care. Negative colonoscopy in 2011. HSV history rare outbreaks. Continues to have migraines managed with Imitrex. Having right foot pain contemplating joint replacement due to arthritis. 2014 T score -1.5 at spine, hip +0.6  Past medical history, past surgical history, family history and social history were all reviewed and documented in the EPIC chart.Works ay [employer], [employer]. Has married son.   ROS:  A ROS was performed and pertinent positives and negatives are included.  Exam:  Vitals:   05/01/16 0839  BP: 120/74  Weight: 122 lb (55.3 kg)  Height: 5\' 3"  (1.6 m)   Body mass index is 21.61 kg/m.   General appearance:  Normal Thyroid:  Symmetrical, normal in size, without palpable masses or nodularity. Respiratory  Auscultation:  Clear without wheezing or rhonchi Cardiovascular  Auscultation:  Regular rate, without rubs, murmurs or gallops  Edema/varicosities:  Not grossly evident Abdominal  Soft,nontender, without masses, guarding or rebound.  Liver/spleen:  No organomegaly noted  Hernia:  None appreciated  Skin  Inspection:  Grossly normal   Breasts: Examined lying and sitting.     Right: Without masses, retractions, discharge or axillary adenopathy.     Left: Without masses, retractions, discharge or axillary adenopathy. Gentitourinary   Inguinal/mons:  Normal without inguinal adenopathy  External genitalia:  Normal  BUS/Urethra/Skene's glands:  Normal  Vagina: Atrophic  Cervix:  Normal  Uterus:  normal in size, shape and contour.  Midline and mobile  Adnexa/parametria:     Rt: Without masses or tenderness.   Lt: Without masses or tenderness.  Anus and perineum: Normal  Digital  rectal exam: Normal sphincter tone without palpated masses or tenderness  Assessment/Plan:  61 y.o. MWF G1 P1  for annual exam with complain of hot flashes and vaginal dryness.  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 Also try Premarin vaginal cream 2-3 times weekly small amount vaginally, continue with vaginal lubricants. HRT risks of blood clot strokes and breast cancer reviewed.  Valtrex 500 mg twice daily for 3-5 days as needed for HSV outbreaks, Healthy lifestyle, diet and exercise, calcium rich diet, and daily vit. D 2000 U. SBEs, Continue 3 D mammogram for dense breast. Pap normal with negative HR HPV 2017, new screening guidelines reviewed. Instructed to schedule DEXA.   [name],Bobbyjo Marulanda J WHNP, 9:22 AM 05/01/2016

## 2016-05-02 LAB — URINALYSIS W MICROSCOPIC + REFLEX CULTURE
Bacteria, UA: NONE SEEN [HPF]
Bilirubin Urine: NEGATIVE
CASTS: NONE SEEN [LPF]
CRYSTALS: NONE SEEN [HPF]
Glucose, UA: NEGATIVE
Hgb urine dipstick: NEGATIVE
KETONES UR: NEGATIVE
Nitrite: NEGATIVE
Protein, ur: NEGATIVE
SPECIFIC GRAVITY, URINE: 1.015 (ref 1.001–1.035)
Squamous Epithelial / LPF: NONE SEEN [HPF] (ref <=5)
WBC, UA: NONE SEEN WBC/HPF (ref <=5)
Yeast: NONE SEEN [HPF]
pH: 6 (ref 5.0–8.0)

## 2016-05-03 LAB — URINE CULTURE: ORGANISM ID, BACTERIA: NO GROWTH

## 2016-05-06 ENCOUNTER — Telehealth: Payer: Self-pay | Admitting: *Deleted

## 2016-05-06 NOTE — Telephone Encounter (Addendum)
Pt states she was schedule for surgery 05/31/2016, but was surprised to be scheduled immediately for surgery. Pt states she has questions concerning the surgery. Left message on work phone apologizing for the late call, and that I would call her alternate phone. I left a message informing pt that if she had general questions concerning her surgery recovery I would be able to help, but if she had technical questions she would need to make and appt with Dr. Al CorpusHyatt. 05/07/2016-Pt called states she feels I can answer her questions and to call prior to her 3:00pm meeting. Left message that I would call again, hopefully prior to her meeting. Left message I would try again later today or possibly tomorrow to answer her questions.05/08/2016-I spoke with pt, she asked the recovery prognosis, post op activity, pain management, long term implant integrity, and if she would be able to walk 4 months from April for a [place] trip. I told pt that in over 95% of the implant surgeries performed by Dr. Al CorpusHyatt there was full to close to full range of motion in the implant area, with home PT and on occasion PT from outside agency, immediate post op recovery was RICE, not weight bearing more than 15 minutes/hour for the 1st week post op, wearing the boot at all times, and keeping the dressing dry. I told pt after 1st POV evaluation Dr. Al CorpusHyatt would tell her if she could increase activity by 15 minutes/hour. I told her she would be in the walking boot 1-2 weeks, then transfer to surgical shoe for 2-4 weeks, then gradually at Dr. Geryl RankinsHyatt's discretion transfer to athletic shoe. Pt asked type of implant and I informed pt after reviewing LOV she would have a silicone implant. I told pt she would be prescribed a narcotic and if necessary and could tolerate she could take OTC Ibuprofen as package instructs in between the dosing of the narcotic medication. Pt states understanding and thanked me for the information.

## 2016-05-08 DIAGNOSIS — R52 Pain, unspecified: Secondary | ICD-10-CM

## 2016-05-09 ENCOUNTER — Other Ambulatory Visit: Payer: Self-pay | Admitting: Gynecology

## 2016-05-09 DIAGNOSIS — Z1231 Encounter for screening mammogram for malignant neoplasm of breast: Secondary | ICD-10-CM

## 2016-05-28 ENCOUNTER — Other Ambulatory Visit: Payer: Self-pay | Admitting: Gynecology

## 2016-05-28 ENCOUNTER — Ambulatory Visit
Admission: RE | Admit: 2016-05-28 | Discharge: 2016-05-28 | Disposition: A | Discharge status: Home / Self Care | Payer: 59 | Source: Ambulatory Visit | Attending: Gynecology | Admitting: Gynecology

## 2016-05-28 DIAGNOSIS — Z1231 Encounter for screening mammogram for malignant neoplasm of breast: Secondary | ICD-10-CM | POA: Diagnosis not present

## 2016-05-29 ENCOUNTER — Other Ambulatory Visit: Payer: Self-pay | Admitting: Podiatry

## 2016-05-29 ENCOUNTER — Telehealth: Payer: Self-pay | Admitting: *Deleted

## 2016-05-29 MED ORDER — CLINDAMYCIN HCL 150 MG PO CAPS: 150.0000 mg | ORAL_CAPSULE | Freq: Three times a day (TID) | ORAL | 0 refills | Status: DC

## 2016-05-29 MED ORDER — HYDROMORPHONE HCL 4 MG PO TABS: 4.0000 mg | ORAL_TABLET | ORAL | 0 refills | Status: DC | PRN

## 2016-05-29 MED ORDER — ONDANSETRON HCL 4 MG PO TABS: 4.0000 mg | ORAL_TABLET | Freq: Three times a day (TID) | ORAL | 0 refills | Status: DC | PRN

## 2016-05-29 NOTE — Telephone Encounter (Signed)
"  I am scheduled for surgery on Friday.  I have a question about pre-op instructions.  I want to know when I'm to use this scrub brush that you gave me."  You are to supposed to clean your foot the night before.  "Is it okay for me to use it the morning of because I want to take a shower."  Yes, that is fine.

## 2016-05-31 ENCOUNTER — Encounter: Payer: Self-pay | Admitting: Podiatry

## 2016-05-31 DIAGNOSIS — M2021 Hallux rigidus, right foot: Secondary | ICD-10-CM | POA: Diagnosis not present

## 2016-05-31 DIAGNOSIS — E039 Hypothyroidism, unspecified: Secondary | ICD-10-CM | POA: Diagnosis not present

## 2016-05-31 DIAGNOSIS — M2011 Hallux valgus (acquired), right foot: Secondary | ICD-10-CM | POA: Diagnosis not present

## 2016-05-31 DIAGNOSIS — M25571 Pain in right ankle and joints of right foot: Secondary | ICD-10-CM | POA: Diagnosis not present

## 2016-05-31 DIAGNOSIS — M205X1 Other deformities of toe(s) (acquired), right foot: Secondary | ICD-10-CM | POA: Diagnosis not present

## 2016-05-31 DIAGNOSIS — M79671 Pain in right foot: Secondary | ICD-10-CM | POA: Diagnosis not present

## 2016-05-31 HISTORY — PX: FOOT SURGERY: SHX648

## 2016-06-06 ENCOUNTER — Ambulatory Visit (INDEPENDENT_AMBULATORY_CARE_PROVIDER_SITE_OTHER): Payer: 59

## 2016-06-06 ENCOUNTER — Ambulatory Visit (INDEPENDENT_AMBULATORY_CARE_PROVIDER_SITE_OTHER): Payer: Self-pay | Admitting: Podiatry

## 2016-06-06 ENCOUNTER — Encounter: Payer: Self-pay | Admitting: Podiatry

## 2016-06-06 DIAGNOSIS — M205X1 Other deformities of toe(s) (acquired), right foot: Secondary | ICD-10-CM | POA: Diagnosis not present

## 2016-06-06 NOTE — Progress Notes (Signed)
She presents today 1 week status post Keller arthroplasty with a single silicone implant right foot. States that it's a little sore but it's been doing pretty good.  Objective: Vital signs are stable she's alert and oriented 3 pulses are palpable. Dry sterile dressing was removed demonstrates rectus hallux minimal edema no erythema cellulitis drainage or odor she has good range of motion of the first metatarsophalangeal joint. Radiographs confirm nice placement to [facility] single silicone implant.  Assessment: Well-healing surgical foot.  Plan: Encourage range of motion exercises light compressive dressing today was placed placed her in a Darco shoe replaced her Cam Walker which was broken and I will follow-up with her in 1 week.

## 2016-06-13 ENCOUNTER — Ambulatory Visit (INDEPENDENT_AMBULATORY_CARE_PROVIDER_SITE_OTHER): Payer: 59 | Admitting: Podiatry

## 2016-06-13 DIAGNOSIS — M205X1 Other deformities of toe(s) (acquired), right foot: Secondary | ICD-10-CM

## 2016-06-13 NOTE — Progress Notes (Signed)
She represents today for her Lorenz CoasterKeller arthroplasty single silicone implant date of surgery was 05/31/2016. She states that is doing well she has some pain on the dorsal side with tightness. States that she continues to utilize her CAM boot and does exercises.  Objective: No erythema edema cellulitis drainage or odor. She has good range of motion of the first metatarsophalangeal joints passively actively is less motion. Seems to be slightly restricted. Minimal edema no erythema cellulitis drainage or odor sutures were removed today margins remain well coapted.  Assessment: Well-healing surgical foot.  Plan: Posterior decompression anklet in a Darco shoe I will follow-up with her in 2 weeks. I will allow her to start getting this wet.

## 2016-06-15 ENCOUNTER — Other Ambulatory Visit: Payer: Self-pay | Admitting: Family Medicine

## 2016-06-17 ENCOUNTER — Other Ambulatory Visit: Payer: Self-pay | Admitting: Allergy and Immunology

## 2016-06-17 DIAGNOSIS — J3089 Other allergic rhinitis: Secondary | ICD-10-CM

## 2016-06-17 DIAGNOSIS — J321 Chronic frontal sinusitis: Secondary | ICD-10-CM

## 2016-06-17 MED ORDER — IPRATROPIUM BROMIDE 0.06 % NA SOLN: 2.0000 | Freq: Three times a day (TID) | NASAL | 0 refills | Status: DC

## 2016-06-26 ENCOUNTER — Encounter: Payer: Self-pay | Admitting: Gynecology

## 2016-06-27 ENCOUNTER — Ambulatory Visit (INDEPENDENT_AMBULATORY_CARE_PROVIDER_SITE_OTHER): Payer: Self-pay | Admitting: Podiatry

## 2016-06-27 ENCOUNTER — Ambulatory Visit (INDEPENDENT_AMBULATORY_CARE_PROVIDER_SITE_OTHER): Payer: 59

## 2016-06-27 ENCOUNTER — Encounter: Payer: Self-pay | Admitting: Podiatry

## 2016-06-27 DIAGNOSIS — M205X1 Other deformities of toe(s) (acquired), right foot: Secondary | ICD-10-CM

## 2016-06-29 NOTE — Progress Notes (Signed)
She presents today date of surgery 05/31/2016 status post Lorenz CoasterKeller arthroplasty single silicone implant right foot she states it is feeling okay little sore now than him on it more.  Objective: Vital signs are stable she is alert and oriented 3. Pulses are palpable. She has great range of motion of the first metatarsophalangeal joint of the right foot. Radiographs demonstrate a well healing Keller bunion implant no complications.  Assessment: I'm recommending that she try to get back to more normal activity in hopes that this will help loosen up the joint itself. She's able to place her foot in a pair of tennis shoes today.  Plan: Get back to her regular routine follow-up with me in 1 month

## 2016-07-02 NOTE — Progress Notes (Signed)
Keller arthroplasty with silicone implant 1st MPJ right foot

## 2016-07-09 ENCOUNTER — Telehealth: Payer: Self-pay | Admitting: *Deleted

## 2016-07-09 ENCOUNTER — Ambulatory Visit (INDEPENDENT_AMBULATORY_CARE_PROVIDER_SITE_OTHER): Payer: 59 | Admitting: Allergy and Immunology

## 2016-07-09 ENCOUNTER — Other Ambulatory Visit: Payer: Self-pay | Admitting: Women's Health

## 2016-07-09 ENCOUNTER — Encounter: Payer: Self-pay | Admitting: Allergy and Immunology

## 2016-07-09 DIAGNOSIS — J321 Chronic frontal sinusitis: Secondary | ICD-10-CM | POA: Diagnosis not present

## 2016-07-09 DIAGNOSIS — J3089 Other allergic rhinitis: Secondary | ICD-10-CM

## 2016-07-09 MED ORDER — BECLOMETHASONE DIPROPIONATE 80 MCG/ACT NA AERS: 1.0000 | INHALATION_SPRAY | Freq: Every day | NASAL | 1 refills | Status: DC

## 2016-07-09 MED ORDER — ESTRADIOL-NORETHINDRONE ACET 1-0.5 MG PO TABS: 1.0000 | ORAL_TABLET | Freq: Every day | ORAL | 3 refills | Status: DC

## 2016-07-09 NOTE — Progress Notes (Signed)
Follow-up Note  RE: Heather Chambers MRN: [id number] DOB: 1955-10-19 Date of Office Visit: 07/09/2016  Primary care provider: Natalia LeatherwoodKuneff, Renee A, DO Referring provider: Natalia LeatherwoodKuneff, Renee A, DO  History of present illness: Heather Chambers is a 61 y.o. 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  She was last seen in this clinic in July 2017.  She reports that over the past year her symptoms have improved significantly while using nasal saline lavage and Qnasl.  While she still experiences nasal congestion and postnasal drainage, the symptoms are much less frequent and severe than they have been.  In addition, she states that she has not been having frequent sinus infections like she had been previously.  She has no complaints today.   Assessment and plan: Allergic rhinitis with probable nonallergic component Improved and stable.  Continue appropriate allergen avoidance measures, Qnasl as needed, ipratropium nasal as needed, and guaifenesin/PSE as needed.   Meds ordered this encounter  Medications  . Beclomethasone Dipropionate (QNASL) 80 MCG/ACT AERS    Sig: Place 1 spray into the nose daily.    Dispense:  26.1 g    Refill:  1    Physical examination: Blood pressure 118/68, pulse 82, resp. rate 14, height 5\' 3"  (1.6 m), weight 124 lb (56.2 kg), SpO2 98 %.  General: Alert, interactive, in no acute distress. HEENT: TMs pearly gray, turbinates mildly edematous without discharge, post-pharynx unremarkable. Neck: Supple without lymphadenopathy. Lungs: Clear to auscultation without wheezing, rhonchi or rales. CV: Normal S1, S2 without murmurs. Skin: Warm and dry, without lesions or rashes.  The following portions of the patient's history were reviewed and updated as appropriate: allergies, current medications, past family history, past medical history, past social history, past surgical history and problem list.  Allergies as of 07/09/2016    Reactions   Augmentin [amoxicillin-pot Clavulanate] Nausea And Vomiting   Cefdinir Hives   Etodolac    "LODINE"   Phenergan [promethazine Hcl]    Restlessness, twitching      Medication List       Accurate as of 07/09/16  6:57 PM. Always use your most recent med list.          Beclomethasone Dipropionate 80 MCG/ACT Aers Commonly known as:  QNASL Place 1 spray into the nose daily.   estradiol-norethindrone 1-0.5 MG tablet Commonly known as:  ACTIVELLA Take 1 tablet by mouth daily.   HYDROmorphone 4 MG tablet Commonly known as:  DILAUDID Take 1 tablet (4 mg total) by mouth every 4 (four) hours as needed for severe pain.   ipratropium 0.06 % nasal spray Commonly known as:  ATROVENT Place 2 sprays into both nostrils 3 (three) times daily.   PREMARIN vaginal cream Generic drug:  conjugated estrogens APPLY 2-3 TIMES A WEEK   SUMAtriptan 100 MG tablet Commonly known as:  IMITREX TAKE 1 TABLET ONCE. MAY REPEAT IN 2 HOURS IF HEADACHE PERSISTS OR RECURS   SYNTHROID 88 MCG tablet Generic drug:  levothyroxine TAKE 1 TABLET DAILY BEFORE BREAKFAST. DO NOT TAKE WITH CALCIUM OR EAT DAIRY PRODUCTS WITHIN 2 HOURS OF TAKING THIS MEDICINE.   valACYclovir 500 MG tablet Commonly known as:  VALTREX Take 1 tablet by mouth twice daily as needed.       Allergies  Allergen Reactions  . Augmentin [Amoxicillin-Pot Clavulanate] Nausea And Vomiting  . Cefdinir Hives  . Etodolac     "LODINE"  . Phenergan [Promethazine Hcl]  Restlessness, twitching    I appreciate the opportunity to take part in Aracelly's care. Please do not hesitate to contact me with questions.  Sincerely,   R. Jorene Guest, MD

## 2016-07-09 NOTE — Assessment & Plan Note (Signed)
Improved and stable.  Continue appropriate allergen avoidance measures, Qnasl as needed, ipratropium nasal as needed, and guaifenesin/PSE as needed.

## 2016-07-09 NOTE — Telephone Encounter (Signed)
Pt was prescribed Prometrium 200 mg daily at bedtime in the first 12 days and estradiol 0.075 1 patch 2 times weekly. Patient took medication x 2 weeks and stopped due to having hot flashes and just didn't feel good on Rx. Pt went back on Activelle 0.5/0.1 need new Rx for this. Okay to send? Please advise

## 2016-07-09 NOTE — Patient Instructions (Signed)
Allergic rhinitis with probable nonallergic component Improved and stable.  Continue appropriate allergen avoidance measures, Qnasl as needed, ipratropium nasal as needed, and guaifenesin/PSE as needed.   Return in about 1 year (around 07/09/2017), or if symptoms worsen or fail to improve.

## 2016-07-09 NOTE — Telephone Encounter (Signed)
Rx sent to mail order pharmacy. Rx sent.

## 2016-07-09 NOTE — Telephone Encounter (Signed)
Okay to call and Activella 0.5/0.1 take daily. It had been on back order hopefully it is now available.

## 2016-07-12 ENCOUNTER — Other Ambulatory Visit: Payer: Self-pay

## 2016-07-12 MED ORDER — ESTRADIOL-NORETHINDRONE ACET 1-0.5 MG PO TABS: 1.0000 | ORAL_TABLET | Freq: Every day | ORAL | 3 refills | Status: DC

## 2016-07-12 NOTE — Telephone Encounter (Signed)
Pharmacy sent a note that patient has always taken Brand Only Activella. The Rx was submitted was for generic. I resent it as Brand only.

## 2016-07-18 ENCOUNTER — Encounter: Payer: Self-pay | Admitting: Podiatry

## 2016-07-18 ENCOUNTER — Ambulatory Visit (INDEPENDENT_AMBULATORY_CARE_PROVIDER_SITE_OTHER): Payer: 59

## 2016-07-18 ENCOUNTER — Ambulatory Visit (INDEPENDENT_AMBULATORY_CARE_PROVIDER_SITE_OTHER): Payer: Self-pay | Admitting: Podiatry

## 2016-07-18 DIAGNOSIS — M205X1 Other deformities of toe(s) (acquired), right foot: Secondary | ICD-10-CM

## 2016-07-18 MED ORDER — ESTRADIOL-NORETHINDRONE ACET 0.5-0.1 MG PO TABS: 1.0000 | ORAL_TABLET | Freq: Every day | ORAL | 3 refills | Status: DC

## 2016-07-18 NOTE — Telephone Encounter (Signed)
Pharmacy called with questions about Activelle 0.5/0.1mg , I told pharmacist d/c estrogen/progesterone pt is no longer taking Rx and okay to mail out generic activelle.

## 2016-07-18 NOTE — Addendum Note (Signed)
Addended by: Ted McalpineWEBB, Eliyas Suddreth on: 07/18/2016 03:24 PM   Modules accepted: Orders

## 2016-07-18 NOTE — Progress Notes (Signed)
She presents today 6 weeks status post Keller bunion repair of the right foot. States it is still little bit sore but seems to be improving.  Objective: Vital signs are stable she's alert and oriented 3. Pulses are palpable. Radiographs and straight well-healing surgical foot. Placement of the arthroplasty and implant. Much decrease in edema from previous radiographs.  Assessment: Well-healing surgical foot.  Plan: Follow up with me in 6 weeks if necessary.

## 2016-08-09 ENCOUNTER — Other Ambulatory Visit: Payer: Self-pay | Admitting: Allergy and Immunology

## 2016-08-09 DIAGNOSIS — J321 Chronic frontal sinusitis: Secondary | ICD-10-CM

## 2016-08-09 DIAGNOSIS — J3089 Other allergic rhinitis: Secondary | ICD-10-CM

## 2016-08-23 ENCOUNTER — Other Ambulatory Visit: Payer: Self-pay | Admitting: Allergy and Immunology

## 2016-08-23 DIAGNOSIS — J321 Chronic frontal sinusitis: Secondary | ICD-10-CM

## 2016-08-23 DIAGNOSIS — J3089 Other allergic rhinitis: Secondary | ICD-10-CM

## 2016-08-30 ENCOUNTER — Ambulatory Visit: Payer: 59

## 2016-10-31 DIAGNOSIS — Z23 Encounter for immunization: Secondary | ICD-10-CM | POA: Diagnosis not present

## 2016-12-11 ENCOUNTER — Other Ambulatory Visit: Payer: Self-pay | Admitting: *Deleted

## 2016-12-11 MED ORDER — SYNTHROID 88 MCG PO TABS: ORAL_TABLET | ORAL | 0 refills | Status: DC

## 2016-12-13 DIAGNOSIS — N951 Menopausal and female climacteric states: Secondary | ICD-10-CM | POA: Diagnosis not present

## 2016-12-14 ENCOUNTER — Other Ambulatory Visit: Payer: Self-pay | Admitting: Family Medicine

## 2016-12-18 DIAGNOSIS — G479 Sleep disorder, unspecified: Secondary | ICD-10-CM | POA: Diagnosis not present

## 2016-12-18 DIAGNOSIS — N951 Menopausal and female climacteric states: Secondary | ICD-10-CM | POA: Diagnosis not present

## 2016-12-18 DIAGNOSIS — R5383 Other fatigue: Secondary | ICD-10-CM | POA: Diagnosis not present

## 2017-01-16 ENCOUNTER — Encounter: Payer: Self-pay | Admitting: Podiatry

## 2017-01-16 ENCOUNTER — Other Ambulatory Visit: Payer: Self-pay

## 2017-01-16 ENCOUNTER — Ambulatory Visit (INDEPENDENT_AMBULATORY_CARE_PROVIDER_SITE_OTHER): Payer: 59

## 2017-01-16 ENCOUNTER — Ambulatory Visit: Payer: 59 | Admitting: Podiatry

## 2017-01-16 DIAGNOSIS — T85848A Pain due to other internal prosthetic devices, implants and grafts, initial encounter: Secondary | ICD-10-CM

## 2017-01-16 DIAGNOSIS — M205X1 Other deformities of toe(s) (acquired), right foot: Secondary | ICD-10-CM

## 2017-01-16 NOTE — Progress Notes (Signed)
She presents today for follow-up of her first metatarsophalangeal joint of her right foot.  Date of surgery was June 05, 2016 her Lorenz CoasterKeller arthroplasty was performed.  She states that she still notices she has edema and swelling and still has a generalized ache to the first metatarsophalangeal joint particularly after wearing any type of elevated heel or even after being on her feet for several hours.  Objective: Vital signs are stable she is alert and oriented x3.  Pulses are palpable.  She has good range of motion of the first metatarsophalangeal joint with dorsiflexion but only approximately 20 degrees.  This is minimally tender on palpation.  There is no crepitation and I am unable to palpate any deep structures such as the implant or grommets.  Radiographs taken today do not demonstrate any type of osseous abnormality Keller implant appears to be in good position there is some soft tissue swelling overlying the implant probably consistent with scar tissue remaining.  Assessment painful Keller arthroplasty single silicone implant with grommets right foot.  Scar tissue is present.  Plan: I am going to encourage her to go to physical therapy for range of motion and pain reduction.  I will follow-up with her when she has completed this therapy.

## 2017-01-22 ENCOUNTER — Encounter: Payer: 59 | Admitting: Family Medicine

## 2017-01-22 ENCOUNTER — Encounter: Payer: Self-pay | Admitting: Family Medicine

## 2017-01-22 ENCOUNTER — Ambulatory Visit (INDEPENDENT_AMBULATORY_CARE_PROVIDER_SITE_OTHER): Payer: 59 | Admitting: Family Medicine

## 2017-01-22 VITALS — BP 117/71 | HR 74 | Temp 98.4°F | Ht 63.0 in | Wt 120.0 lb

## 2017-01-22 DIAGNOSIS — Z79890 Hormone replacement therapy (postmenopausal): Secondary | ICD-10-CM | POA: Insufficient documentation

## 2017-01-22 DIAGNOSIS — Z130 Encounter for screening for diseases of the blood and blood-forming organs and certain disorders involving the immune mechanism: Secondary | ICD-10-CM

## 2017-01-22 DIAGNOSIS — R51 Headache: Secondary | ICD-10-CM

## 2017-01-22 DIAGNOSIS — M21611 Bunion of right foot: Secondary | ICD-10-CM | POA: Diagnosis not present

## 2017-01-22 DIAGNOSIS — Z0000 Encounter for general adult medical examination without abnormal findings: Secondary | ICD-10-CM | POA: Diagnosis not present

## 2017-01-22 DIAGNOSIS — E038 Other specified hypothyroidism: Secondary | ICD-10-CM

## 2017-01-22 DIAGNOSIS — Z131 Encounter for screening for diabetes mellitus: Secondary | ICD-10-CM

## 2017-01-22 DIAGNOSIS — G8929 Other chronic pain: Secondary | ICD-10-CM

## 2017-01-22 DIAGNOSIS — M1990 Unspecified osteoarthritis, unspecified site: Secondary | ICD-10-CM | POA: Insufficient documentation

## 2017-01-22 LAB — BASIC METABOLIC PANEL
BUN: 12 mg/dL (ref 6–23)
CHLORIDE: 102 meq/L (ref 96–112)
CO2: 27 mEq/L (ref 19–32)
Calcium: 9.1 mg/dL (ref 8.4–10.5)
Creatinine, Ser: 0.68 mg/dL (ref 0.40–1.20)
GFR: 94.02 mL/min (ref >60.00)
GLUCOSE: 79 mg/dL (ref 70–99)
Potassium: 4.3 mEq/L (ref 3.5–5.1)
SODIUM: 137 meq/L (ref 135–145)

## 2017-01-22 LAB — TSH: TSH: 0.31 u[IU]/mL — ABNORMAL LOW (ref 0.35–4.50)

## 2017-01-22 LAB — LIPID PANEL
CHOL/HDL RATIO: 2
CHOLESTEROL: 181 mg/dL (ref 0–200)
HDL: 84.5 mg/dL (ref >39.00)
LDL CALC: 84 mg/dL (ref 0–99)
NonHDL: 96.03
TRIGLYCERIDES: 58 mg/dL (ref 0.0–149.0)
VLDL: 11.6 mg/dL (ref 0.0–40.0)

## 2017-01-22 LAB — HEMOGLOBIN A1C: Hgb A1c MFr Bld: 5 % (ref 4.6–6.5)

## 2017-01-22 LAB — CBC
HCT: 40.7 % (ref 36.0–46.0)
Hemoglobin: 13.2 g/dL (ref 12.0–15.0)
MCHC: 32.6 g/dL (ref 30.0–36.0)
MCV: 93.1 fl (ref 78.0–100.0)
Platelets: 319 10*3/uL (ref 150.0–400.0)
RBC: 4.37 Mil/uL (ref 3.87–5.11)
RDW: 14.1 % (ref 11.5–15.5)
WBC: 7.3 10*3/uL (ref 4.0–10.5)

## 2017-01-22 NOTE — Progress Notes (Signed)
Patient ID: Heather Chambers, female  DOB: 1955/04/19, 61 y.o.   MRN: [id number] Patient Care Team    Relationship Specialty Notifications Start End  Heather Leatherwood, DO PCP - General Family Medicine  01/09/15   Heather Challenger, NP Nurse Practitioner Obstetrics and Gynecology  01/18/16   Heather Fee, MD Attending Physician Gastroenterology  01/18/16   Bobbitt, Heywood Iles, MD Consulting Physician Allergy and Immunology  01/22/17     Subjective:  Heather Chambers is a 61 y.o.  Female  present for CPE. All past medical history, surgical history, allergies, family history, immunizations, medications and social history were updated in the electronic medical record today. All recent labs, ED visits and hospitalizations within the last year were reviewed.  Health maintenance:  Colonoscopy: 10 year, No polys no FHx. 2011. Dr. Christella Hartigan Mammogram: GYN, 05/2016, always normal, birads1 FHX of breast cancer.  Cervical cancer screening: 02/2015 UTD, normal. 3 year f/u, has gyn.  Immunizations: UTD flu 2018 and tdap 02/2009. PNA series completed rpt > 65 Infectious disease screening: HIV and Hep C completed DEXA: Osteopenia, DEXA 2-3 years ago. Gyn completes. Encouraged her to check with them to make certain it is repeated. Assistive device: None Oxygen use: None Patient has a Dental home. Hospitalizations/ED visits: None  Immunization History  Administered Date(s) Administered  . Influenza Whole 11/20/2007, 11/30/2009  . Influenza-Unspecified 10/31/2014, 10/29/2015, 10/31/2016  . Pneumococcal Conjugate-13 07/19/2015  . Pneumococcal Polysaccharide-23 02/24/2009  . Td 02/24/2009     Past Medical History:  Diagnosis Date  . Allergy   . Arthritis   . Chronic pelvic pain in female 1995   LLQ  . Chronic rhinitis   . Health maintenance examination   . Hypothyroidism   . Menopausal symptoms   . Migraine   . Osteoporosis    Osteopenia   Allergies  Allergen Reactions  . Augmentin  [Amoxicillin-Pot Clavulanate] Nausea And Vomiting  . Cefdinir Hives  . Etodolac     "LODINE"  . Phenergan [Promethazine Hcl]     Restlessness, twitching   Past Surgical History:  Procedure Laterality Date  . FOOT SURGERY Right 05/31/2016  . PELVIC LAPAROSCOPY     DIAG LAP   Family History  Problem Relation Age of Onset  . Rheum arthritis Mother   . Allergic rhinitis Mother   . Migraines Mother   . Breast cancer Maternal Grandmother   . Allergic rhinitis Maternal Grandmother   . Heart disease Maternal Grandfather   . Breast cancer Sister   . Angioedema Neg Hx   . Asthma Neg Hx   . Atopy Neg Hx   . Eczema Neg Hx   . Immunodeficiency Neg Hx   . Urticaria Neg Hx    Social History   Socioeconomic History  . Marital status: Married    Spouse name: Not on file  . Number of children: 1  . Years of education: Not on file  . Highest education level: Not on file  Social Needs  . Financial resource strain: Not on file  . Food insecurity - worry: Not on file  . Food insecurity - inability: Not on file  . Transportation needs - medical: Not on file  . Transportation needs - non-medical: Not on file  Occupational History    Employer: [employer]  Tobacco Use  . Smoking status: Never Smoker  . Smokeless tobacco: Never Used  Substance and Sexual Activity  . Alcohol use: Yes    Alcohol/week:  2.4 oz    Types: 4 Cans of beer per week  . Drug use: No  . Sexual activity: Yes    Birth control/protection: Post-menopausal  Other Topics Concern  . Not on file  Social History Narrative   Heather Chambers lives with her husband. She has a grown son who recently moved to [place] for a job. 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    Wears seatbelt.    Exercises 3-5x a week.    Smoke alarm in the home.    Feels safe in relationships   Allergies as of 01/22/2017      Reactions   Augmentin [amoxicillin-pot Clavulanate] Nausea  And Vomiting   Cefdinir Hives   Etodolac    "LODINE"   Phenergan [promethazine Hcl]    Restlessness, twitching      Medication List        Accurate as of 01/22/17  4:30 PM. Always use your most recent med list.          Estradiol-Norethindrone Acet 0.5-0.1 MG tablet Take 1 tablet by mouth daily.   ipratropium 0.06 % nasal spray Commonly known as:  ATROVENT PLACE 2 SPRAYS INTO BOTH NOSTRILS 3 (THREE) TIMES DAILY.   PREMARIN vaginal cream Generic drug:  conjugated estrogens APPLY 2-3 TIMES A WEEK   QNASL 80 MCG/ACT Aers Generic drug:  Beclomethasone Dipropionate PLACE 1 SPRAY INTO THE NOSE DAILY.   SUMAtriptan 100 MG tablet Commonly known as:  IMITREX TAKE 1 TABLET ONCE. MAY REPEAT IN 2 HOURS IF HEADACHE PERSISTS OR RECURS   SYNTHROID 88 MCG tablet Generic drug:  levothyroxine TAKE 1 TABLET DAILY BEFORE BREAKFAST. DO NOT  TAKE WITH CALCIUM OR EAT DAIRY PRODUCTS WITHIN 2 HOURS OF TAKING THIS MEDICATION   valACYclovir 500 MG tablet Commonly known as:  VALTREX Take 1 tablet by mouth twice daily as needed.        No results found for this or any previous visit (from the past 2160 hour(s)).  Mm Screening Breast Tomo Bilateral  Result Date: 03/16/2015 CLINICAL DATA:  Screening. EXAM: DIGITAL SCREENING BILATERAL MAMMOGRAM WITH 3D TOMO WITH CAD COMPARISON:  Previous exam(s). ACR Breast Density Category c: The breast tissue is heterogeneously dense, which may obscure small masses. FINDINGS: There are no findings suspicious for malignancy. Images were processed with CAD. IMPRESSION: No mammographic evidence of malignancy. A result letter of this screening mammogram will be mailed directly to the patient. RECOMMENDATION: Screening mammogram in one year. (Code:SM-B-01Y) BI-RADS CATEGORY  1: Negative. Electronically Signed   By: Ted Mcalpineobrinka  Dimitrova M.D.   On: 03/16/2015 13:03     ROS: 14 pt review of systems performed and negative (unless mentioned in an HPI)  Objective: BP  117/71 (BP Location: Right Arm, Patient Position: Sitting, Cuff Size: Normal)   Pulse 74   Temp 98.4 F (36.9 C)   Ht 5\' 3"  (1.6 m)   Wt 120 lb (54.4 kg)   SpO2 100%   BMI 21.26 kg/m  Gen: Afebrile. No acute distress. Nontoxic in appearance, well developed, well nourished, physically fit, caucasian female.  HENT: AT. [place]. Bilateral TM visualized and normal in appearance. MMM. Bilateral nares without erythema, swelling or drainage. Throat without erythema or exudates. No cough or hoarseness.  Eyes:Pupils Equal Round Reactive to light, Extraocular movements intact,  Conjunctiva without redness, discharge or icterus. Neck/lymp/endocrine: Supple,no lymphadenopathy, no thyromegaly CV: RRR no m/c/g/r, no edema, +2/4 P posterior tibialis pulses Chest: CTAB, no  wheeze or crackles Abd: Soft. flat. NTND. BS present. No  Masses palpated.  Skin: no rashes, purpura or petechiae.  Neuro/MSK:  Normal gait. PERLA. EOMi. Alert. Oriented x3. Cranial nerves II through XII intact. Muscle strength 5/5 upper/lower extremity. DTRs equal bilaterally. Psych: Normal affect, dress and demeanor. Normal speech. Normal thought content and judgment.    Assessment/plan: Heather Chambers is a 61 y.o. female present for CPE. Encounter for preventive health examination Colonoscopy: 10 year, No polys no FHx. 2011. Dr. Christella HartiganJacobs Mammogram: GYN, 05/2016, always normal, birads1 FHX of breast cancer.  Cervical cancer screening: 02/2015 UTD, normal. 3 year f/u, has gyn.  Immunizations: UTD flu 2018 and tdap 02/2009. PNA series completed rpt > 65 Infectious disease screening: HIV and Hep C completed DEXA: Osteopenia, DEXA 2-3 years ago. Gyn completes. Encouraged her to check with them to make certain it is repeated.  Other specified hypothyroidism Refill on synthroid will be filled after results of lab.  - CBC - Basic Metabolic Panel (BMET) - TSH - Lipid panel - HgB A1c  Hormone replacement therapy (HRT) - Currently prescribed  estrogen and progesterone through gyn. Continue routine follow ups.  - CBC - Basic Metabolic Panel (BMET) - TSH - Lipid panel - HgB A1c  Diabetes mellitus screening - HgB A1c Screening for deficiency anemia - CBC  Chronic nonintractable headache, unspecified headache type - currently takes Imitrex about twice a month. She has been on multiple daily medication through headache clinic, but did not like the way it made her feel. She saw a commercial for injectable treatments and would like to consider.  - Ambulatory referral to Neurology  Arthritis Chronic mild arthritic changes right knee and hands. Discussed OTC arthritic tx. Supplements such as tumeric or cherry tart extract with review of potential SE profile. Exercise. Tylenol. Knee compression sleeve. If worsening or increase pain could consider daily mobic etc and Knee xray       Return in about 1 year (around 01/22/2018) for CPE.  Electronically signed by: Felix Pacinienee Jeren Dufrane, DO  [facility]

## 2017-01-22 NOTE — Patient Instructions (Addendum)
Health Maintenance, Female Adopting a healthy lifestyle and getting preventive care can go a long way to promote health and wellness. Talk with your health care provider about what schedule of regular examinations is right for you. This is a good chance for you to check in with your provider about disease prevention and staying healthy. In between checkups, there are plenty of things you can do on your own. Experts have done a lot of research about which lifestyle changes and preventive measures are most likely to keep you healthy. Ask your health care provider for more information. Weight and diet Eat a healthy diet  Be sure to include plenty of vegetables, fruits, low-fat dairy products, and lean protein.  Do not eat a lot of foods high in solid fats, added sugars, or salt.  Get regular exercise. This is one of the most important things you can do for your health. ? Most adults should exercise for at least 150 minutes each week. The exercise should increase your heart rate and make you sweat (moderate-intensity exercise). ? Most adults should also do strengthening exercises at least twice a week. This is in addition to the moderate-intensity exercise.  Maintain a healthy weight  Body mass index (BMI) is a measurement that can be used to identify possible weight problems. It estimates body fat based on height and weight. Your health care provider can help determine your BMI and help you achieve or maintain a healthy weight.  For females 20 years of age and older: ? A BMI below 18.5 is considered underweight. ? A BMI of 18.5 to 24.9 is normal. ? A BMI of 25 to 29.9 is considered overweight. ? A BMI of 30 and above is considered obese.  Watch levels of cholesterol and blood lipids  You should start having your blood tested for lipids and cholesterol at 59 years of age, then have this test every 5 years.  You may need to have your cholesterol levels checked more often if: ? Your lipid or  cholesterol levels are high. ? You are older than 59 years of age. ? You are at high risk for heart disease.  Cancer screening Lung Cancer  Lung cancer screening is recommended for adults 55-80 years old who are at high risk for lung cancer because of a history of smoking.  A yearly low-dose CT scan of the lungs is recommended for people who: ? Currently smoke. ? Have quit within the past 15 years. ? Have at least a 30-pack-year history of smoking. A pack year is smoking an average of one pack of cigarettes a day for 1 year.  Yearly screening should continue until it has been 15 years since you quit.  Yearly screening should stop if you develop a health problem that would prevent you from having lung cancer treatment.  Breast Cancer  Practice breast self-awareness. This means understanding how your breasts normally appear and feel.  It also means doing regular breast self-exams. Let your health care provider know about any changes, no matter how small.  If you are in your 20s or 30s, you should have a clinical breast exam (CBE) by a health care provider every 1-3 years as part of a regular health exam.  If you are 40 or older, have a CBE every year. Also consider having a breast X-ray (mammogram) every year.  If you have a family history of breast cancer, talk to your health care provider about genetic screening.  If you are at high risk   for breast cancer, talk to your health care provider about having an MRI and a mammogram every year.  Breast cancer gene (BRCA) assessment is recommended for women who have family members with BRCA-related cancers. BRCA-related cancers include: ? Breast. ? Ovarian. ? Tubal. ? Peritoneal cancers.  Results of the assessment will determine the need for genetic counseling and BRCA1 and BRCA2 testing.  Cervical Cancer Your health care provider may recommend that you be screened regularly for cancer of the pelvic organs (ovaries, uterus, and  vagina). This screening involves a pelvic examination, including checking for microscopic changes to the surface of your cervix (Pap test). You may be encouraged to have this screening done every 3 years, beginning at age 22.  For women ages 56-65, health care providers may recommend pelvic exams and Pap testing every 3 years, or they may recommend the Pap and pelvic exam, combined with testing for human papilloma virus (HPV), every 5 years. Some types of HPV increase your risk of cervical cancer. Testing for HPV may also be done on women of any age with unclear Pap test results.  Other health care providers may not recommend any screening for nonpregnant women who are considered low risk for pelvic cancer and who do not have symptoms. Ask your health care provider if a screening pelvic exam is right for you.  If you have had past treatment for cervical cancer or a condition that could lead to cancer, you need Pap tests and screening for cancer for at least 20 years after your treatment. If Pap tests have been discontinued, your risk factors (such as having a new sexual partner) need to be reassessed to determine if screening should resume. Some women have medical problems that increase the chance of getting cervical cancer. In these cases, your health care provider may recommend more frequent screening and Pap tests.  Colorectal Cancer  This type of cancer can be detected and often prevented.  Routine colorectal cancer screening usually begins at 59 years of age and continues through 59 years of age.  Your health care provider may recommend screening at an earlier age if you have risk factors for colon cancer.  Your health care provider may also recommend using home test kits to check for hidden blood in the stool.  A small camera at the end of a tube can be used to examine your colon directly (sigmoidoscopy or colonoscopy). This is done to check for the earliest forms of colorectal  cancer.  Routine screening usually begins at age 33.  Direct examination of the colon should be repeated every 5-10 years through 59 years of age. However, you may need to be screened more often if early forms of precancerous polyps or small growths are found.  Skin Cancer  Check your skin from head to toe regularly.  Tell your health care provider about any new moles or changes in moles, especially if there is a change in a mole's shape or color.  Also tell your health care provider if you have a mole that is larger than the size of a pencil eraser.  Always use sunscreen. Apply sunscreen liberally and repeatedly throughout the day.  Protect yourself by wearing long sleeves, pants, a wide-brimmed hat, and sunglasses whenever you are outside.  Heart disease, diabetes, and high blood pressure  High blood pressure causes heart disease and increases the risk of stroke. High blood pressure is more likely to develop in: ? People who have blood pressure in the high end of  the normal range (130-139/85-89 mm Hg). ? People who are overweight or obese. ? People who are African American.  If you are 21-29 years of age, have your blood pressure checked every 3-5 years. If you are 3 years of age or older, have your blood pressure checked every year. You should have your blood pressure measured twice-once when you are at a hospital or clinic, and once when you are not at a hospital or clinic. Record the average of the two measurements. To check your blood pressure when you are not at a hospital or clinic, you can use: ? An automated blood pressure machine at a pharmacy. ? A home blood pressure monitor.  If you are between 17 years and 37 years old, ask your health care provider if you should take aspirin to prevent strokes.  Have regular diabetes screenings. This involves taking a blood sample to check your fasting blood sugar level. ? If you are at a normal weight and have a low risk for diabetes,  have this test once every three years after 59 years of age. ? If you are overweight and have a high risk for diabetes, consider being tested at a younger age or more often. Preventing infection Hepatitis B  If you have a higher risk for hepatitis B, you should be screened for this virus. You are considered at high risk for hepatitis B if: ? You were born in a country where hepatitis B is common. Ask your health care provider which countries are considered high risk. ? Your parents were born in a high-risk country, and you have not been immunized against hepatitis B (hepatitis B vaccine). ? You have HIV or AIDS. ? You use needles to inject street drugs. ? You live with someone who has hepatitis B. ? You have had sex with someone who has hepatitis B. ? You get hemodialysis treatment. ? You take certain medicines for conditions, including cancer, organ transplantation, and autoimmune conditions.  Hepatitis C  Blood testing is recommended for: ? Everyone born from [birth year] through 1963. ? Anyone with known risk factors for hepatitis C.  Sexually transmitted infections (STIs)  You should be screened for sexually transmitted infections (STIs) including gonorrhea and chlamydia if: ? You are sexually active and are younger than 59 years of age. ? You are older than 59 years of age and your health care provider tells you that you are at risk for this type of infection. ? Your sexual activity has changed since you were last screened and you are at an increased risk for chlamydia or gonorrhea. Ask your health care provider if you are at risk.  If you do not have HIV, but are at risk, it may be recommended that you take a prescription medicine daily to prevent HIV infection. This is called pre-exposure prophylaxis (PrEP). You are considered at risk if: ? You are sexually active and do not regularly use condoms or know the HIV status of your partner(s). ? You take drugs by injection. ? You are  sexually active with a partner who has HIV.  Talk with your health care provider about whether you are at high risk of being infected with HIV. If you choose to begin PrEP, you should first be tested for HIV. You should then be tested every 3 months for as long as you are taking PrEP. Pregnancy  If you are premenopausal and you may become pregnant, ask your health care provider about preconception counseling.  If you may become  pregnant, take 400 to 800 micrograms (mcg) of folic acid every day.  If you want to prevent pregnancy, talk to your health care provider about birth control (contraception). Osteoporosis and menopause  Osteoporosis is a disease in which the bones lose minerals and strength with aging. This can result in serious bone fractures. Your risk for osteoporosis can be identified using a bone density scan.  If you are 72 years of age or older, or if you are at risk for osteoporosis and fractures, ask your health care provider if you should be screened.  Ask your health care provider whether you should take a calcium or vitamin D supplement to lower your risk for osteoporosis.  Menopause may have certain physical symptoms and risks.  Hormone replacement therapy may reduce some of these symptoms and risks. Talk to your health care provider about whether hormone replacement therapy is right for you. Follow these instructions at home:  Schedule regular health, dental, and eye exams.  Stay current with your immunizations.  Do not use any tobacco products including cigarettes, chewing tobacco, or electronic cigarettes.  If you are pregnant, do not drink alcohol.  If you are breastfeeding, limit how much and how often you drink alcohol.  Limit alcohol intake to no more than 1 drink per day for nonpregnant women. One drink equals 12 ounces of beer, 5 ounces of wine, or 1 ounces of hard liquor.  Do not use street drugs.  Do not share needles.  Ask your health care  provider for help if you need support or information about quitting drugs.  Tell your health care provider if you often feel depressed.  Tell your health care provider if you have ever been abused or do not feel safe at home. This information is not intended to replace advice given to you by your health care provider. Make sure you discuss any questions you have with your health care provider. Document Released: 08/13/2010 Document Revised: 07/06/2015 Document Reviewed: 11/01/2014 Elsevier Interactive Patient Education  2018 [employer]    Arthritis Arthritis means joint pain. It can also mean joint disease. A joint is a place where bones come together. People who have arthritis may have:  Red joints.  Swollen joints.  Stiff joints.  Warm joints.  A fever.  A feeling of being sick.  Follow these instructions at home: Pay attention to any changes in your symptoms. Take these actions to help with your pain and swelling. Medicines  Take over-the-counter and prescription medicines only as told by your doctor.  Do not take aspirin for pain if your doctor says that you may have gout. Activity  Rest your joint if your doctor tells you to.  Avoid activities that make the pain worse.  Exercise your joint regularly as told by your doctor. Try doing exercises like: ? Swimming. ? Water aerobics. ? Biking. ? Walking. Joint Care   If your joint is swollen, keep it raised (elevated) if told by your doctor.  If your joint feels stiff in the morning, try taking a warm shower.  If you have diabetes, do not apply heat without asking your doctor.  If told, apply heat to the joint: ? Put a towel between the joint and the hot pack or heating pad. ? Leave the heat on the area for 20-30 minutes.  If told, apply ice to the joint: ? Put ice in a plastic bag. ? Place a towel between your skin and the bag. ? Leave the ice on for  20 minutes, 2-3 times per day.  Keep all follow-up  visits as told by your doctor. Contact a doctor if:  The pain gets worse.  You have a fever. Get help right away if:  You have very bad pain in your joint.  You have swelling in your joint.  Your joint is red.  Many joints become painful and swollen.  You have very bad back pain.  Your leg is very weak.  You cannot control your pee (urine) or poop (stool). This information is not intended to replace advice given to you by your health care provider. Make sure you discuss any questions you have with your health care provider. Document Released: 04/24/2009 Document Revised: 07/06/2015 Document Reviewed: 04/25/2014 Elsevier Interactive Patient Education  [employer].

## 2017-01-23 ENCOUNTER — Other Ambulatory Visit: Payer: Self-pay | Admitting: Family Medicine

## 2017-01-23 MED ORDER — LEVOTHYROXINE SODIUM 75 MCG PO TABS: 75.0000 ug | ORAL_TABLET | Freq: Every day | ORAL | 3 refills | Status: DC

## 2017-01-23 NOTE — Telephone Encounter (Signed)
Left message for patient to return call to review labs and verify correct pharmacy.

## 2017-01-23 NOTE — Telephone Encounter (Signed)
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 I have lowered he dose just a small amount. Please ask her what pharmacy she wants these sent into, there are 4 in her chart (update please).   Also does she take brand name only (last was for Synthroid name)? If so will need to change script before sending.

## 2017-01-27 DIAGNOSIS — M21611 Bunion of right foot: Secondary | ICD-10-CM | POA: Diagnosis not present

## 2017-01-29 DIAGNOSIS — M21611 Bunion of right foot: Secondary | ICD-10-CM | POA: Diagnosis not present

## 2017-02-05 DIAGNOSIS — M21611 Bunion of right foot: Secondary | ICD-10-CM | POA: Diagnosis not present

## 2017-02-06 ENCOUNTER — Encounter: Payer: Self-pay | Admitting: Family Medicine

## 2017-02-07 DIAGNOSIS — M21611 Bunion of right foot: Secondary | ICD-10-CM | POA: Diagnosis not present

## 2017-02-10 DIAGNOSIS — M21611 Bunion of right foot: Secondary | ICD-10-CM | POA: Diagnosis not present

## 2017-02-13 DIAGNOSIS — M21611 Bunion of right foot: Secondary | ICD-10-CM | POA: Diagnosis not present

## 2017-02-14 ENCOUNTER — Telehealth: Payer: Self-pay | Admitting: *Deleted

## 2017-02-14 NOTE — Telephone Encounter (Signed)
Pt states physical therapist would like to know the degree of motion Dr. Al CorpusHyatt would like in the joint implant of 05/2016.

## 2017-02-15 NOTE — Telephone Encounter (Signed)
Please let the physical therapist no that I want as much motion out of this joint as possible.  However we need at least 25-30 degrees of dorsiflexion and 5 degrees of plantarflexion.

## 2017-02-17 NOTE — Telephone Encounter (Signed)
I informed pt of Dr. Geryl RankinsHyatt's recommendation and I had faxed to [facility]. Faxed Dr. Geryl RankinsHyatt's 02/15/2017 7:51am orders to [facility].

## 2017-02-19 DIAGNOSIS — M21611 Bunion of right foot: Secondary | ICD-10-CM | POA: Diagnosis not present

## 2017-03-10 ENCOUNTER — Other Ambulatory Visit: Payer: Self-pay | Admitting: *Deleted

## 2017-03-10 DIAGNOSIS — M8580 Other specified disorders of bone density and structure, unspecified site: Secondary | ICD-10-CM

## 2017-03-13 ENCOUNTER — Other Ambulatory Visit: Payer: Self-pay | Admitting: Gynecology

## 2017-03-13 DIAGNOSIS — Z13820 Encounter for screening for osteoporosis: Secondary | ICD-10-CM

## 2017-03-14 DIAGNOSIS — M8580 Other specified disorders of bone density and structure, unspecified site: Secondary | ICD-10-CM

## 2017-03-14 HISTORY — DX: Other specified disorders of bone density and structure, unspecified site: M85.80

## 2017-03-24 ENCOUNTER — Ambulatory Visit: Payer: 59 | Admitting: Allergy and Immunology

## 2017-03-27 ENCOUNTER — Ambulatory Visit (INDEPENDENT_AMBULATORY_CARE_PROVIDER_SITE_OTHER): Payer: 59

## 2017-03-27 ENCOUNTER — Encounter: Payer: Self-pay | Admitting: Gynecology

## 2017-03-27 ENCOUNTER — Other Ambulatory Visit: Payer: Self-pay | Admitting: Gynecology

## 2017-03-27 DIAGNOSIS — M8589 Other specified disorders of bone density and structure, multiple sites: Secondary | ICD-10-CM | POA: Diagnosis not present

## 2017-03-27 DIAGNOSIS — Z13820 Encounter for screening for osteoporosis: Secondary | ICD-10-CM

## 2017-03-30 ENCOUNTER — Other Ambulatory Visit: Payer: Self-pay | Admitting: Allergy and Immunology

## 2017-03-30 DIAGNOSIS — J3089 Other allergic rhinitis: Secondary | ICD-10-CM

## 2017-03-30 DIAGNOSIS — J321 Chronic frontal sinusitis: Secondary | ICD-10-CM

## 2017-04-01 ENCOUNTER — Encounter: Payer: Self-pay | Admitting: Neurology

## 2017-04-01 ENCOUNTER — Ambulatory Visit: Payer: 59 | Admitting: Neurology

## 2017-04-01 VITALS — BP 111/69 | HR 76 | Ht 63.5 in | Wt 126.0 lb

## 2017-04-01 DIAGNOSIS — G43709 Chronic migraine without aura, not intractable, without status migrainosus: Secondary | ICD-10-CM | POA: Insufficient documentation

## 2017-04-01 DIAGNOSIS — G43701 Chronic migraine without aura, not intractable, with status migrainosus: Secondary | ICD-10-CM

## 2017-04-01 MED ORDER — SUMATRIPTAN SUCCINATE 11 MG/NOSEPC NA EXHP: 1.0000 | INHALANT_POWDER | Freq: Once | NASAL | 11 refills | Status: DC

## 2017-04-01 MED ORDER — ERENUMAB-AOOE 70 MG/ML ~~LOC~~ SOAJ: 70.0000 mg | SUBCUTANEOUS | 0 refills | Status: DC

## 2017-04-01 NOTE — Patient Instructions (Signed)
Erenumab: Drug information [employer] here. Copyright (914) [id number] [employer]. All rights reserved. (For additional information see "Erenumab: Patient drug information")  For abbreviations and symbols that may be used in [employer] (show table) Brand Names: US  Aimovig;  Aimovig 140 Dose  Pharmacologic Category  Calcitonin Gene-Related Peptide (CGRP) Receptor Antagonist;  Monoclonal Antibody, CGRP Antagonist  Dosing: Adult Migraine prophylaxis: SubQ: Initial: 70 mg once a month; some patients may benefit from 140 mg once a month (given as 2 consecutive 70 mg injections) Missed dose: Administer missed dose as soon as possible, and schedule next dose for 1 month from date of the last dose. Dosing: Renal Impairment: Adult There are no dosage adjustments provided in the manufacturer's labeling (has not been studied); renal impairment is not expected to change the pharmacokinetics of erenumab-aooe. Dosing: Hepatic Impairment: Adult There are no dosage adjustments provided in the manufacturer's labeling (has not been studied); hepatic impairment is not expected to change the pharmacokinetics of erenumab-aooe. Dosing: Geriatric Refer to adult dosing. Dosage Forms Excipient information presented when available (limited, particularly for generics); consult specific product labeling. Solution Auto-injector, Subcutaneous [preservative free]:  Aimovig: 70 mg/mL (1 mL) [contains polysorbate 80] Aimovig 140 Dose: 70 mg/mL (1 mL) [contains polysorbate 80] Generic Equivalent Available (US) No Administration SubQ: For subcutaneous use only; intended for self-administration. Keep out of direct sunlight and allow to come to room temperature for 30 minutes before administration. Do not warm using a heat source and do not shake. Administer in abdomen (avoiding 2 inches around the navel), thigh or upper arm, avoiding areas of skin that are tender, bruised, red or hard. Deliver entire contents  of single-use autoinjector or prefilled syringe. The 140 mg dose should be administered as 2 consecutive 70 mg injections. Use Migraine prophylaxis: Preventive treatment of migraine in adults Adverse Reactions 1% to 10%: Gastrointestinal: Constipation (3%) Immunologic: Antibody development (3% to 6%) Local: Injection site reaction (5% to 6%; including injection site erythema, pain, and pruritus) Neuromuscular & skeletal: Muscle cramps (?2%), muscle spasm (?2%) Contraindications There are no contraindications listed in the manufacturer's labeling. Warnings/Precautions Dosage form specific issues: . Latex: The packaging (needle shield of auto-injector and needle cap of prefilled syringe) may contain latex. Metabolism/Transport Effects None known. Drug Interactions   (For additional information: Launch drug interactions program)   Belimumab: Monoclonal Antibodies may enhance the adverse/toxic effect of Belimumab.  Risk X: Avoid combination Pregnancy Implications Adverse events were not observed in animal reproduction studies. Breast-Feeding Considerations It is not known if erenumab-aooe is present in breast milk. According to the manufacturer, the decision to breastfeed during therapy should consider the risk of infant exposure, the benefits of breastfeeding to the infant, and benefits of treatment to the mother. Monitoring Parameters Number of monthly migraine days Mechanism of Action Erenumab-aooe is a human monoclonal antibody that antagonizes calcitonin gene-related peptide (CGRP) receptor function. Pharmacodynamics/Kinetics Distribution: Vz: 3.86 L Metabolism: Via a nonspecific, nonsaturable proteolytic pathway Bioavailability: 82% Half-life elimination: 28 days Time to peak: ~6 days Pricing: US Solution Auto-injector (Aimovig 140 Dose Subcutaneous) 70 mg/mL (per mL): $345.00 Solution Auto-injector (Aimovig Subcutaneous) 70 mg/mL (per mL): $690.00 Disclaimer: A  representative AWP (Average Wholesale Price) price or price range is provided as reference price only. A range is provided when more than one manufacturer's AWP price is available and uses the low and high price reported by the manufacturers to determine the range. The pricing data should be used for benchmarking purposes only, and as such should not be used alone  to set or adjudicate any prices for reimbursement or purchasing functions or considered to be an exact price for a single product and/or manufacturer. [employer] expressly disclaims all warranties of any kind or nature, whether express or implied, and assumes no liability with respect to accuracy of price or price range data published in its solutions. In no event shall [employer] be liable for special, indirect, incidental, or consequential damages arising from use of price or price range data. Pricing data is updated monthly.

## 2017-04-01 NOTE — Progress Notes (Signed)
aimov 

## 2017-04-01 NOTE — Progress Notes (Signed)
[facility]    Provider:  Dr Lucia Gaskins Referring Provider: Natalia Leatherwood, DO Primary Care Physician:  Natalia Leatherwood, DO  CC:  migraines  HPI:  Heather Chambers is a 62 y.o. female here as a referral from Dr. Claiborne Billings for recurrent migraines. She went to the [facility] and tried and failed multiple medications. Migraines started in college. It was so painful she thought she had a brain tumor. Mother has migraines with aura and vision loss but less so headaches. Patient has severe headaches. She went to the headache wellness center over 10 years ago. For 3 years she went every 3 months, they tried multiple medications and failed multiple medications, the medication caused lots of GI problems.  Imitrex is the only thing that works. Triggered by tension and stress in the evenings. Migraines start in the evenings and she wakes up overnight. It starts at night then it is severe in the morning. Always towards the end of the week. Can last up to 4 days before resolving. Pain can be severe. Imitrex makes her "fuzzy". They are on the right side, also sinus issues may contribute. The migraines are pounding, throbbing with light and sound sensitivity. She has 8 migraine days a month and 15 headache days a month total. No medication.  She has had an MRI in the past and it was negative.   Reviewed notes, labs and imaging from outside physicians, which showed:  TSH 0.31 (follows with pcp), bmp, cbc, hgba1c normal  Reviewed report CT sinuses 07/2004: Clinical Data: Pain under eyes and above left eye on and off for months.   LIMITED CT OF PARANASAL SINUSES - 07/16/04:   Technique: Limited coronal CT images were obtained through the paranasal sinuses without intravenous contrast.  Findings: Imaged paranasal sinuses are clear. No bony sclerosis or thickening to suggest chronic sinusitis. Imaged intracranial contents appear normal. Imaged appearance of the orbits is normal.    IMPRESSION:   Negative study.   Provider: Fredderick Severance  ENT CT 09/2015: reviewed report   FINDINGS: SINUSES: Mild LEFT maxillary sinus mucosal thickening along the floor. Ethmoid, sphenoid and frontal sinuses are well aerated. LEFT onodi air cell. Bilateral frontal sinus air cells. LEFT concha lamella. No abnormal antral expansion or atresia, bony wall thickening/mucoperiosteal reaction. Nasal septum is midline.  OSTIA: RIGHT ostiomeatal unit, frontal and sphenoid ethmoidal recesses are patent. Soft tissue opacifies LEFT ostiomeatal unit.  FACIAL BONES: No acute facial fracture. No destructive bony lesions.  ORBITS: Ocular globes and orbital contents are unremarkable.  IMPRESSION: Mild LEFT maxillary sinus mucosal thickening and soft tissue effacement LEFT ostiomeatal unit.   Review of Systems: Patient complains of symptoms per HPI as well as the following symptoms: headache. Pertinent negatives and positives per HPI. All others negative.   Social History   Socioeconomic History  . Marital status: Married    Spouse name: Not on file  . Number of children: 1  . Years of education: Not on file  . Highest education level: Master's degree (e.g., MA, MS, MEng, MEd, MSW, MBA)  Social Needs  . Financial resource strain: Not on file  . Food insecurity - worry: Not on file  . Food insecurity - inability: Not on file  . Transportation needs - medical: Not on file  . Transportation needs - non-medical: Not on file  Occupational History    Employer: [employer]  Tobacco Use  . Smoking status: Never Smoker  . Smokeless tobacco:  Never Used  Substance and Sexual Activity  . Alcohol use: Yes    Alcohol/week: 2.4 oz    Types: 4 Cans of beer per week    Comment: wine and beer  . Drug use: No  . Sexual activity: Yes    Birth control/protection: Post-menopausal  Other Topics Concern  . Not on file  Social History Narrative   Ms. Pogorzelski lives with her  husband. She has a grown son who recently moved to [place] for a job. 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    Wears seatbelt.    Exercises 3x a week.    Smoke alarm in the home.    Feels safe in relationships   Right handed   Drinks decaf tea and coffee    Family History  Problem Relation Age of Onset  . Allergic rhinitis Mother   . Migraines Mother   . Osteoarthritis Mother   . Breast cancer Maternal Grandmother   . Allergic rhinitis Maternal Grandmother   . Heart disease Maternal Grandfather   . Breast cancer Sister   . Angioedema Neg Hx   . Asthma Neg Hx   . Atopy Neg Hx   . Eczema Neg Hx   . Immunodeficiency Neg Hx   . Urticaria Neg Hx     Past Medical History:  Diagnosis Date  . Allergy   . Arthritis   . Chronic pelvic pain in female 1995   LLQ  . Chronic rhinitis   . Health maintenance examination   . Hypothyroidism   . Menopausal symptoms   . Migraine   . Osteopenia 03/2017   T score -2.1 FRAX 7.5% / 0.7%    Past Surgical History:  Procedure Laterality Date  . FOOT SURGERY Right 05/31/2016   joint replacement  . PELVIC LAPAROSCOPY     DIAG LAP    Current Outpatient Medications  Medication Sig Dispense Refill  . Estradiol-Norethindrone Acet 0.5-0.1 MG tablet Take 1 tablet by mouth daily. 90 tablet 3  . ipratropium (ATROVENT) 0.06 % nasal spray PLACE 2 SPRAYS INTO BOTH NOSTRILS 3 (THREE) TIMES DAILY. 15 mL 4  . levothyroxine (SYNTHROID, LEVOTHROID) 75 MCG tablet Take 1 tablet (75 mcg total) by mouth daily. 90 tablet 3  . PREMARIN vaginal cream APPLY 2-3 TIMES A WEEK  6  . QNASL 80 MCG/ACT AERS PLACE 1 SPRAY INTO THE NOSE DAILY. 8.7 Inhaler 3  . SUMAtriptan (IMITREX) 100 MG tablet TAKE 1 TABLET ONCE. MAY REPEAT IN 2 HOURS IF HEADACHE PERSISTS OR RECURS 18 tablet 4  . valACYclovir (VALTREX) 500 MG tablet Take 1 tablet by mouth twice daily as needed. 30 tablet 6  . Erenumab-aooe (AIMOVIG) 70 MG/ML SOAJ  Inject 70 mg into the skin every 30 (thirty) days. 4 pen 0  . SUMAtriptan Succinate (ONZETRA XSAIL) 11 MG/NOSEPC EXHP Place 1 spray into both nostrils once for 1 dose. Administer 2 nose pieces one per nostril. At onset of migraine can repeat after 2 hours no more than 2 doses in 24 hours 1 each 11   No current facility-administered medications for this visit.     Allergies as of 04/01/2017 - Review Complete 04/01/2017  Allergen Reaction Noted  . Augmentin [amoxicillin-pot clavulanate] Nausea And Vomiting 05/30/2015  . Cefdinir Hives 11/20/2007  . Etodolac  11/20/2007  . Phenergan [promethazine hcl]  09/13/2012    Vitals: BP 111/69 (BP Location: Left Arm, Patient Position: Sitting)   Pulse 76  Ht 5' 3.5" (1.613 m)   Wt 126 lb (57.2 kg)   BMI 21.97 kg/m  Last Weight:  Wt Readings from Last 1 Encounters:  04/01/17 126 lb (57.2 kg)   Last Height:   Ht Readings from Last 1 Encounters:  04/01/17 5' 3.5" (1.613 m)    Physical exam: Exam: Gen: NAD, conversant, well nourised, well groomed                     CV: RRR, no MRG. No Carotid Bruits. No peripheral edema, warm, nontender Eyes: Conjunctivae clear without exudates or hemorrhage  Neuro: Detailed Neurologic Exam  Speech:    Speech is normal; fluent and spontaneous with normal comprehension.  Cognition:    The patient is oriented to person, place, and time;     recent and remote memory intact;     language fluent;     normal attention, concentration,     fund of knowledge Cranial Nerves:    The pupils are equal, round, and reactive to light. The fundi are normal and spontaneous venous pulsations are present. Visual fields are full to finger confrontation. Extraocular movements are intact. Trigeminal sensation is intact and the muscles of mastication are normal. The face is symmetric. The palate elevates in the midline. Hearing intact. Voice is normal. Shoulder shrug is normal. The tongue has normal motion without  fasciculations.   Coordination:    Normal finger to nose and heel to shin. Normal rapid alternating movements.   Gait:    Heel-toe and tandem gait are normal.   Motor Observation:    No asymmetry, no atrophy, and no involuntary movements noted. Tone:    Normal muscle tone.    Posture:    Posture is normal. normal erect    Strength:    Strength is V/V in the upper and lower limbs.      Sensation: intact to LT     Reflex Exam:  DTR's:    Deep tendon reflexes in the upper and lower extremities are normal bilaterally.   Toes:    The toes are downgoing bilaterally.   Clonus:    Clonus is absent.      Assessment/Plan:         Naomie Dean, MD  [facility] [address] [place], [place] [address]  Phone [phone] Fax [phone]

## 2017-04-04 ENCOUNTER — Encounter: Payer: Self-pay | Admitting: Women's Health

## 2017-04-23 ENCOUNTER — Ambulatory Visit: Payer: 59 | Admitting: Family Medicine

## 2017-04-23 ENCOUNTER — Encounter: Payer: Self-pay | Admitting: Family Medicine

## 2017-04-23 VITALS — BP 110/68 | HR 74 | Temp 98.2°F | Resp 20 | Ht 63.5 in | Wt 127.3 lb

## 2017-04-23 DIAGNOSIS — J0100 Acute maxillary sinusitis, unspecified: Secondary | ICD-10-CM

## 2017-04-23 MED ORDER — DOXYCYCLINE HYCLATE 100 MG PO TABS: 100.0000 mg | ORAL_TABLET | Freq: Two times a day (BID) | ORAL | 0 refills | Status: DC

## 2017-04-23 NOTE — Patient Instructions (Signed)
Rest, hydrate.  + nasal spray, mucinex (DM if cough), nettie pot or nasal saline.  Steroid shot today Doxycyline  prescribed, take until completed.  If cough present it can last up to 6-8 weeks.  F/U 2 weeks of not improved.     Sinusitis, Adult Sinusitis is soreness and inflammation of your sinuses. Sinuses are hollow spaces in the bones around your face. They are located:  Around your eyes.  In the middle of your forehead.  Behind your nose.  In your cheekbones.  Your sinuses and nasal passages are lined with a stringy fluid (mucus). Mucus normally drains out of your sinuses. When your nasal tissues get inflamed or swollen, the mucus can get trapped or blocked so air cannot flow through your sinuses. This lets bacteria, viruses, and funguses grow, and that leads to infection. Follow these instructions at home: Medicines  Take, use, or apply over-the-counter and prescription medicines only as told by your doctor. These may include nasal sprays.  If you were prescribed an antibiotic medicine, take it as told by your doctor. Do not stop taking the antibiotic even if you start to feel better. Hydrate and Humidify  Drink enough water to keep your pee (urine) clear or pale yellow.  Use a cool mist humidifier to keep the humidity level in your home above 50%.  Breathe in steam for 10-15 minutes, 3-4 times a day or as told by your doctor. You can do this in the bathroom while a hot shower is running.  Try not to spend time in cool or dry air. Rest  Rest as much as possible.  Sleep with your head raised (elevated).  Make sure to get enough sleep each night. General instructions  Put a warm, moist washcloth on your face 3-4 times a day or as told by your doctor. This will help with discomfort.  Wash your hands often with soap and water. If there is no soap and water, use hand sanitizer.  Do not smoke. Avoid being around people who are smoking (secondhand smoke).  Keep all  follow-up visits as told by your doctor. This is important. Contact a doctor if:  You have a fever.  Your symptoms get worse.  Your symptoms do not get better within 10 days. Get help right away if:  You have a very bad headache.  You cannot stop throwing up (vomiting).  You have pain or swelling around your face or eyes.  You have trouble seeing.  You feel confused.  Your neck is stiff.  You have trouble breathing. This information is not intended to replace advice given to you by your health care provider. Make sure you discuss any questions you have with your health care provider. Document Released: 07/17/2007 Document Revised: 09/24/2015 Document Reviewed: 11/23/2014 Elsevier Interactive Patient Education  [employer]2018 Elsevier Inc.

## 2017-04-23 NOTE — Progress Notes (Signed)
Heather Chambers , Feb 06, 1956, 62 y.o., female MRN: [id number] Patient Care Team    Relationship Specialty Notifications Start End  Ma Hillock, DO PCP - General Family Medicine  01/09/15   Huel Cote, NP Nurse Practitioner Obstetrics and Gynecology  01/18/16   Milus Banister, MD Attending Physician Gastroenterology  01/18/16   Bobbitt, Sedalia Muta, MD Consulting Physician Allergy and Immunology  01/22/17     Chief Complaint  Patient presents with  . URI    congestion,cough x 4 weeks     Subjective: Pt presents for an OV with complaints of congestion  of 4 weeks duration.  Associated symptoms include dry cough, Headache, sore throat, facial pressure and a large amount of phlegm production. She denies current fever, chills, nausea or vomit.   Pt has tried mucinex, Qnsal, alk cold and atrovent to ease their symptoms.   Depression screen [facility] 2/9 01/22/2017 01/18/2016 05/31/2014  Decreased Interest 0 0 0  Down, Depressed, Hopeless 0 0 0  PHQ - 2 Score 0 0 0    Allergies  Allergen Reactions  . Augmentin [Amoxicillin-Pot Clavulanate] Nausea And Vomiting  . Cefdinir Hives  . Etodolac     "LODINE"  . Phenergan [Promethazine Hcl]     Restlessness, twitching   Social History   Tobacco Use  . Smoking status: Never Smoker  . Smokeless tobacco: Never Used  Substance Use Topics  . Alcohol use: Yes    Alcohol/week: 2.4 oz    Types: 4 Cans of beer per week    Comment: wine and beer   Past Medical History:  Diagnosis Date  . Allergy   . Arthritis   . Chronic pelvic pain in female 1995   LLQ  . Chronic rhinitis   . Health maintenance examination   . Hypothyroidism   . Menopausal symptoms   . Migraine   . Osteopenia 03/2017   T score -2.1 FRAX 7.5% / 0.7%   Past Surgical History:  Procedure Laterality Date  . FOOT SURGERY Right 05/31/2016   joint replacement  . PELVIC LAPAROSCOPY     DIAG LAP   Family History  Problem Relation Age of Onset  . Allergic rhinitis  Mother   . Migraines Mother   . Osteoarthritis Mother   . Breast cancer Maternal Grandmother   . Allergic rhinitis Maternal Grandmother   . Heart disease Maternal Grandfather   . Breast cancer Sister   . Angioedema Neg Hx   . Asthma Neg Hx   . Atopy Neg Hx   . Eczema Neg Hx   . Immunodeficiency Neg Hx   . Urticaria Neg Hx    Allergies as of 04/23/2017      Reactions   Augmentin [amoxicillin-pot Clavulanate] Nausea And Vomiting   Cefdinir Hives   Etodolac    "LODINE"   Phenergan [promethazine Hcl]    Restlessness, twitching      Medication List        Accurate as of 04/23/17  1:24 PM. Always use your most recent med list.          Erenumab-aooe 70 MG/ML Soaj Commonly known as:  AIMOVIG Inject 70 mg into the skin every 30 (thirty) days.   Estradiol-Norethindrone Acet 0.5-0.1 MG tablet Take 1 tablet by mouth daily.   ipratropium 0.06 % nasal spray Commonly known as:  ATROVENT PLACE 2 SPRAYS INTO BOTH NOSTRILS 3 (THREE) TIMES DAILY.   levothyroxine 75 MCG tablet Commonly known as:  SYNTHROID, LEVOTHROID Take  1 tablet (75 mcg total) by mouth daily.   PREMARIN vaginal cream Generic drug:  conjugated estrogens APPLY 2-3 TIMES A WEEK   QNASL 80 MCG/ACT Aers Generic drug:  Beclomethasone Dipropionate PLACE 1 SPRAY INTO THE NOSE DAILY.   SUMAtriptan 100 MG tablet Commonly known as:  IMITREX TAKE 1 TABLET ONCE. MAY REPEAT IN 2 HOURS IF HEADACHE PERSISTS OR RECURS   SUMAtriptan Succinate 11 MG/NOSEPC Exhp Commonly known as:  ONZETRA XSAIL Place 1 spray into both nostrils once for 1 dose. Administer 2 nose pieces one per nostril. At onset of migraine can repeat after 2 hours no more than 2 doses in 24 hours   valACYclovir 500 MG tablet Commonly known as:  VALTREX Take 1 tablet by mouth twice daily as needed.       All past medical history, surgical history, allergies, family history, immunizations andmedications were updated in the EMR today and reviewed under  the history and medication portions of their EMR.     ROS: Negative, with the exception of above mentioned in HPI   Objective:  BP 110/68 (BP Location: Right Arm, Patient Position: Sitting, Cuff Size: Normal)   Pulse 74   Temp 98.2 F (36.8 C)   Resp 20   Ht 5' 3.5" (1.613 m)   Wt 127 lb 5.6 oz (57.8 kg)   SpO2 98%   BMI 22.21 kg/m  Body mass index is 22.21 kg/m. Gen: Afebrile. No acute distress. Nontoxic in appearance, well developed, well nourished. Fatigued appearing HENT: AT. [place]. Bilateral TM visualized without erythema or fullness. MMM, no oral lesions. Bilateral nares with erythema, drainage, swelling. Throat without erythema or exudates. Hoarseness, cough and max facial pressure present.  Eyes:Pupils Equal Round Reactive to light, Extraocular movements intact,  Conjunctiva without redness, discharge or icterus. Neck/lymp/endocrine: Supple,no lymphadenopathy CV: RRR  Chest: CTAB, no wheeze or crackles. Good air movement, normal resp effort.  Abd: Soft. . NTND. BS present. .  Neuro: Normal gait. PERLA. EOMi. Alert. Oriented x3  No exam data present No results found. No results found for this or any previous visit (from the past 24 hour(s)).  Assessment/Plan: Heather Chambers is a 62 y.o. female present for OV for  Acute maxillary sinusitis, recurrence not specified Rest, hydrate.  + flonase, mucinex (DM if cough), nettie pot or nasal saline.  IM depo medrol and doxy  prescribed, take until completed.  If cough present it can last up to 6-8 weeks.  F/U 2 weeks of not improved.   - doxycycline (VIBRA-TABS) 100 MG tablet; Take 1 tablet (100 mg total) by mouth 2 (two) times daily.  Dispense: 20 tablet; Refill: 0 .rksn   Reviewed expectations re: course of current medical issues.  Discussed self-management of symptoms.  Outlined signs and symptoms indicating need for more acute intervention.  Patient verbalized understanding and all questions were answered.  Patient  received an After-Visit Summary.    No orders of the defined types were placed in this encounter.    Note is dictated utilizing voice recognition software. Although note has been proof read prior to signing, occasional typographical errors still can be missed. If any questions arise, please do not hesitate to call for verification.   electronically signed by:  Howard Pouch, DO  [place]

## 2017-04-25 ENCOUNTER — Encounter: Payer: Self-pay | Admitting: Family Medicine

## 2017-05-06 ENCOUNTER — Encounter: Payer: 59 | Admitting: Women's Health

## 2017-05-12 ENCOUNTER — Encounter: Payer: Self-pay | Admitting: Neurology

## 2017-05-13 ENCOUNTER — Telehealth: Payer: Self-pay | Admitting: Neurology

## 2017-05-13 ENCOUNTER — Other Ambulatory Visit: Payer: Self-pay | Admitting: Neurology

## 2017-05-13 MED ORDER — FREMANEZUMAB-VFRM 225 MG/1.5ML ~~LOC~~ SOSY: 225.0000 mg | PREFILLED_SYRINGE | SUBCUTANEOUS | 11 refills | Status: DC

## 2017-05-13 NOTE — Telephone Encounter (Signed)
Ajovy card ready to be mailed. Called pt & LVM asking for call back. When she calls back, please confirm her address.

## 2017-05-13 NOTE — Telephone Encounter (Signed)
Ajovy savings card packaged, addressed and sent to medical records for mailing.

## 2017-05-13 NOTE — Telephone Encounter (Signed)
Patient called back and I confirmed her address as same one we have.

## 2017-05-13 NOTE — Telephone Encounter (Signed)
Please send patient an Ajovy co-pay card to her home thank you.

## 2017-05-14 ENCOUNTER — Telehealth: Payer: Self-pay

## 2017-05-14 NOTE — Telephone Encounter (Signed)
We received a prior authorization request for this medication. I have completed and submitted the PA on Cover My Meds and should have a determination within 48-72 hours.  Cover My Meds Key: [id number]

## 2017-05-15 NOTE — Telephone Encounter (Addendum)
Received notification from [facility] that Ajovy was denied.   Requirement that pt tried at least two standard prophylactic pharmacologic therapies from two different classes (ex. angiotensin receptor blocker, angiotensin converting enzyme inhibitor, anticonvulsant, beta-blocker, calcium channel blocker, tricyclic antidepressant, other antidepressant) AND has experienced at least ONE of the following: 1. Inadequate efficacy of tried prophylactic therapies 2. Adverse event severe enough to warrant discontinuation of both of these standard prophylactic therapies or 3. Inadequate efficacy to one standard prophylactic pharmacologic therapy and has experienced adverse event(s) severe enough to warrant discontinuation to another standard prophylactic pharmacologic therapy.

## 2017-05-15 NOTE — Telephone Encounter (Signed)
Called patient and LVM (ok per DPR) asking for call back to let us know if she got the Ajovy savings card in the mail. Also informed pt that Ajovy was unfortunately denied however pt should still be able to get the medication free through the end of the year. Left office number in message.

## 2017-05-19 ENCOUNTER — Other Ambulatory Visit: Payer: Self-pay | Admitting: Allergy and Immunology

## 2017-05-19 MED ORDER — AZELASTINE HCL 0.05 % OP SOLN
1.0000 [drp] | Freq: Two times a day (BID) | OPHTHALMIC | 0 refills | Status: DC
Start: 2017-05-19 — End: 2017-06-17

## 2017-05-19 NOTE — Telephone Encounter (Signed)
Patient said Dr. Nunzio CobbsBobbitt prescribed her some eyedrops a couple of years ago. She hasn't used them much, but now needs a prescription for them. Said [facility]. [facility] on [facility]-17[facility] [address] [facility] [place]

## 2017-05-19 NOTE — Telephone Encounter (Signed)
LM that refill was called in for [facility]

## 2017-05-19 NOTE — Telephone Encounter (Signed)
RF on [employer] x 1 with no refills as a courtesy. Pt needs an OV and has made an appointment

## 2017-05-29 NOTE — Telephone Encounter (Signed)
Pt returning RNs call stating she did receive the Ajovy savings card, also aware medication has been denied but can get refill throughout the year

## 2017-06-17 ENCOUNTER — Encounter: Payer: Self-pay | Admitting: Allergy and Immunology

## 2017-06-17 ENCOUNTER — Ambulatory Visit: Payer: 59 | Admitting: Allergy and Immunology

## 2017-06-17 DIAGNOSIS — J321 Chronic frontal sinusitis: Secondary | ICD-10-CM | POA: Diagnosis not present

## 2017-06-17 DIAGNOSIS — J3089 Other allergic rhinitis: Secondary | ICD-10-CM | POA: Diagnosis not present

## 2017-06-17 DIAGNOSIS — H1013 Acute atopic conjunctivitis, bilateral: Secondary | ICD-10-CM | POA: Diagnosis not present

## 2017-06-17 DIAGNOSIS — H1010 Acute atopic conjunctivitis, unspecified eye: Secondary | ICD-10-CM | POA: Insufficient documentation

## 2017-06-17 MED ORDER — IPRATROPIUM BROMIDE 0.06 % NA SOLN: 2.0000 | Freq: Three times a day (TID) | NASAL | 4 refills | Status: DC

## 2017-06-17 MED ORDER — BECLOMETHASONE DIPROPIONATE 80 MCG/ACT NA AERS: 1.0000 | INHALATION_SPRAY | Freq: Every day | NASAL | 3 refills | Status: DC

## 2017-06-17 MED ORDER — AZELASTINE HCL 0.05 % OP SOLN: 1.0000 [drp] | Freq: Two times a day (BID) | OPHTHALMIC | 1 refills | Status: DC

## 2017-06-17 MED ORDER — CARBINOXAMINE MALEATE 4 MG PO TABS: 4.0000 mg | ORAL_TABLET | ORAL | 0 refills | Status: DC | PRN

## 2017-06-17 NOTE — Assessment & Plan Note (Signed)
   Continue azelastine eyedrops twice daily if needed.

## 2017-06-17 NOTE — Progress Notes (Signed)
Follow-up Note  RE: Heather Chambers MRN: [id number] DOB: 1955/05/03 Date of Office Visit: 06/17/2017  Primary care provider: Natalia Leatherwood, DO Referring provider: Natalia Leatherwood, DO  History of present illness: Heather Chambers is a 62 y.o. 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  She was last seen in this clinic in May 2018. She reports that she has been using Qnasl and saline lavage with benefit.  She does complain of some thick postnasal drainage and has been taking Advil Cold and Sinus every night without adequate symptom relief.  She reports that she has derived great benefit from azelastine eyedrops and requests a refill prescription.  Assessment and plan: Allergic rhinitis with probable nonallergic component  Her prescription has been provided for carbinoxamine 4 mg every 6-8 hours if needed.  Continue Qnasl daily as needed.  Continue Atrovent nasal spray twice daily if needed.  Nasal saline spray (i.e., Simply Saline) or nasal saline lavage (i.e., NeilMed) is recommended as needed and prior to medicated nasal sprays.  Refill prescriptions have been provided.  Allergic conjunctivitis  Continue azelastine eyedrops twice daily if needed.   Meds ordered this encounter  Medications  . Beclomethasone Dipropionate (QNASL) 80 MCG/ACT AERS    Sig: Place 1 spray into the nose daily.    Dispense:  8.7 Inhaler    Refill:  3  . ipratropium (ATROVENT) 0.06 % nasal spray    Sig: Place 2 sprays into both nostrils 3 (three) times daily.    Dispense:  15 mL    Refill:  4  . Carbinoxamine Maleate 4 MG TABS    Sig: Take 1 tablet (4 mg total) by mouth as needed (every 6-8 hours).    Dispense:  28 each    Refill:  0  . azelastine (OPTIVAR) 0.05 % ophthalmic solution    Sig: Place 1 drop into both eyes 2 (two) times daily.    Dispense:  6 mL    Refill:  1    Physical examination: Blood pressure 110/66, pulse 69, temperature 98.4 F (36.9 C), temperature source  Oral, resp. rate 16, height  (1.6 m), weight 128 lb 12.8 oz (58.4 kg), SpO2 97 %.  General: Alert, interactive, in no acute distress. HEENT: TMs pearly gray, turbinates mildly edematous without discharge, post-pharynx mildly erythematous. Neck: Supple without lymphadenopathy. Lungs: Clear to auscultation without wheezing, rhonchi or rales. CV: Normal S1, S2 without murmurs. Skin: Warm and dry, without lesions or rashes.  The following portions of the patient's history were reviewed and updated as appropriate: allergies, current medications, past family history, past medical history, past social history, past surgical history and problem list.  Allergies as of 06/17/2017      Reactions   Augmentin [amoxicillin-pot Clavulanate] Nausea And Vomiting   Cefdinir Hives   Etodolac    "LODINE"   Phenergan [promethazine Hcl]    Restlessness, twitching      Medication List        Accurate as of 06/17/17  4:38 PM. Always use your most recent med list.          azelastine 0.05 % ophthalmic solution Commonly known as:  OPTIVAR Place 1 drop into both eyes 2 (two) times daily.   Beclomethasone Dipropionate 80 MCG/ACT Aers Commonly known as:  QNASL Place 1 spray into the nose daily.   Carbinoxamine Maleate 4 MG Tabs Take 1 tablet (4 mg total) by mouth as needed (every 6-8 hours).   Estradiol-Norethindrone Acet 0.5-0.1  MG tablet Take 1 tablet by mouth daily.   Fremanezumab-vfrm 225 MG/1.5ML Sosy Commonly known as:  AJOVY Inject 225 mg into the skin every 30 (thirty) days. Do not use with Aimovig.   ipratropium 0.06 % nasal spray Commonly known as:  ATROVENT Place 2 sprays into both nostrils 3 (three) times daily.   levothyroxine 75 MCG tablet Commonly known as:  SYNTHROID, LEVOTHROID Take 1 tablet (75 mcg total) by mouth daily.   PREMARIN vaginal cream Generic drug:  conjugated estrogens APPLY 2-3 TIMES A WEEK   SUMAtriptan 100 MG tablet Commonly known as:  IMITREX TAKE 1  TABLET ONCE. MAY REPEAT IN 2 HOURS IF HEADACHE PERSISTS OR RECURS   SUMAtriptan Succinate 11 MG/NOSEPC Exhp Commonly known as:  ONZETRA XSAIL Place 1 spray into both nostrils once for 1 dose. Administer 2 nose pieces one per nostril. At onset of migraine can repeat after 2 hours no more than 2 doses in 24 hours   valACYclovir 500 MG tablet Commonly known as:  VALTREX Take 1 tablet by mouth twice daily as needed.       Allergies  Allergen Reactions  . Augmentin [Amoxicillin-Pot Clavulanate] Nausea And Vomiting  . Cefdinir Hives  . Etodolac     "LODINE"  . Phenergan [Promethazine Hcl]     Restlessness, twitching    I appreciate the opportunity to take part in Novice's care. Please do not hesitate to contact me with questions.  Sincerely,   R. Jorene Guest, MD

## 2017-06-17 NOTE — Assessment & Plan Note (Signed)
   Her prescription has been provided for carbinoxamine 4 mg every 6-8 hours if needed.  Continue Qnasl daily as needed.  Continue Atrovent nasal spray twice daily if needed.  Nasal saline spray (i.e., Simply Saline) or nasal saline lavage (i.e., NeilMed) is recommended as needed and prior to medicated nasal sprays.  Refill prescriptions have been provided.

## 2017-06-17 NOTE — Patient Instructions (Addendum)
Allergic rhinitis with probable nonallergic component  Her prescription has been provided for carbinoxamine 4 mg every 6-8 hours if needed.  Continue Qnasl daily as needed.  Continue Atrovent nasal spray twice daily if needed.  Nasal saline spray (i.e., Simply Saline) or nasal saline lavage (i.e., NeilMed) is recommended as needed and prior to medicated nasal sprays.  Refill prescriptions have been provided.  Allergic conjunctivitis  Continue azelastine eyedrops twice daily if needed.   Return in about 1 year (around 06/18/2018), or if symptoms worsen or fail to improve.

## 2017-06-25 ENCOUNTER — Other Ambulatory Visit: Payer: Self-pay | Admitting: Women's Health

## 2017-07-16 ENCOUNTER — Encounter: Payer: Self-pay | Admitting: Women's Health

## 2017-07-16 ENCOUNTER — Ambulatory Visit (INDEPENDENT_AMBULATORY_CARE_PROVIDER_SITE_OTHER): Payer: 59 | Admitting: Women's Health

## 2017-07-16 VITALS — BP 118/78 | Ht 63.0 in | Wt 124.0 lb

## 2017-07-16 DIAGNOSIS — Z01419 Encounter for gynecological examination (general) (routine) without abnormal findings: Secondary | ICD-10-CM | POA: Diagnosis not present

## 2017-07-16 DIAGNOSIS — B009 Herpesviral infection, unspecified: Secondary | ICD-10-CM

## 2017-07-16 MED ORDER — ESTRADIOL-NORETHINDRONE ACET 0.5-0.1 MG PO TABS: 1.0000 | ORAL_TABLET | Freq: Every day | ORAL | 4 refills | Status: DC

## 2017-07-16 MED ORDER — PREMARIN 0.625 MG/GM VA CREA: TOPICAL_CREAM | VAGINAL | 6 refills | Status: DC

## 2017-07-16 MED ORDER — VALACYCLOVIR HCL 500 MG PO TABS: ORAL_TABLET | ORAL | 6 refills | Status: DC

## 2017-07-16 NOTE — Progress Notes (Signed)
Heather Chambers January 16, 1956 [id number]    History:    Presents for annual exam.  Postmenopausal on Activella with no bleeding, also on vaginal Premarin.  Continues with some hot flashes but states can tolerate.  Normal Pap and mammogram history.  2011- colonoscopy.  2019 DEXA T score -1.6 at right femoral neck, left hip normal, spine -2.1, FRAX 7.5/0.7%.  Hypothyroid primary care manages labs and meds.  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  History of migraines currently on a new injectable medication which is helping.  Past medical history, past surgical history, family history and social history were all reviewed and documented in the EPIC chart.  Works at [employer].  [place]  ROS:  A ROS was performed and pertinent positives and negatives are included.  Exam:  Vitals:   07/16/17 0907  BP: 118/78  Weight: 124 lb (56.2 kg)  Height: 5\' 3"  (1.6 m)   Body mass index is 21.97 kg/m.   General appearance:  Normal Thyroid:  Symmetrical, normal in size, without palpable masses or nodularity. Respiratory  Auscultation:  Clear without wheezing or rhonchi Cardiovascular  Auscultation:  Regular rate, without rubs, murmurs or gallops  Edema/varicosities:  Not grossly evident Abdominal  Soft,nontender, without masses, guarding or rebound.  Liver/spleen:  No organomegaly noted  Hernia:  None appreciated  Skin  Inspection:  Grossly normal   Breasts: Examined lying and sitting.     Right: Without masses, retractions, discharge or axillary adenopathy.     Left: Without masses, retractions, discharge or axillary adenopathy. Gentitourinary   Inguinal/mons:  Normal without inguinal adenopathy  External genitalia:  Normal  BUS/Urethra/Skene's glands:  Normal  Vagina:  Normal  Cervix:  Normal  Uterus:   normal in size, shape and contour.  Midline and mobile  Adnexa/parametria:     Rt: Without masses or tenderness.   Lt: Without masses or tenderness.  Anus and  perineum: Normal  Digital rectal exam: Normal sphincter tone without palpated masses or tenderness  Assessment/Plan:  62 y.o. MWF G1, P1 for annual exam with no complaints.  Postmenopausal on HRT with no bleeding Osteopenia without elevated FRAX Hypothyroidism-primary care manages labs and meds Migraines- neurologist  Plan: Activella 0.5/0.1, proper use given and reviewed risk for blood clots, strokes and breast cancer.  Reviewed importance of using shortest amount of time women's health initiative reviewed states does not feel well when off.  Premarin vaginal cream half an applicator twice weekly vaginally prescription, proper use given and reviewed.  SBE's, annual screening mammogram, calcium rich foods, vitamin D 2000 daily encouraged.  Reviewed importance of weightbearing exercise as able had foot surgery last year.  Safety, fall prevention discussed.  Pap normal 2017, new screening guidelines reviewed.  Shingrex discussed will get at primary care.    Harrington Challengerancy J Pleas [facility]WHNP, 9:50 AM 07/16/2017

## 2017-07-16 NOTE — Patient Instructions (Signed)
Preventing Osteoporosis, Adult Osteoporosis is a condition that causes the bones to get weaker. With osteoporosis, the bones become thinner, and the normal spaces in bone tissue become larger. This can make the bones weak and cause them to break more easily. People who have osteoporosis are more likely to break their wrist, spine, or hip. Even a minor accident or injury can be enough to break weak bones. Osteoporosis can occur with aging. Your body constantly replaces old bone tissue with new tissue. As you get older, you may lose bone tissue more quickly, or it may be replaced more slowly. Osteoporosis is more likely to develop if you have poor nutrition or do not get enough calcium or vitamin D. Other lifestyle factors can also play a role. By making some diet and lifestyle changes, you can help to keep your bones healthy and help to prevent osteoporosis. What nutrition changes can be made? Nutrition plays an important role in maintaining healthy, strong bones.  Make sure you get enough calcium every day from food or from calcium supplements. ? If you are age 50 or younger, aim to get 1,000 mg of calcium every day. ? If you are older than age 50, aim to get 1,200 mg of calcium every day.  Try to get enough vitamin D every day. ? If you are age 70 or younger, aim to get 600 international units (IU) every day. ? If you are older than age 70, aim to get 800 international units every day.  Follow a healthy diet. Eat plenty of foods that contain calcium and vitamin D. ? Calcium is in milk, cheese, yogurt, and other dairy products. Some fish and vegetables are also good sources of calcium. Many foods such as cereals and breads have had calcium added to them (are fortified). Check nutrition labels to see how much calcium is in a food or drink. ? Foods that contain vitamin D include milk, cereals, salmon, and tuna. Your body also makes vitamin D when you are out in the sun. Bare skin exposure to the sun on  your face, arms, legs, or back for no more than 30 minutes a day, 2 times per week is more than enough. Beyond that, it is important to use sunblock to protect your skin from sunburn, which increases your risk for skin cancer.  What lifestyle changes can be made? Making changes in your everyday life can also play an important role in preventing osteoporosis.  Stay active and get exercise every day. Ask your health care provider what types of exercise are best for you.  Do not use any products that contain nicotine or tobacco, such as cigarettes and e-cigarettes. If you need help quitting, ask your health care provider.  Limit alcohol intake to no more than 1 drink a day for nonpregnant women and 2 drinks a day for men. One drink equals 12 oz of beer, 5 oz of wine, or 1 oz of hard liquor.  Why are these changes important? Making these nutrition and lifestyle changes can:  Help you develop and maintain healthy, strong bones.  Prevent loss of bone mass and the problems that are caused by that loss, such as broken bones and delayed healing.  Make you feel better mentally and physically.  What can happen if changes are not made? Problems that can result from osteoporosis can be very serious. These may include:  A higher risk of broken bones that are painful and do not heal well.  Physical malformations, such as   a collapsed spine or a hunched back.  Problems with movement.  Where to find support: If you need help making changes to prevent osteoporosis, talk with your health care provider. You can ask for a referral to a diet and nutrition specialist (dietitian) and a physical therapist. Where to find more information: Learn more about osteoporosis from:  NIH Osteoporosis and Related Bone Diseases National Resource Center: www.niams.nih.gov/health_info/bone/osteoporosis/osteoporosis_ff.asp  U.S. Office on Women's Health:  www.womenshealth.gov/publications/our-publications/fact-sheet/osteoporosis.html  National Osteoporosis Foundation: www.nof.org/patients/what-is-osteoporosis/  Summary  Osteoporosis is a condition that causes weak bones that are more likely to break.  Eating a healthy diet and making sure you get enough calcium and vitamin D can help prevent osteoporosis.  Other ways to reduce your risk of osteoporosis include getting regular exercise and avoiding alcohol and products that contain nicotine or tobacco. This information is not intended to replace advice given to you by your health care provider. Make sure you discuss any questions you have with your health care provider. Document Released: 02/12/2015 Document Revised: 10/09/2015 Document Reviewed: 10/09/2015 Elsevier Interactive Patient Education  2018 [employer]  

## 2017-07-18 LAB — CULTURE INDICATED

## 2017-07-18 LAB — URINALYSIS, COMPLETE W/RFL CULTURE
BACTERIA UA: NONE SEEN /HPF
BILIRUBIN URINE: NEGATIVE
GLUCOSE, UA: NEGATIVE
HGB URINE DIPSTICK: NEGATIVE
Hyaline Cast: NONE SEEN /LPF
KETONES UR: NEGATIVE
LEUKOCYTE ESTERASE: NEGATIVE
NITRITES URINE, INITIAL: NEGATIVE
PH: 7.5 (ref 5.0–8.0)
Protein, ur: NEGATIVE
SPECIFIC GRAVITY, URINE: 1.012 (ref 1.001–1.03)
WBC, UA: NONE SEEN /HPF (ref 0–5)

## 2017-07-18 LAB — URINE CULTURE
MICRO NUMBER: 90681030
SPECIMEN QUALITY:: ADEQUATE

## 2017-08-05 NOTE — Telephone Encounter (Signed)
Use copay cards

## 2017-08-24 ENCOUNTER — Other Ambulatory Visit: Payer: Self-pay | Admitting: Allergy and Immunology

## 2017-08-24 DIAGNOSIS — J321 Chronic frontal sinusitis: Secondary | ICD-10-CM

## 2017-08-24 DIAGNOSIS — J3089 Other allergic rhinitis: Secondary | ICD-10-CM

## 2017-09-17 ENCOUNTER — Other Ambulatory Visit: Payer: Self-pay | Admitting: Women's Health

## 2017-09-17 DIAGNOSIS — Z1231 Encounter for screening mammogram for malignant neoplasm of breast: Secondary | ICD-10-CM

## 2017-10-14 ENCOUNTER — Ambulatory Visit
Admission: RE | Admit: 2017-10-14 | Discharge: 2017-10-14 | Disposition: A | Discharge status: Home / Self Care | Payer: 59 | Source: Ambulatory Visit | Attending: Women's Health | Admitting: Women's Health

## 2017-10-14 DIAGNOSIS — Z1231 Encounter for screening mammogram for malignant neoplasm of breast: Secondary | ICD-10-CM | POA: Diagnosis not present

## 2017-10-15 ENCOUNTER — Other Ambulatory Visit: Payer: Self-pay | Admitting: Women's Health

## 2017-10-15 DIAGNOSIS — R928 Other abnormal and inconclusive findings on diagnostic imaging of breast: Secondary | ICD-10-CM

## 2017-10-23 ENCOUNTER — Ambulatory Visit
Admission: RE | Admit: 2017-10-23 | Discharge: 2017-10-23 | Disposition: A | Discharge status: Home / Self Care | Payer: 59 | Source: Ambulatory Visit | Attending: Women's Health | Admitting: Women's Health

## 2017-10-23 DIAGNOSIS — R922 Inconclusive mammogram: Secondary | ICD-10-CM | POA: Diagnosis not present

## 2017-10-23 DIAGNOSIS — N6012 Diffuse cystic mastopathy of left breast: Secondary | ICD-10-CM | POA: Diagnosis not present

## 2017-10-23 DIAGNOSIS — R928 Other abnormal and inconclusive findings on diagnostic imaging of breast: Secondary | ICD-10-CM

## 2017-11-07 ENCOUNTER — Ambulatory Visit (INDEPENDENT_AMBULATORY_CARE_PROVIDER_SITE_OTHER): Payer: 59

## 2017-11-07 DIAGNOSIS — Z23 Encounter for immunization: Secondary | ICD-10-CM

## 2018-01-03 ENCOUNTER — Other Ambulatory Visit: Payer: Self-pay | Admitting: Family Medicine

## 2018-01-14 ENCOUNTER — Other Ambulatory Visit: Payer: Self-pay | Admitting: Allergy and Immunology

## 2018-01-14 DIAGNOSIS — J3089 Other allergic rhinitis: Secondary | ICD-10-CM

## 2018-01-14 DIAGNOSIS — J321 Chronic frontal sinusitis: Secondary | ICD-10-CM

## 2018-01-23 ENCOUNTER — Ambulatory Visit (INDEPENDENT_AMBULATORY_CARE_PROVIDER_SITE_OTHER): Payer: 59 | Admitting: Family Medicine

## 2018-01-23 ENCOUNTER — Other Ambulatory Visit: Payer: Self-pay

## 2018-01-23 ENCOUNTER — Encounter: Payer: Self-pay | Admitting: Family Medicine

## 2018-01-23 VITALS — BP 118/80 | HR 69 | Temp 98.1°F | Resp 16 | Ht 63.0 in | Wt 127.0 lb

## 2018-01-23 DIAGNOSIS — Z131 Encounter for screening for diabetes mellitus: Secondary | ICD-10-CM | POA: Diagnosis not present

## 2018-01-23 DIAGNOSIS — Z0000 Encounter for general adult medical examination without abnormal findings: Secondary | ICD-10-CM | POA: Diagnosis not present

## 2018-01-23 DIAGNOSIS — B009 Herpesviral infection, unspecified: Secondary | ICD-10-CM

## 2018-01-23 DIAGNOSIS — Z130 Encounter for screening for diseases of the blood and blood-forming organs and certain disorders involving the immune mechanism: Secondary | ICD-10-CM

## 2018-01-23 DIAGNOSIS — Z79899 Other long term (current) drug therapy: Secondary | ICD-10-CM

## 2018-01-23 DIAGNOSIS — G43701 Chronic migraine without aura, not intractable, with status migrainosus: Secondary | ICD-10-CM

## 2018-01-23 DIAGNOSIS — J3089 Other allergic rhinitis: Secondary | ICD-10-CM

## 2018-01-23 DIAGNOSIS — E038 Other specified hypothyroidism: Secondary | ICD-10-CM | POA: Diagnosis not present

## 2018-01-23 LAB — COMPREHENSIVE METABOLIC PANEL
ALBUMIN: 4.7 g/dL (ref 3.5–5.2)
ALT: 12 U/L (ref 0–35)
AST: 18 U/L (ref 0–37)
Alkaline Phosphatase: 39 U/L (ref 39–117)
BUN: 14 mg/dL (ref 6–23)
CALCIUM: 9.2 mg/dL (ref 8.4–10.5)
CHLORIDE: 98 meq/L (ref 96–112)
CO2: 25 mEq/L (ref 19–32)
CREATININE: 0.69 mg/dL (ref 0.40–1.20)
GFR: 92.14 mL/min (ref >60.00)
Glucose, Bld: 78 mg/dL (ref 70–99)
POTASSIUM: 3.8 meq/L (ref 3.5–5.1)
Sodium: 133 mEq/L — ABNORMAL LOW (ref 135–145)
Total Bilirubin: 0.5 mg/dL (ref 0.2–1.2)
Total Protein: 7 g/dL (ref 6.0–8.3)

## 2018-01-23 LAB — CBC
HCT: 40 % (ref 36.0–46.0)
Hemoglobin: 13.3 g/dL (ref 12.0–15.0)
MCHC: 33.2 g/dL (ref 30.0–36.0)
MCV: 91.7 fl (ref 78.0–100.0)
PLATELETS: 333 10*3/uL (ref 150.0–400.0)
RBC: 4.36 Mil/uL (ref 3.87–5.11)
RDW: 14.1 % (ref 11.5–15.5)
WBC: 7.7 10*3/uL (ref 4.0–10.5)

## 2018-01-23 LAB — TSH: TSH: 1.26 u[IU]/mL (ref 0.35–4.50)

## 2018-01-23 LAB — CHOLESTEROL, TOTAL: Cholesterol: 191 mg/dL (ref 0–200)

## 2018-01-23 LAB — HEMOGLOBIN A1C: HEMOGLOBIN A1C: 5.1 % (ref 4.6–6.5)

## 2018-01-23 MED ORDER — BECLOMETHASONE DIPROPIONATE 80 MCG/ACT NA AERS: 1.0000 | INHALATION_SPRAY | Freq: Every day | NASAL | 3 refills | Status: DC

## 2018-01-23 MED ORDER — SUMATRIPTAN SUCCINATE 100 MG PO TABS: ORAL_TABLET | ORAL | 4 refills | Status: DC

## 2018-01-23 MED ORDER — VALACYCLOVIR HCL 500 MG PO TABS: ORAL_TABLET | ORAL | 6 refills | Status: DC

## 2018-01-23 NOTE — Telephone Encounter (Signed)
Patient requested that Qnasal be sent to [facility] instead of [facility] due to cost. Medication sent

## 2018-01-23 NOTE — Progress Notes (Signed)
Patient ID: Heather Chambers, female  DOB: 08-Aug-1955, 61 y.o.   MRN: [id number] Patient Care Team    Relationship Specialty Notifications Start End  Ma Hillock, DO PCP - General Family Medicine  01/09/15   Huel Cote, NP Nurse Practitioner Obstetrics and Gynecology  01/18/16   Milus Banister, MD Attending Physician Gastroenterology  01/18/16   Bobbitt, [place] Muta, MD Consulting Physician Allergy and Immunology  01/22/17     Chief Complaint  Patient presents with  . Annual Exam    pt is not fasting    Subjective:  Heather Chambers is a 62 y.o.  Female  present for CPE . All past medical history, surgical history, allergies, family history, immunizations, medications and social history were updated in the electronic medical record today. All recent labs, ED visits and hospitalizations within the last year were reviewed.  Health maintenance: updated 01/23/18 Colonoscopy: 10 year, No polys no FHx. 2011. Dr. Ardis Hughs Mammogram: GYN, 10/2017,recent abnormal with further images ok.  FHX of breast cancer.  Cervical cancer screening: 02/2015 UTD, normal. 3 year f/u, has gyn.  Immunizations: UTD flu 2019 and tdap 02/2009. PNA series completed rpt > 65. Shingrix wants to wait until after holidays. Infectious disease screening: HIV and Hep C completed DEXA: Osteopenia, DEXA 2-3 years ago. Gyn completes Assistive device: none Oxygen YIA:XKPV Patient has a Dental home. Hospitalizations/ED visits: reviewed  Depression screen [facility] 2/9 01/23/2018 01/22/2017 01/18/2016 05/31/2014  Decreased Interest 0 0 0 0  Down, Depressed, Hopeless 0 0 0 0  PHQ - 2 Score 0 0 0 0   No flowsheet data found.  Immunization History  Administered Date(s) Administered  . Influenza Whole 11/20/2007, 11/30/2009  . Influenza,inj,Quad PF,6+ Mos 11/07/2017  . Influenza-Unspecified 10/31/2014, 10/29/2015, 10/31/2016  . Pneumococcal Conjugate-13 07/19/2015  . Pneumococcal Polysaccharide-23 02/24/2009  . Td  02/24/2009    Past Medical History:  Diagnosis Date  . Allergy   . Arthritis   . Chronic pelvic pain in female 1995   LLQ  . Chronic rhinitis   . Health maintenance examination   . Hypothyroidism   . Menopausal symptoms   . Migraine   . Osteopenia 03/2017   T score -2.1 FRAX 7.5% / 0.7%   Allergies  Allergen Reactions  . Augmentin [Amoxicillin-Pot Clavulanate] Nausea And Vomiting  . Cefdinir Hives  . Etodolac     "LODINE"  . Phenergan [Promethazine Hcl]     Restlessness, twitching   Past Surgical History:  Procedure Laterality Date  . FOOT SURGERY Right 05/31/2016   joint replacement  . PELVIC LAPAROSCOPY     DIAG LAP   Family History  Problem Relation Age of Onset  . Allergic rhinitis Mother   . Migraines Mother   . Osteoarthritis Mother   . Breast cancer Maternal Grandmother   . Allergic rhinitis Maternal Grandmother   . Heart disease Maternal Grandfather   . Breast cancer Sister   . Angioedema Neg Hx   . Asthma Neg Hx   . Atopy Neg Hx   . Eczema Neg Hx   . Immunodeficiency Neg Hx   . Urticaria Neg Hx    Social History   Socioeconomic History  . Marital status: Married    Spouse name: Not on file  . Number of children: 1  . Years of education: Not on file  . Highest education level: Master's degree (e.g., MA, MS, MEng, MEd, MSW, MBA)  Occupational History    Employer: [employer]  Social Needs  . Financial resource strain: Not on file  . Food insecurity:    Worry: Not on file    Inability: Not on file  . Transportation needs:    Medical: Not on file    Non-medical: Not on file  Tobacco Use  . Smoking status: Never Smoker  . Smokeless tobacco: Never Used  Substance and Sexual Activity  . Alcohol use: Yes    Alcohol/week: 4.0 standard drinks    Types: 4 Cans of beer per week    Comment: wine and beer  . Drug use: No  . Sexual activity: Yes    Birth control/protection: Post-menopausal  Lifestyle  . Physical activity:     Days per week: Not on file    Minutes per session: Not on file  . Stress: Not on file  Relationships  . Social connections:    Talks on phone: Not on file    Gets together: Not on file    Attends religious service: Not on file    Active member of club or organization: Not on file    Attends meetings of clubs or organizations: Not on file    Relationship status: Not on file  . Intimate partner violence:    Fear of current or ex partner: Not on file    Emotionally abused: Not on file    Physically abused: Not on file    Forced sexual activity: Not on file  Other Topics Concern  . Not on file  Social History Narrative   Ms. Crossley lives with her husband. She has a grown son who recently moved to [place] for a job. 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    Wears seatbelt.    Exercises 3x a week.    Smoke alarm in the home.    Feels safe in relationships   Right handed   Drinks decaf tea and coffee   Allergies as of 01/23/2018      Reactions   Augmentin [amoxicillin-pot Clavulanate] Nausea And Vomiting   Cefdinir Hives   Etodolac    "LODINE"   Phenergan [promethazine Hcl]    Restlessness, twitching      Medication List       Accurate as of January 23, 2018  1:33 PM. Always use your most recent med list.        Beclomethasone Dipropionate 80 MCG/ACT Aers Commonly known as:  QNASL Place 1 spray into the nose daily.   Carbinoxamine Maleate 4 MG Tabs Take 1 tablet (4 mg total) by mouth as needed (every 6-8 hours).   Fremanezumab-vfrm 225 MG/1.5ML Sosy Commonly known as:  AJOVY Inject 225 mg into the skin every 30 (thirty) days. Do not use with Aimovig.   ipratropium 0.06 % nasal spray Commonly known as:  ATROVENT PLACE 2 SPRAYS INTO BOTH NOSTRILS 3 (THREE) TIMES DAILY.   PREMARIN vaginal cream Generic drug:  conjugated estrogens Use small amt 1/2 applicator 3 times week   SUMAtriptan 100 MG tablet Commonly known as:   IMITREX TAKE 1 TABLET ONCE. MAY REPEAT IN 2 HOURS IF HEADACHE PERSISTS OR RECURS   SYNTHROID 75 MCG tablet Generic drug:  levothyroxine TAKE 1 TABLET DAILY   valACYclovir 500 MG tablet Commonly known as:  VALTREX Take 1 tablet by mouth twice daily as needed.       All past medical history, surgical history, allergies, family history, immunizations andmedications were updated in the  EMR today and reviewed under the history and medication portions of their EMR.     No results found for this or any previous visit (from the past 2160 hour(s)).  US Breast Ltd Uni Left Inc Axilla  Result Date: 10/23/2017 CLINICAL DATA:  62 year old patient recalled from recent screening mammogram for evaluation possible asymmetry in the left breast. EXAM: DIGITAL DIAGNOSTIC LEFT MAMMOGRAM WITH CAD AND TOMO ULTRASOUND LEFT BREAST COMPARISON:  October 14, 2017 ACR Breast Density Category c: The breast tissue is heterogeneously dense, which may obscure small masses. FINDINGS: Focal spot compression views of the medial left breast show dense breast parenchyma without distortion. There may be an obscured mass or masses with partially circumscribed margins on image 43 of 61. Mammographic images were processed with CAD. Targeted ultrasound is performed, showing 2 clusters of cysts in the lower inner quadrant of the left breast. At 8 o'clock position 6 cm from the nipple is a 0.7 cm cluster of cysts with no associated vascular flow. At 7:30 position 4 cm from the nipple is a smaller cluster of cysts measuring 0.5 cm. No suspicious or solid mass is identified in the medial left breast. IMPRESSION: Two benign clusters of cysts in the 7:30 and 8 o'clock positions of the left breast. No evidence of malignancy. RECOMMENDATION: Screening mammogram in one year.(Code:SM-B-01Y) I have discussed the findings and recommendations with the patient. Results were also provided in writing at the conclusion of the visit. If applicable, a  reminder letter will be sent to the patient regarding the next appointment. BI-RADS CATEGORY  2: Benign. Electronically Signed   By: Curlene Dolphin M.D.   On: 10/23/2017 11:51   Mm Diag Breast Tomo Uni Left  Result Date: 10/23/2017 CLINICAL DATA:  62 year old patient recalled from recent screening mammogram for evaluation possible asymmetry in the left breast. EXAM: DIGITAL DIAGNOSTIC LEFT MAMMOGRAM WITH CAD AND TOMO ULTRASOUND LEFT BREAST COMPARISON:  October 14, 2017 ACR Breast Density Category c: The breast tissue is heterogeneously dense, which may obscure small masses. FINDINGS: Focal spot compression views of the medial left breast show dense breast parenchyma without distortion. There may be an obscured mass or masses with partially circumscribed margins on image 43 of 61. Mammographic images were processed with CAD. Targeted ultrasound is performed, showing 2 clusters of cysts in the lower inner quadrant of the left breast. At 8 o'clock position 6 cm from the nipple is a 0.7 cm cluster of cysts with no associated vascular flow. At 7:30 position 4 cm from the nipple is a smaller cluster of cysts measuring 0.5 cm. No suspicious or solid mass is identified in the medial left breast. IMPRESSION: Two benign clusters of cysts in the 7:30 and 8 o'clock positions of the left breast. No evidence of malignancy. RECOMMENDATION: Screening mammogram in one year.(Code:SM-B-01Y) I have discussed the findings and recommendations with the patient. Results were also provided in writing at the conclusion of the visit. If applicable, a reminder letter will be sent to the patient regarding the next appointment. BI-RADS CATEGORY  2: Benign. Electronically Signed   By: Curlene Dolphin M.D.   On: 10/23/2017 11:51     ROS: 14 pt review of systems performed and negative (unless mentioned in an HPI)  Objective: BP 118/80 (BP Location: Left Arm, Patient Position: Sitting, Cuff Size: Normal)   Pulse 69   Temp 98.1 F (36.7  C) (Oral)   Resp 16   Ht '5\' 3"'  (1.6 m)   Wt 127 lb (57.6  kg)   SpO2 100%   BMI 22.50 kg/m  Gen: Afebrile. No acute distress. Nontoxic in appearance, well-developed, well-nourished,  Pleasant caucasian female.  HENT: AT. [place]. Bilateral TM visualized and normal in appearance, normal external auditory canal. MMM, no oral lesions, adequate dentition. Bilateral nares within normal limits. Throat without erythema, ulcerations or exudates. no Cough on exam, no hoarseness on exam. Eyes:Pupils Equal Round Reactive to light, Extraocular movements intact,  Conjunctiva without redness, discharge or icterus. Neck/lymp/endocrine: Supple,no lymphadenopathy, no thyromegaly CV: RRR no murmur, no edema, +2/4 P posterior tibialis pulses. no carotid bruits. No JVD. Chest: CTAB, no wheeze, rhonchi or crackles. normal Respiratory effort. good Air movement. Abd: Soft. flat. NTND. BS present. no Masses palpated. No hepatosplenomegaly. No rebound tenderness or guarding. Skin: no rashes, purpura or petechiae. Warm and well-perfused. Skin intact. Neuro/Msk:  Normal gait. PERLA. EOMi. Alert. Oriented x3.  Cranial nerves II through XII intact. Muscle strength 5/5 upper/lower extremity. DTRs equal bilaterally. Psych: Normal affect, dress and demeanor. Normal speech. Normal thought content and judgment.   No exam data present  Assessment/plan: JOLETTE LANA is a 62 y.o. female present for CPE. hypothyroidism Compliance with levothyroxine. Refills provided after results.  - TSH - Cholesterol, Total Chronic migraine:  - stable.  - seeing neurology for meds.  Martin Majestic did well- but caused constipation.  - refilled imitrex.  - SUMAtriptan (IMITREX) 100 MG tablet; TAKE 1 TABLET ONCE. MAY REPEAT IN 2 HOURS IF HEADACHE PERSISTS OR RECURS  Dispense: 18 tablet; Refill: 4 Allergic rhinitis:  - Qnasl refilled for her, if prior Josem Kaufmann is needed she will need to call ENT- since they supplied and is able to fill out prior auth  (meds tried etc).  Diabetes mellitus screening - HgB A1c Encounter for long-term current use of medication - Comp Met (CMET) Screening for deficiency anemia - CBC Preventive health exam encounter:  Patient was encouraged to exercise greater than 150 minutes a week. Patient was encouraged to choose a diet filled with fresh fruits and vegetables, and lean meats. AVS provided to patient today for education/recommendation on gender specific health and safety maintenance. Colonoscopy: 10 year, No polys no FHx. 2011. Dr. Ardis Hughs Mammogram: GYN, 10/2017,recent abnormal with further images ok.  FHX of breast cancer.  Cervical cancer screening: 02/2015 UTD, normal. 3 year f/u, has gyn.  Immunizations: UTD flu 2019 and tdap 02/2009. PNA series completed rpt > 65. Shingrix wants to wait until after holidays. Infectious disease screening: HIV and Hep C completed DEXA: Osteopenia, DEXA 2-3 years ago. Gyn completes   Return in about 1 year (around 01/24/2019) for CPE.  Electronically signed by: Howard Pouch, DO [place]

## 2018-01-23 NOTE — Patient Instructions (Addendum)
Nurse visit after the holidays if you want shingle shots  Health Maintenance, Female Adopting a healthy lifestyle and getting preventive care can go a long way to promote health and wellness. Talk with your health care provider about what schedule of regular examinations is right for you. This is a good chance for you to check in with your provider about disease prevention and staying healthy. In between checkups, there are plenty of things you can do on your own. Experts have done a lot of research about which lifestyle changes and preventive measures are most likely to keep you healthy. Ask your health care provider for more information. Weight and diet Eat a healthy diet  Be sure to include plenty of vegetables, fruits, low-fat dairy products, and lean protein.  Do not eat a lot of foods high in solid fats, added sugars, or salt.  Get regular exercise. This is one of the most important things you can do for your health. ? Most adults should exercise for at least 150 minutes each week. The exercise should increase your heart rate and make you sweat (moderate-intensity exercise). ? Most adults should also do strengthening exercises at least twice a week. This is in addition to the moderate-intensity exercise.  Maintain a healthy weight  Body mass index (BMI) is a measurement that can be used to identify possible weight problems. It estimates body fat based on height and weight. Your health care provider can help determine your BMI and help you achieve or maintain a healthy weight.  For females 69 years of age and older: ? A BMI below 18.5 is considered underweight. ? A BMI of 18.5 to 24.9 is normal. ? A BMI of 25 to 29.9 is considered overweight. ? A BMI of 30 and above is considered obese.  Watch levels of cholesterol and blood lipids  You should start having your blood tested for lipids and cholesterol at 60 years of age, then have this test every 5 years.  You may need to have your  cholesterol levels checked more often if: ? Your lipid or cholesterol levels are high. ? You are older than 60 years of age. ? You are at high risk for heart disease.  Cancer screening Lung Cancer  Lung cancer screening is recommended for adults 22-50 years old who are at high risk for lung cancer because of a history of smoking.  A yearly low-dose CT scan of the lungs is recommended for people who: ? Currently smoke. ? Have quit within the past 15 years. ? Have at least a 30-pack-year history of smoking. A pack year is smoking an average of one pack of cigarettes a day for 1 year.  Yearly screening should continue until it has been 15 years since you quit.  Yearly screening should stop if you develop a health problem that would prevent you from having lung cancer treatment.  Breast Cancer  Practice breast self-awareness. This means understanding how your breasts normally appear and feel.  It also means doing regular breast self-exams. Let your health care provider know about any changes, no matter how small.  If you are in your 20s or 30s, you should have a clinical breast exam (CBE) by a health care provider every 1-3 years as part of a regular health exam.  If you are 89 or older, have a CBE every year. Also consider having a breast X-ray (mammogram) every year.  If you have a family history of breast cancer, talk to your health care  provider about genetic screening.  If you are at high risk for breast cancer, talk to your health care provider about having an MRI and a mammogram every year.  Breast cancer gene (BRCA) assessment is recommended for women who have family members with BRCA-related cancers. BRCA-related cancers include: ? Breast. ? Ovarian. ? Tubal. ? Peritoneal cancers.  Results of the assessment will determine the need for genetic counseling and BRCA1 and BRCA2 testing.  Cervical Cancer Your health care provider may recommend that you be screened regularly  for cancer of the pelvic organs (ovaries, uterus, and vagina). This screening involves a pelvic examination, including checking for microscopic changes to the surface of your cervix (Pap test). You may be encouraged to have this screening done every 3 years, beginning at age 21.  For women ages 30-65, health care providers may recommend pelvic exams and Pap testing every 3 years, or they may recommend the Pap and pelvic exam, combined with testing for human papilloma virus (HPV), every 5 years. Some types of HPV increase your risk of cervical cancer. Testing for HPV may also be done on women of any age with unclear Pap test results.  Other health care providers may not recommend any screening for nonpregnant women who are considered low risk for pelvic cancer and who do not have symptoms. Ask your health care provider if a screening pelvic exam is right for you.  If you have had past treatment for cervical cancer or a condition that could lead to cancer, you need Pap tests and screening for cancer for at least 20 years after your treatment. If Pap tests have been discontinued, your risk factors (such as having a new sexual partner) need to be reassessed to determine if screening should resume. Some women have medical problems that increase the chance of getting cervical cancer. In these cases, your health care provider may recommend more frequent screening and Pap tests.  Colorectal Cancer  This type of cancer can be detected and often prevented.  Routine colorectal cancer screening usually begins at 60 years of age and continues through 60 years of age.  Your health care provider may recommend screening at an earlier age if you have risk factors for colon cancer.  Your health care provider may also recommend using home test kits to check for hidden blood in the stool.  A small camera at the end of a tube can be used to examine your colon directly (sigmoidoscopy or colonoscopy). This is done to  check for the earliest forms of colorectal cancer.  Routine screening usually begins at age 50.  Direct examination of the colon should be repeated every 5-10 years through 60 years of age. However, you may need to be screened more often if early forms of precancerous polyps or small growths are found.  Skin Cancer  Check your skin from head to toe regularly.  Tell your health care provider about any new moles or changes in moles, especially if there is a change in a mole's shape or color.  Also tell your health care provider if you have a mole that is larger than the size of a pencil eraser.  Always use sunscreen. Apply sunscreen liberally and repeatedly throughout the day.  Protect yourself by wearing long sleeves, pants, a wide-brimmed hat, and sunglasses whenever you are outside.  Heart disease, diabetes, and high blood pressure  High blood pressure causes heart disease and increases the risk of stroke. High blood pressure is more likely to develop in: ?   People who have blood pressure in the high end of the normal range (130-139/85-89 mm Hg). ? People who are overweight or obese. ? People who are African American.  If you are 18-39 years of age, have your blood pressure checked every 3-5 years. If you are 40 years of age or older, have your blood pressure checked every year. You should have your blood pressure measured twice-once when you are at a hospital or clinic, and once when you are not at a hospital or clinic. Record the average of the two measurements. To check your blood pressure when you are not at a hospital or clinic, you can use: ? An automated blood pressure machine at a pharmacy. ? A home blood pressure monitor.  If you are between 55 years and 79 years old, ask your health care provider if you should take aspirin to prevent strokes.  Have regular diabetes screenings. This involves taking a blood sample to check your fasting blood sugar level. ? If you are at a  normal weight and have a low risk for diabetes, have this test once every three years after 60 years of age. ? If you are overweight and have a high risk for diabetes, consider being tested at a younger age or more often. Preventing infection Hepatitis B  If you have a higher risk for hepatitis B, you should be screened for this virus. You are considered at high risk for hepatitis B if: ? You were born in a country where hepatitis B is common. Ask your health care provider which countries are considered high risk. ? Your parents were born in a high-risk country, and you have not been immunized against hepatitis B (hepatitis B vaccine). ? You have HIV or AIDS. ? You use needles to inject street drugs. ? You live with someone who has hepatitis B. ? You have had sex with someone who has hepatitis B. ? You get hemodialysis treatment. ? You take certain medicines for conditions, including cancer, organ transplantation, and autoimmune conditions.  Hepatitis C  Blood testing is recommended for: ? Everyone born from 1945 through 1965. ? Anyone with known risk factors for hepatitis C.  Sexually transmitted infections (STIs)  You should be screened for sexually transmitted infections (STIs) including gonorrhea and chlamydia if: ? You are sexually active and are younger than 60 years of age. ? You are older than 60 years of age and your health care provider tells you that you are at risk for this type of infection. ? Your sexual activity has changed since you were last screened and you are at an increased risk for chlamydia or gonorrhea. Ask your health care provider if you are at risk.  If you do not have HIV, but are at risk, it may be recommended that you take a prescription medicine daily to prevent HIV infection. This is called pre-exposure prophylaxis (PrEP). You are considered at risk if: ? You are sexually active and do not regularly use condoms or know the HIV status of your  partner(s). ? You take drugs by injection. ? You are sexually active with a partner who has HIV.  Talk with your health care provider about whether you are at high risk of being infected with HIV. If you choose to begin PrEP, you should first be tested for HIV. You should then be tested every 3 months for as long as you are taking PrEP. Pregnancy  If you are premenopausal and you may become pregnant, ask your health   care provider about preconception counseling.  If you may become pregnant, take 400 to 800 micrograms (mcg) of folic acid every day.  If you want to prevent pregnancy, talk to your health care provider about birth control (contraception). Osteoporosis and menopause  Osteoporosis is a disease in which the bones lose minerals and strength with aging. This can result in serious bone fractures. Your risk for osteoporosis can be identified using a bone density scan.  If you are 65 years of age or older, or if you are at risk for osteoporosis and fractures, ask your health care provider if you should be screened.  Ask your health care provider whether you should take a calcium or vitamin D supplement to lower your risk for osteoporosis.  Menopause may have certain physical symptoms and risks.  Hormone replacement therapy may reduce some of these symptoms and risks. Talk to your health care provider about whether hormone replacement therapy is right for you. Follow these instructions at home:  Schedule regular health, dental, and eye exams.  Stay current with your immunizations.  Do not use any tobacco products including cigarettes, chewing tobacco, or electronic cigarettes.  If you are pregnant, do not drink alcohol.  If you are breastfeeding, limit how much and how often you drink alcohol.  Limit alcohol intake to no more than 1 drink per day for nonpregnant women. One drink equals 12 ounces of beer, 5 ounces of wine, or 1 ounces of hard liquor.  Do not use street  drugs.  Do not share needles.  Ask your health care provider for help if you need support or information about quitting drugs.  Tell your health care provider if you often feel depressed.  Tell your health care provider if you have ever been abused or do not feel safe at home. This information is not intended to replace advice given to you by your health care provider. Make sure you discuss any questions you have with your health care provider. Document Released: 08/13/2010 Document Revised: 07/06/2015 Document Reviewed: 11/01/2014 Elsevier Interactive Patient Education  2018 Elsevier Inc.  

## 2018-01-26 ENCOUNTER — Telehealth: Payer: Self-pay | Admitting: Family Medicine

## 2018-01-26 ENCOUNTER — Other Ambulatory Visit: Payer: 59

## 2018-01-26 ENCOUNTER — Other Ambulatory Visit: Payer: Self-pay | Admitting: Family Medicine

## 2018-01-26 DIAGNOSIS — Z0000 Encounter for general adult medical examination without abnormal findings: Secondary | ICD-10-CM

## 2018-01-26 MED ORDER — SYNTHROID 75 MCG PO TABS: 75.0000 ug | ORAL_TABLET | Freq: Every day | ORAL | 3 refills | Status: DC

## 2018-01-26 NOTE — Progress Notes (Signed)
Received staff message : Please have lab add the full cholesterol panel to her labs. Only total cholesterol ran accidentally. Wrong order placed and I did not catch it.  Thanks  Dr. Kirtland BouchardK   Faxed add on to [facility] but blood too old to perform complete panel.  Added order for blood to be sent to [facility] where the test should be okay to perform.

## 2018-01-26 NOTE — Telephone Encounter (Signed)
Please inform patient the following information: Her labs are normal- with exception of mildly low sodium of 133 (NL 135) and her potassium is low normal. Low sugar sports drinks, V8 jiuce and coconut water are good sources for these electrolytes- try to incorporate  one a day should keep leves in normal ranges We will call her when we get the full cholesterol panel, they only ran the total cholesterol and we had to call to request full panel- will take another few days.  I have refilled her meds.

## 2018-01-26 NOTE — Telephone Encounter (Signed)
Patient notified and verbalized understanding. 

## 2018-01-27 LAB — LIPID PANEL
CHOLESTEROL: 206 mg/dL — AB (ref <200)
HDL: 82 mg/dL (ref >50)
LDL Cholesterol (Calc): 108 mg/dL (calc) — ABNORMAL HIGH
Non-HDL Cholesterol (Calc): 124 mg/dL (calc) (ref <130)
Total CHOL/HDL Ratio: 2.5 (calc) (ref <5.0)
Triglycerides: 69 mg/dL (ref <150)

## 2018-02-16 ENCOUNTER — Ambulatory Visit: Payer: PRIVATE HEALTH INSURANCE | Admitting: Family Medicine

## 2018-02-16 ENCOUNTER — Encounter: Payer: Self-pay | Admitting: Family Medicine

## 2018-02-16 ENCOUNTER — Other Ambulatory Visit: Payer: Self-pay

## 2018-02-16 VITALS — BP 118/81 | HR 78 | Temp 98.6°F | Resp 14 | Ht 63.0 in | Wt 128.0 lb

## 2018-02-16 DIAGNOSIS — M7541 Impingement syndrome of right shoulder: Secondary | ICD-10-CM | POA: Diagnosis not present

## 2018-02-16 MED ORDER — PREDNISONE 20 MG PO TABS: ORAL_TABLET | ORAL | 0 refills | Status: DC

## 2018-02-16 NOTE — Patient Instructions (Signed)
Start prednisone taper for 10 days.  If still present after taper--- then we will need to get you to [facility] or ortho    Shoulder Impingement Syndrome  Shoulder impingement syndrome is a condition that causes pain when connective tissues (tendons) surrounding the shoulder joint become pinched. These tendons are part of the group of muscles and tissues that help to stabilize the shoulder (rotator cuff). Beneath the rotator cuff is a fluid-filled sac (bursa) that allows the muscles and tendons to glide smoothly. The bursa may become swollen or irritated (bursitis). Bursitis, swelling in the rotator cuff tendons, or both conditions can decrease how much space is under a bone in the shoulder joint (acromion), resulting in impingement. What are the causes? Shoulder impingement syndrome may be caused by bursitis or swelling of the rotator cuff tendons, which may result from:  Repetitive overhead arm movements.  Falling onto the shoulder.  Weakness in the shoulder muscles. What increases the risk? You may be more likely to develop this condition if you:  Play sports that involve throwing, such as baseball.  Participate in sports such as tennis, volleyball, and swimming.  Work as a Education administrator, Music therapist, or Pharmacologist. Some people are also more likely to develop impingement syndrome because of the shape of their acromion bone. What are the signs or symptoms? The main symptom of this condition is pain on the front or side of the shoulder. The pain may:  Get worse when lifting or raising the arm.  Get worse at night.  Wake you up from sleeping.  Feel sharp when the shoulder is moved and then fade to an ache. Other symptoms may include:  Tenderness.  Stiffness.  Inability to raise the arm above shoulder level or behind the body.  Weakness. How is this diagnosed? This condition may be diagnosed based on:  Your symptoms and medical history.  A physical exam.  Imaging tests, such  as: ? X-rays. ? MRI. ? Ultrasound. How is this treated? This condition may be treated by:  Resting your shoulder and avoiding all activities that cause pain or put stress on the shoulder.  Icing your shoulder.  NSAIDs to help reduce pain and swelling.  One or more injections of medicines to numb the area and reduce inflammation.  Physical therapy.  Surgery. This may be needed if nonsurgical treatments have not helped. Surgery may involve repairing the rotator cuff, reshaping the acromion, or removing the bursa. Follow these instructions at home: Managing pain, stiffness, and swelling   If directed, put ice on the injured area. ? Put ice in a plastic bag. ? Place a towel between your skin and the bag. ? Leave the ice on for 20 minutes, 2-3 times a day. Activity  Rest and return to your normal activities as told by your health care provider. Ask your health care provider what activities are safe for you.  Do exercises as told by your health care provider. General instructions  Do not use any products that contain nicotine or tobacco, such as cigarettes, e-cigarettes, and chewing tobacco. These can delay healing. If you need help quitting, ask your health care provider.  Ask your health care provider when it is safe for you to drive.  Take over-the-counter and prescription medicines only as told by your health care provider.  Keep all follow-up visits as told by your health care provider. This is important. How is this prevented?  Give your body time to rest between periods of activity.  Be safe and  responsible while being active. This will help you avoid falls.  Maintain physical fitness, including strength and flexibility. Contact a health care provider if:  Your symptoms have not improved after 1-2 months of treatment and rest.  You cannot lift your arm away from your body. Summary  Shoulder impingement syndrome is a condition that causes pain when connective  tissues (tendons) surrounding the shoulder joint become pinched.  The main symptom of this condition is pain on the front or side of the shoulder.  This condition is usually treated with rest, ice, and pain medicines as needed. This information is not intended to replace advice given to you by your health care provider. Make sure you discuss any questions you have with your health care provider. Document Released: 01/28/2005 Document Revised: 07/23/2017 Document Reviewed: 07/23/2017 Elsevier Interactive Patient Education  [employer].

## 2018-02-16 NOTE — Progress Notes (Signed)
Heather Chambers , Mar 28, 1955, 63 y.o., female MRN: [id number] Patient Care Team    Relationship Specialty Notifications Start End  Natalia Leatherwood, DO PCP - General Family Medicine  01/09/15   Harrington Challenger, NP Nurse Practitioner Obstetrics and Gynecology  01/18/16   Rachael Fee, MD Attending Physician Gastroenterology  01/18/16   Bobbitt, Heywood Iles, MD Consulting Physician Allergy and Immunology  01/22/17     Chief Complaint  Patient presents with  . Shoulder Pain    Started 01/15/18. Right shoulder pain. Over did it doing house work, decorating, blowing leaves. Advil helps some     Subjective: Pt presents for an OV with complaints of right shoulder pain  of 1 month duration.  Associated symptoms include pain with raising arm above head. Started after doing housework, decorations and leave blowing. No prior surgeries or injuries to neck or shoulder. Feels better when she takes NSAIDS, but doe snot resolve Pt has tried 800 mg ibuprofen, heat and massage to ease their symptoms.   Depression screen [facility] 2/9 01/23/2018 01/22/2017 01/18/2016 05/31/2014  Decreased Interest 0 0 0 0  Down, Depressed, Hopeless 0 0 0 0  PHQ - 2 Score 0 0 0 0    Allergies  Allergen Reactions  . Augmentin [Amoxicillin-Pot Clavulanate] Nausea And Vomiting  . Cefdinir Hives  . Etodolac     "LODINE"  . Phenergan [Promethazine Hcl]     Restlessness, twitching   Social History   Tobacco Use  . Smoking status: Never Smoker  . Smokeless tobacco: Never Used  Substance Use Topics  . Alcohol use: Yes    Alcohol/week: 4.0 standard drinks    Types: 4 Cans of beer per week    Comment: wine and beer   Past Medical History:  Diagnosis Date  . Allergy   . Arthritis   . Chronic pelvic pain in female 1995   LLQ  . Chronic rhinitis   . Health maintenance examination   . Hypothyroidism   . Menopausal symptoms   . Migraine   . Osteopenia 03/2017   T score -2.1 FRAX 7.5% / 0.7%   Past Surgical History:    Procedure Laterality Date  . FOOT SURGERY Right 05/31/2016   joint replacement  . PELVIC LAPAROSCOPY     DIAG LAP   Family History  Problem Relation Age of Onset  . Allergic rhinitis Mother   . Migraines Mother   . Osteoarthritis Mother   . Breast cancer Maternal Grandmother   . Allergic rhinitis Maternal Grandmother   . Heart disease Maternal Grandfather   . Breast cancer Sister   . Angioedema Neg Hx   . Asthma Neg Hx   . Atopy Neg Hx   . Eczema Neg Hx   . Immunodeficiency Neg Hx   . Urticaria Neg Hx    Allergies as of 02/16/2018      Reactions   Augmentin [amoxicillin-pot Clavulanate] Nausea And Vomiting   Cefdinir Hives   Etodolac    "LODINE"   Phenergan [promethazine Hcl]    Restlessness, twitching      Medication List       Accurate as of February 16, 2018  1:54 PM. Always use your most recent med list.        Beclomethasone Dipropionate 80 MCG/ACT Aers Commonly known as:  QNASL Place 1 spray into the nose daily.   Carbinoxamine Maleate 4 MG Tabs Take 1 tablet (4 mg total) by mouth as needed (every 6-8 hours).  Fremanezumab-vfrm 225 MG/1.5ML Sosy Commonly known as:  AJOVY Inject 225 mg into the skin every 30 (thirty) days. Do not use with Aimovig.   ipratropium 0.06 % nasal spray Commonly known as:  ATROVENT PLACE 2 SPRAYS INTO BOTH NOSTRILS 3 (THREE) TIMES DAILY.   PREMARIN vaginal cream Generic drug:  conjugated estrogens Use small amt 1/2 applicator 3 times week   SUMAtriptan 100 MG tablet Commonly known as:  IMITREX TAKE 1 TABLET ONCE. MAY REPEAT IN 2 HOURS IF HEADACHE PERSISTS OR RECURS   SYNTHROID 75 MCG tablet Generic drug:  levothyroxine Take 1 tablet (75 mcg total) by mouth daily. On an empty stomach   valACYclovir 500 MG tablet Commonly known as:  VALTREX Take 1 tablet by mouth twice daily as needed.       All past medical history, surgical history, allergies, family history, immunizations andmedications were updated in the EMR  today and reviewed under the history and medication portions of their EMR.     ROS: Negative, with the exception of above mentioned in HPI   Objective:  BP 118/81   Pulse 78   Temp 98.6 F (37 C) (Oral)   Resp 14   Ht 5\' 3"  (1.6 m)   Wt 128 lb (58.1 kg)   SpO2 97%   BMI 22.67 kg/m  Body mass index is 22.67 kg/m. Gen: Afebrile. No acute distress. Nontoxic in appearance, well developed, well nourished.  HENT: AT. [place].MMM Eyes:Pupils Equal Round Reactive to light, Extraocular movements intact,  Conjunctiva without redness, discharge or icterus. MSK: no erythema, no soft tissue swelling, TTP supraspinatus and deltoid. Pain with abduction- but ROM remains normal. + empty can test, lift off and hawkins. Not tender over bicep groove. NV intact distally.  Neuro: Normal gait. PERLA. EOMi. Alert. Oriented x3   No exam data present No results found. No results found for this or any previous visit (from the past 24 hour(s)).  Assessment/Plan: Heather Chambers is a 63 y.o. female present for OV for  Rotator cuff impingement syndrome, right No lifting over head. Prednisone taper prescribed. Heat and light massage.  If remains after 2-4 weeks, or worsening will refer to [facility] or ortho.  - f/u 2-4 prn   Reviewed expectations re: course of current medical issues.  Discussed self-management of symptoms.  Outlined signs and symptoms indicating need for more acute intervention.  Patient verbalized understanding and all questions were answered.  Patient received an After-Visit Summary.    No orders of the defined types were placed in this encounter.   Note is dictated utilizing voice recognition software. Although note has been proof read prior to signing, occasional typographical errors still can be missed. If any questions arise, please do not hesitate to call for verification.   electronically signed by:  Felix Pacini, DO  [facility] [place]

## 2018-03-11 ENCOUNTER — Telehealth: Payer: Self-pay | Admitting: Family Medicine

## 2018-03-11 DIAGNOSIS — S4990XD Unspecified injury of shoulder and upper arm, unspecified arm, subsequent encounter: Secondary | ICD-10-CM

## 2018-03-11 DIAGNOSIS — M25519 Pain in unspecified shoulder: Secondary | ICD-10-CM

## 2018-03-11 NOTE — Telephone Encounter (Signed)
Ok to refer. Please place for her for shoulder pain. To [facility]- Dr. Katrinka Blazing per pt request

## 2018-03-11 NOTE — Telephone Encounter (Signed)
Copied from CRM [id number]. Topic: Referral - Request for Referral >> Mar 11, 2018  1:06 PM Maia Petties wrote: Has patient seen PCP for this complaint? Yes 02/16/2018 OV with Dr. Claiborne Billings *If NO, is insurance requiring patient see PCP for this issue before PCP can refer them? Referral for which specialty: SM or Ortho Preferred provider/office: Dr. Antoine Primas (please check for being in network with [employer]) Reason for referral: pt states moderate improvement with shoulder pain since OV but wanting to see SM or Ortho

## 2018-03-11 NOTE — Telephone Encounter (Signed)
Referral placed to sm

## 2018-03-11 NOTE — Telephone Encounter (Signed)
Referral placed in Epic.

## 2018-03-19 ENCOUNTER — Encounter: Payer: Self-pay | Admitting: Family Medicine

## 2018-03-19 ENCOUNTER — Ambulatory Visit: Payer: Self-pay

## 2018-03-19 ENCOUNTER — Ambulatory Visit: Payer: PRIVATE HEALTH INSURANCE | Admitting: Family Medicine

## 2018-03-19 VITALS — BP 110/72 | Ht 63.0 in | Wt 126.0 lb

## 2018-03-19 DIAGNOSIS — M25511 Pain in right shoulder: Secondary | ICD-10-CM | POA: Diagnosis not present

## 2018-03-19 DIAGNOSIS — R202 Paresthesia of skin: Secondary | ICD-10-CM

## 2018-03-19 DIAGNOSIS — M7551 Bursitis of right shoulder: Secondary | ICD-10-CM | POA: Insufficient documentation

## 2018-03-19 DIAGNOSIS — R200 Anesthesia of skin: Secondary | ICD-10-CM

## 2018-03-19 MED ORDER — VITAMIN D (ERGOCALCIFEROL) 1.25 MG (50000 UNIT) PO CAPS: 50000.0000 [IU] | ORAL_CAPSULE | ORAL | 0 refills | Status: DC

## 2018-03-19 NOTE — Assessment & Plan Note (Signed)
Patient given injection today.  Tolerated procedure well.  Home exercises given and work with Event organiser.  Discussed icing regimen, discussed avoiding certain range of motion exercises.  Topical anti-inflammatory trial given 2.  Follow-up again in 4 to 6 weeks.  Patient also has signs are consistent with possible acromioclavicular pain as well as some trigger points of the shoulder and if worsening symptoms will consider injections

## 2018-03-19 NOTE — Progress Notes (Signed)
[name] [address] [place], [place] [address] Phone: [phone] Subjective:   Bruce Donath, am serving as a scribe for Dr. Antoine Primas.    CC: right shoulder pain   [id number]  Heather Chambers is a 63 y.o. female coming in with complaint of right shoulder pain. Patient states that her pain started December 2019. She overdid decorating and yard work which she feels caused her pain. Pain is located in shoulder joint down into her arm. Pain increases with IR and flexion. Constant pain and numbness and tingling into her hand. Uses NSAIDs for pain. Did try prednisone which helped decrease some of her pain.     Past Medical History:  Diagnosis Date  . Allergy   . Arthritis   . Chronic pelvic pain in female 1995   LLQ  . Chronic rhinitis   . Health maintenance examination   . Hypothyroidism   . Menopausal symptoms   . Migraine   . Osteopenia 03/2017   T score -2.1 FRAX 7.5% / 0.7%   Past Surgical History:  Procedure Laterality Date  . FOOT SURGERY Right 05/31/2016   joint replacement  . PELVIC LAPAROSCOPY     DIAG LAP   Social History   Socioeconomic History  . Marital status: Married    Spouse name: Not on file  . Number of children: 1  . Years of education: Not on file  . Highest education level: Master's degree (e.g., MA, MS, MEng, MEd, MSW, MBA)  Occupational History    Employer: [employer]  Social Needs  . Financial resource strain: Not on file  . Food insecurity:    Worry: Not on file    Inability: Not on file  . Transportation needs:    Medical: Not on file    Non-medical: Not on file  Tobacco Use  . Smoking status: Never Smoker  . Smokeless tobacco: Never Used  Substance and Sexual Activity  . Alcohol use: Yes    Alcohol/week: 4.0 standard drinks    Types: 4 Cans of beer per week    Comment: wine and beer  . Drug use: No  . Sexual activity: Yes    Birth control/protection: Post-menopausal    Lifestyle  . Physical activity:    Days per week: Not on file    Minutes per session: Not on file  . Stress: Not on file  Relationships  . Social connections:    Talks on phone: Not on file    Gets together: Not on file    Attends religious service: Not on file    Active member of club or organization: Not on file    Attends meetings of clubs or organizations: Not on file    Relationship status: Not on file  Other Topics Concern  . Not on file  Social History Narrative   Ms. Gane lives with her husband. She has a grown son who recently moved to [place] for a job. 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    Wears seatbelt.    Exercises 3x a week.    Smoke alarm in the home.    Feels safe in relationships   Right handed   Drinks decaf tea and coffee   Allergies  Allergen Reactions  . Augmentin [Amoxicillin-Pot Clavulanate] Nausea And Vomiting  . Cefdinir Hives  . Etodolac     "LODINE"  . Phenergan [Promethazine Hcl]  Restlessness, twitching   Family History  Problem Relation Age of Onset  . Allergic rhinitis Mother   . Migraines Mother   . Osteoarthritis Mother   . Breast cancer Maternal Grandmother   . Allergic rhinitis Maternal Grandmother   . Heart disease Maternal Grandfather   . Breast cancer Sister   . Angioedema Neg Hx   . Asthma Neg Hx   . Atopy Neg Hx   . Eczema Neg Hx   . Immunodeficiency Neg Hx   . Urticaria Neg Hx     Current Outpatient Medications (Endocrine & Metabolic):  .  predniSONE (DELTASONE) 20 MG tablet, 60 mg x3d, 40 mg x3d, 20 mg x2d, 10 mg x2d .  SYNTHROID 75 MCG tablet, Take 1 tablet (75 mcg total) by mouth daily. On an empty stomach   Current Outpatient Medications (Respiratory):  Marland Kitchen.  Beclomethasone Dipropionate (QNASL) 80 MCG/ACT AERS, Place 1 spray into the nose daily. .  Carbinoxamine Maleate 4 MG TABS, Take 1 tablet (4 mg total) by mouth as needed (every 6-8 hours). Marland Kitchen.  ipratropium  (ATROVENT) 0.06 % nasal spray, PLACE 2 SPRAYS INTO BOTH NOSTRILS 3 (THREE) TIMES DAILY.  Current Outpatient Medications (Analgesics):  Marland Kitchen.  Fremanezumab-vfrm (AJOVY) 225 MG/1.5ML SOSY, Inject 225 mg into the skin every 30 (thirty) days. Do not use with Aimovig. .  SUMAtriptan (IMITREX) 100 MG tablet, TAKE 1 TABLET ONCE. MAY REPEAT IN 2 HOURS IF HEADACHE PERSISTS OR RECURS   Current Outpatient Medications (Other):  Marland Kitchen.  PREMARIN vaginal cream, Use small amt 1/2 applicator 3 times week .  valACYclovir (VALTREX) 500 MG tablet, Take 1 tablet by mouth twice daily as needed. .  Vitamin D, Ergocalciferol, (DRISDOL) 1.25 MG (50000 UT) CAPS capsule, Take 1 capsule (50,000 Units total) by mouth every 7 (seven) days.    Past medical history, social, surgical and family history all reviewed in electronic medical record.  No pertanent information unless stated regarding to the chief complaint.   Review of Systems:  No headache, visual changes, nausea, vomiting, diarrhea, constipation, dizziness, abdominal pain, skin rash, fevers, chills, night sweats, weight loss, swollen lymph nodes, body aches, joint swelling, muscle aches, chest pain, shortness of breath, mood changes.   Objective  Blood pressure 110/72, height 5\' 3"  (1.6 m), weight 126 lb (57.2 kg).   General: No apparent distress alert and oriented x3 mood and affect normal, dressed appropriately.  HEENT: Pupils equal, extraocular movements intact  Respiratory: Patient's speak in full sentences and does not appear short of breath  Cardiovascular: No lower extremity edema, non tender, no erythema  Skin: Warm dry intact with no signs of infection or rash on extremities or on axial skeleton.  Abdomen: Soft nontender  Neuro: Cranial nerves II through XII are intact, neurovascularly intact in all extremities with 2+ DTRs and 2+ pulses.  Lymph: No lymphadenopathy of posterior or anterior cervical chain or axillae bilaterally.  Gait normal with good  balance and coordination.  MSK:  Non tender with full range of motion and good stability and symmetric strength and tone of elbows, wrist, hip, knee and ankles bilaterally.  Shoulder: Right Inspection reveals no abnormalities, atrophy or asymmetry. Palpation is normal with no tenderness over AC joint or bicipital groove. ROM is full in all planes passively. Rotator cuff strength normal throughout. signs of impingement with positive Neer and Hawkin's tests, but negative empty can sign. Speeds and Yergason's tests normal. No labral pathology noted with negative Obrien's, negative clunk and good stability. Normal scapular function  observed. No painful arc and no drop arm sign. No apprehension sign Contralateral shoulder unremarkable   After informed written and verbal consent, patient was seated on exam table. Right shoulder was prepped with alcohol swab and utilizing posterior approach, patient's right glenohumeral space was injected with 4:1  marcaine 0.5%: Kenalog 40mg /dL. Patient tolerated the procedure well without immediate complications.  [id number]; 15 additional minutes spent for Therapeutic exercises as stated in above notes.  This included exercises focusing on stretching, strengthening, with significant focus on eccentric aspects.   Long term goals include an improvement in range of motion, strength, endurance as well as avoiding reinjury. Patient's frequency would include in 1-2 times a day, 3-5 times a week for a duration of 6-12 weeks.  Shoulder Exercises that included:  Basic scapular stabilization to include adduction and depression of scapula Scaption, focusing on proper movement and good control Internal and External rotation utilizing a theraband, with elbow tucked at side entire time Rows with theraband  Given  Proper technique shown and discussed handout in great detail with ATC.  All questions were discussed and answered.      Impression and Recommendations:     This case  required medical decision making of moderate complexity. The above documentation has been reviewed and is accurate and complete Judi Saa, DO       Note: This dictation was prepared with Dragon dictation along with smaller phrase technology. Any transcriptional errors that result from this process are unintentional.

## 2018-03-19 NOTE — Patient Instructions (Addendum)
Good to see you Ice 20 minutes 2 times daily. Usually after activity and before bed. Exercises 3 times a week.  pennsaid pinkie amount topically 2 times daily as needed.  Keep hands within peripheral vision  Once weekly vitamin D  See me again in 4 weeks

## 2018-04-22 ENCOUNTER — Ambulatory Visit: Payer: PRIVATE HEALTH INSURANCE | Admitting: Family Medicine

## 2018-05-03 NOTE — Progress Notes (Signed)
[name] [address] [place], [place] [address] Phone: [phone] Subjective:   I Heather Chambers am serving as a Neurosurgeon for Dr. Antoine Primas.  I'm seeing this patient by the request  of:    CC: Right shoulder pain follow-up  [id number]   03/19/2018 Patient given injection today.  Tolerated procedure well.  Home exercises given and work with Event organiser.  Discussed icing regimen, discussed avoiding certain range of motion exercises.  Topical anti-inflammatory trial given 2.  Follow-up again in 4 to 6 weeks.  Patient also has signs are consistent with possible acromioclavicular pain as well as some trigger points of the shoulder and if worsening symptoms will consider injections  Updated 05/04/2018 Heather Chambers is a 63 y.o. female coming in with complaint of right shoulder pain. States that she is doing a lot better. Still some discomfort. Sore with certain ROM. Pain with some activity.  Worse with sleeping on it.  Would state overall approximately 80% better.  Has noticed with repetitive activity still seems to be a little uncomfortable.   Was seen for right shoulder pain primary 08/01/2018 given an injection at that time.  Patient also given home exercises.  Past Medical History:  Diagnosis Date  . Allergy   . Arthritis   . Chronic pelvic pain in female 1995   LLQ  . Chronic rhinitis   . Health maintenance examination   . Hypothyroidism   . Menopausal symptoms   . Migraine   . Osteopenia 03/2017   T score -2.1 FRAX 7.5% / 0.7%   Past Surgical History:  Procedure Laterality Date  . FOOT SURGERY Right 05/31/2016   joint replacement  . PELVIC LAPAROSCOPY     DIAG LAP   Social History   Socioeconomic History  . Marital status: Married    Spouse name: Not on file  . Number of children: 1  . Years of education: Not on file  . Highest education level: Master's degree (e.g., MA, MS, MEng, MEd, MSW, MBA)  Occupational History   Employer: [employer]  Social Needs  . Financial resource strain: Not on file  . Food insecurity:    Worry: Not on file    Inability: Not on file  . Transportation needs:    Medical: Not on file    Non-medical: Not on file  Tobacco Use  . Smoking status: Never Smoker  . Smokeless tobacco: Never Used  Substance and Sexual Activity  . Alcohol use: Yes    Alcohol/week: 4.0 standard drinks    Types: 4 Cans of beer per week    Comment: wine and beer  . Drug use: No  . Sexual activity: Yes    Birth control/protection: Post-menopausal  Lifestyle  . Physical activity:    Days per week: Not on file    Minutes per session: Not on file  . Stress: Not on file  Relationships  . Social connections:    Talks on phone: Not on file    Gets together: Not on file    Attends religious service: Not on file    Active member of club or organization: Not on file    Attends meetings of clubs or organizations: Not on file    Relationship status: Not on file  Other Topics Concern  . Not on file  Social History Narrative   Ms. Venne lives with her husband. She has a grown son who recently moved to [place] for a job.  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    Wears seatbelt.    Exercises 3x a week.    Smoke alarm in the home.    Feels safe in relationships   Right handed   Drinks decaf tea and coffee   Allergies  Allergen Reactions  . Augmentin [Amoxicillin-Pot Clavulanate] Nausea And Vomiting  . Cefdinir Hives  . Etodolac     "LODINE"  . Phenergan [Promethazine Hcl]     Restlessness, twitching   Family History  Problem Relation Age of Onset  . Allergic rhinitis Mother   . Migraines Mother   . Osteoarthritis Mother   . Breast cancer Maternal Grandmother   . Allergic rhinitis Maternal Grandmother   . Heart disease Maternal Grandfather   . Breast cancer Sister   . Angioedema Neg Hx   . Asthma Neg Hx   . Atopy Neg Hx    . Eczema Neg Hx   . Immunodeficiency Neg Hx   . Urticaria Neg Hx     Current Outpatient Medications (Endocrine & Metabolic):  .  predniSONE (DELTASONE) 20 MG tablet, 60 mg x3d, 40 mg x3d, 20 mg x2d, 10 mg x2d .  SYNTHROID 75 MCG tablet, Take 1 tablet (75 mcg total) by mouth daily. On an empty stomach   Current Outpatient Medications (Respiratory):  Marland Kitchen  Beclomethasone Dipropionate (QNASL) 80 MCG/ACT AERS, Place 1 spray into the nose daily. .  Carbinoxamine Maleate 4 MG TABS, Take 1 tablet (4 mg total) by mouth as needed (every 6-8 hours). Marland Kitchen  ipratropium (ATROVENT) 0.06 % nasal spray, PLACE 2 SPRAYS INTO BOTH NOSTRILS 3 (THREE) TIMES DAILY.  Current Outpatient Medications (Analgesics):  Marland Kitchen  Fremanezumab-vfrm (AJOVY) 225 MG/1.5ML SOSY, Inject 225 mg into the skin every 30 (thirty) days. Do not use with Aimovig. .  SUMAtriptan (IMITREX) 100 MG tablet, TAKE 1 TABLET ONCE. MAY REPEAT IN 2 HOURS IF HEADACHE PERSISTS OR RECURS   Current Outpatient Medications (Other):  Marland Kitchen  PREMARIN vaginal cream, Use small amt 1/2 applicator 3 times week .  valACYclovir (VALTREX) 500 MG tablet, Take 1 tablet by mouth twice daily as needed. .  Vitamin D, Ergocalciferol, (DRISDOL) 1.25 MG (50000 UT) CAPS capsule, Take 1 capsule (50,000 Units total) by mouth every 7 (seven) days.    Past medical history, social, surgical and family history all reviewed in electronic medical record.  No pertanent information unless stated regarding to the chief complaint.   Review of Systems:  No headache, visual changes, nausea, vomiting, diarrhea, constipation, dizziness, abdominal pain, skin rash, fevers, chills, night sweats, weight loss, swollen lymph nodes, body aches, joint swelling, muscle aches, chest pain, shortness of breath, mood changes.   Objective  Blood pressure (!) 144/84, pulse 95, height  (1.6 m), weight 129 lb (58.5 kg), SpO2 93 %.   General: No apparent distress alert and oriented x3 mood and affect  normal, dressed appropriately.  HEENT: Pupils equal, extraocular movements intact  Respiratory: Patient's speak in full sentences and does not appear short of breath  Cardiovascular: No lower extremity edema, non tender, no erythema  Skin: Warm dry intact with no signs of infection or rash on extremities or on axial skeleton.  Abdomen: Soft nontender  Neuro: Cranial nerves II through XII are intact, neurovascularly intact in all extremities with 2+ DTRs and 2+ pulses.  Lymph: No lymphadenopathy of posterior or anterior cervical chain or axillae bilaterally.  Gait normal with good balance and  coordination.  MSK:  Non tender with full range of motion and good stability and symmetric strength and tone of  elbows, wrist, hip, knee and ankles bilaterally.  Right shoulder shows the patient does still have some mild impingement.  Positive crossover.  Pain over the acromioclavicular joint still noted.  Otherwise near full range of motion.  Rotator cuff strength 5 out of 5.  Procedure: Real-time Ultrasound Guided Injection of right AC joint.  Device: GE Logiq Q7 Ultrasound guided injection is preferred based studies that show increased duration, increased effect, greater accuracy, decreased procedural pain, increased response rate, and decreased cost with ultrasound guided versus blind injection.  Verbal informed consent obtained.  Time-out conducted.  Noted no overlying erythema, induration, or other signs of local infection.  Skin prepped in a sterile fashion.  Local anesthesia: Topical Ethyl chloride.  With sterile technique and under real time ultrasound guidance: With a 25-gauge half inch needle was injected with 0.5 cc of 0.5% Marcaine and 0.5 cc of Kenalog 40 mg/mL Completed without difficulty  Pain immediately resolved suggesting accurate placement of the medication.  Advised to call if fevers/chills, erythema, induration, drainage, or persistent bleeding.  Images permanently stored and  available for review in the ultrasound unit.  Impression: Technically successful ultrasound guided injection.    Impression and Recommendations:     This case required medical decision making of moderate complexity. The above documentation has been reviewed and is accurate and complete Judi Saa, DO       Note: This dictation was prepared with Dragon dictation along with smaller phrase technology. Any transcriptional errors that result from this process are unintentional.

## 2018-05-04 ENCOUNTER — Encounter: Payer: Self-pay | Admitting: Family Medicine

## 2018-05-04 ENCOUNTER — Ambulatory Visit: Payer: PRIVATE HEALTH INSURANCE | Admitting: Family Medicine

## 2018-05-04 ENCOUNTER — Other Ambulatory Visit: Payer: Self-pay

## 2018-05-04 ENCOUNTER — Ambulatory Visit: Payer: Self-pay

## 2018-05-04 VITALS — BP 144/84 | HR 95 | Ht 63.0 in | Wt 129.0 lb

## 2018-05-04 DIAGNOSIS — M7551 Bursitis of right shoulder: Secondary | ICD-10-CM | POA: Diagnosis not present

## 2018-05-04 DIAGNOSIS — M19011 Primary osteoarthritis, right shoulder: Secondary | ICD-10-CM | POA: Diagnosis not present

## 2018-05-04 DIAGNOSIS — G8929 Other chronic pain: Secondary | ICD-10-CM

## 2018-05-04 DIAGNOSIS — M25511 Pain in right shoulder: Secondary | ICD-10-CM

## 2018-05-04 HISTORY — DX: Primary osteoarthritis, right shoulder: M19.011

## 2018-05-04 NOTE — Assessment & Plan Note (Signed)
Doing decent overall.  I do believe that the injection was helpful.  No significant changes in management with regard to the bursitis.

## 2018-05-04 NOTE — Assessment & Plan Note (Signed)
Patient given injection.  Tolerated procedure well.  Discussed icing regimen discussed which activities do which was to avoid.  Patient will increase activity as tolerated.  Follow-up with me again in 6 weeks.

## 2018-05-04 NOTE — Patient Instructions (Signed)
Good to see you  Ice 20 minutes 2 times daily. Usually after activity and before bed. Kjepe hands within peripheral vison  Injected the [facility] joint and I really hope that helps Stay active See me again in 2 months if not perfect  Can do a web video if you need to see me sooner- just call [phone] or my chart to discuss with [place].

## 2018-07-08 NOTE — Progress Notes (Signed)
[name] [address] [place], [place] [address] Phone: [phone] Subjective:   Heather Chambers, am serving as a scribe for Dr. Antoine Primas.    CC: shoulder pain follow up   BJY:[id number]   05/04/2018: Doing decent overall.  I do believe that the injection was helpful.  No significant changes in management with regard to the bursitis.  Update 07/09/2018: Heather Chambers is a 62 y.o. female coming in with complaint of right shoulder pain. States that shoulder was better last visit. Has increased using the arm. Performing housework and yardwork increases her pain. Pain occurs on top of shoulder and down into the tricep and into the scapula. Ice does seem to help. Patient feels like she cannot do everything she would like to do with the increasing pain.  Feels like if anything she has worsened over the course of time.  This is the first time that she has worsened in a while.      Past Medical History:  Diagnosis Date  . Allergy   . Arthritis   . Chronic pelvic pain in female 1995   LLQ  . Chronic rhinitis   . Health maintenance examination   . Hypothyroidism   . Menopausal symptoms   . Migraine   . Osteopenia 03/2017   T score -2.1 FRAX 7.5% / 0.7%   Past Surgical History:  Procedure Laterality Date  . FOOT SURGERY Right 05/31/2016   joint replacement  . PELVIC LAPAROSCOPY     DIAG LAP   Social History   Socioeconomic History  . Marital status: Married    Spouse name: Not on file  . Number of children: 1  . Years of education: Not on file  . Highest education level: Master's degree (e.g., MA, MS, MEng, MEd, MSW, MBA)  Occupational History    Employer: [employer]  Social Needs  . Financial resource strain: Not on file  . Food insecurity:    Worry: Not on file    Inability: Not on file  . Transportation needs:    Medical: Not on file    Non-medical: Not on file  Tobacco Use  . Smoking status: Never Smoker  .  Smokeless tobacco: Never Used  Substance and Sexual Activity  . Alcohol use: Yes    Alcohol/week: 4.0 standard drinks    Types: 4 Cans of beer per week    Comment: wine and beer  . Drug use: No  . Sexual activity: Yes    Birth control/protection: Post-menopausal  Lifestyle  . Physical activity:    Days per week: Not on file    Minutes per session: Not on file  . Stress: Not on file  Relationships  . Social connections:    Talks on phone: Not on file    Gets together: Not on file    Attends religious service: Not on file    Active member of club or organization: Not on file    Attends meetings of clubs or organizations: Not on file    Relationship status: Not on file  Other Topics Concern  . Not on file  Social History Narrative   Ms. Januszewski lives with her husband. She has a grown son who recently moved to [place] for a job. 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    Wears seatbelt.    Exercises 3x a week.    Smoke alarm in the  home.    Feels safe in relationships   Right handed   Drinks decaf tea and coffee   Allergies  Allergen Reactions  . Augmentin [Amoxicillin-Pot Clavulanate] Nausea And Vomiting  . Cefdinir Hives  . Etodolac     "LODINE"  . Phenergan [Promethazine Hcl]     Restlessness, twitching   Family History  Problem Relation Age of Onset  . Allergic rhinitis Mother   . Migraines Mother   . Osteoarthritis Mother   . Breast cancer Maternal Grandmother   . Allergic rhinitis Maternal Grandmother   . Heart disease Maternal Grandfather   . Breast cancer Sister   . Angioedema Neg Hx   . Asthma Neg Hx   . Atopy Neg Hx   . Eczema Neg Hx   . Immunodeficiency Neg Hx   . Urticaria Neg Hx     Current Outpatient Medications (Endocrine & Metabolic):  .  predniSONE (DELTASONE) 20 MG tablet, 60 mg x3d, 40 mg x3d, 20 mg x2d, 10 mg x2d .  SYNTHROID 75 MCG tablet, Take 1 tablet (75 mcg total) by mouth daily. On an empty  stomach   Current Outpatient Medications (Respiratory):  Marland Kitchen  Beclomethasone Dipropionate (QNASL) 80 MCG/ACT AERS, Place 1 spray into the nose daily. .  Carbinoxamine Maleate 4 MG TABS, Take 1 tablet (4 mg total) by mouth as needed (every 6-8 hours). Marland Kitchen  ipratropium (ATROVENT) 0.06 % nasal spray, PLACE 2 SPRAYS INTO BOTH NOSTRILS 3 (THREE) TIMES DAILY.  Current Outpatient Medications (Analgesics):  Marland Kitchen  Fremanezumab-vfrm (AJOVY) 225 MG/1.5ML SOSY, Inject 225 mg into the skin every 30 (thirty) days. Do not use with Aimovig. .  SUMAtriptan (IMITREX) 100 MG tablet, TAKE 1 TABLET ONCE. MAY REPEAT IN 2 HOURS IF HEADACHE PERSISTS OR RECURS .  meloxicam (MOBIC) 15 MG tablet, Take 1 tablet (15 mg total) by mouth daily.   Current Outpatient Medications (Other):  Marland Kitchen  PREMARIN vaginal cream, Use small amt 1/2 applicator 3 times week .  valACYclovir (VALTREX) 500 MG tablet, Take 1 tablet by mouth twice daily as needed. .  Vitamin D, Ergocalciferol, (DRISDOL) 1.25 MG (50000 UT) CAPS capsule, Take 1 capsule (50,000 Units total) by mouth every 7 (seven) days.    Past medical history, social, surgical and family history all reviewed in electronic medical record.  No pertanent information unless stated regarding to the chief complaint.   Review of Systems:  No headache, visual changes, nausea, vomiting, diarrhea, constipation, dizziness, abdominal pain, skin rash, fevers, chills, night sweats, weight loss, swollen lymph nodes, body aches, joint swelling,  chest pain, shortness of breath, mood changes.  Positive muscle aches   Objective  Blood pressure 122/88, height 5\' 3"  (1.6 m).    General: No apparent distress alert and oriented x3 mood and affect normal, dressed appropriately.  HEENT: Pupils equal, extraocular movements intact  Respiratory: Patient's speak in full sentences and does not appear short of breath  Cardiovascular: No lower extremity edema, non tender, no erythema  Skin: Warm dry intact  with no signs of infection or rash on extremities or on axial skeleton.  Abdomen: Soft nontender  Neuro: Cranial nerves II through XII are intact, neurovascularly intact in all extremities with 2+ DTRs and 2+ pulses.  Lymph: No lymphadenopathy of posterior or anterior cervical chain or axillae bilaterally.  Gait normal with good balance and coordination.  MSK:  Non tender with full range of motion and good stability and symmetric strength and tone of  elbows, wrist, hip, knee and  ankles bilaterally.  Right shoulder exam still shows some mild impingement.  No significant atrophy.  Mild positive crossover and positive O'Brien's.      Impression and Recommendations:     This case required medical decision making of moderate complexity. The above documentation has been reviewed and is accurate and complete Heather SaaZachary M Avik Leoni, DO       Note: This dictation was prepared with Dragon dictation along with smaller phrase technology. Any transcriptional errors that result from this process are unintentional.

## 2018-07-09 ENCOUNTER — Ambulatory Visit: Payer: Self-pay

## 2018-07-09 ENCOUNTER — Encounter: Payer: Self-pay | Admitting: Family Medicine

## 2018-07-09 ENCOUNTER — Ambulatory Visit (INDEPENDENT_AMBULATORY_CARE_PROVIDER_SITE_OTHER): Payer: PRIVATE HEALTH INSURANCE | Admitting: Family Medicine

## 2018-07-09 ENCOUNTER — Other Ambulatory Visit: Payer: Self-pay

## 2018-07-09 VITALS — BP 122/88 | Ht 63.0 in

## 2018-07-09 DIAGNOSIS — G8929 Other chronic pain: Secondary | ICD-10-CM | POA: Diagnosis not present

## 2018-07-09 DIAGNOSIS — M24111 Other articular cartilage disorders, right shoulder: Secondary | ICD-10-CM | POA: Diagnosis not present

## 2018-07-09 DIAGNOSIS — M25511 Pain in right shoulder: Secondary | ICD-10-CM | POA: Diagnosis not present

## 2018-07-09 HISTORY — DX: Other articular cartilage disorders, right shoulder: M24.111

## 2018-07-09 MED ORDER — MELOXICAM 15 MG PO TABS: 15.0000 mg | ORAL_TABLET | Freq: Every day | ORAL | 0 refills | Status: DC

## 2018-07-09 NOTE — Patient Instructions (Signed)
Good to See you  We will get MRI  Try meloxicam daily for 10 days then as needed If you have not heard from the imaging then call [phone]  I will write you in my chart

## 2018-07-09 NOTE — Assessment & Plan Note (Addendum)
Concern at this time that we could be missing a potential labral tear.  Patient had been improving and now worsening again.  Can be a reactive bursitis but patient has had pain for nearly 6 months.  I feel at this point patient should increase activity as tolerated but we will get advanced imaging including an MR arthrogram.  Failed all other conservative therapy including formal physical therapy.

## 2018-07-13 NOTE — Addendum Note (Signed)
Addended by: Edwena Felty T on: 07/13/2018 02:08 PM   Modules accepted: Orders

## 2018-07-16 ENCOUNTER — Telehealth: Payer: Self-pay | Admitting: Family Medicine

## 2018-07-16 DIAGNOSIS — M24111 Other articular cartilage disorders, right shoulder: Secondary | ICD-10-CM

## 2018-07-16 NOTE — Telephone Encounter (Signed)
Copied from CRM [id number]. Topic: Quick Communication - See Telephone Encounter >> Jul 16, 2018 11:12 AM Aretta Nip wrote: CRM for notification. See Telephone encounter for: 07/16/18. PT is  being sch for imaging at [facility] pt Order from Dr Terrilee Files states right shoulder with contrast, resend also to say without contrast in order for them to sch. Contact Maggie at [phone] if any questions

## 2018-07-16 NOTE — Telephone Encounter (Signed)
Order corrected & faxed.

## 2018-08-06 ENCOUNTER — Other Ambulatory Visit: Payer: Self-pay | Admitting: Women's Health

## 2018-08-13 ENCOUNTER — Ambulatory Visit
Admission: RE | Admit: 2018-08-13 | Discharge: 2018-08-13 | Disposition: A | Discharge status: Home / Self Care | Payer: PRIVATE HEALTH INSURANCE | Source: Ambulatory Visit | Attending: Family Medicine | Admitting: Family Medicine

## 2018-08-13 ENCOUNTER — Other Ambulatory Visit: Payer: Self-pay

## 2018-08-13 DIAGNOSIS — G8929 Other chronic pain: Secondary | ICD-10-CM

## 2018-08-13 DIAGNOSIS — M25511 Pain in right shoulder: Secondary | ICD-10-CM

## 2018-08-13 MED ORDER — IOPAMIDOL (ISOVUE-M 200) INJECTION 41%
13.0000 mL | Freq: Once | INTRAMUSCULAR | Status: AC
  Administered 2018-08-13: 13 mL via INTRA_ARTICULAR

## 2018-08-16 ENCOUNTER — Encounter: Payer: Self-pay | Admitting: Family Medicine

## 2018-08-22 ENCOUNTER — Other Ambulatory Visit: Payer: Self-pay | Admitting: Allergy and Immunology

## 2018-09-16 ENCOUNTER — Other Ambulatory Visit: Payer: Self-pay | Admitting: Women's Health

## 2018-09-16 MED ORDER — ESTRADIOL-NORETHINDRONE ACET 0.5-0.1 MG PO TABS: 1.0000 | ORAL_TABLET | Freq: Every day | ORAL | 0 refills | Status: DC

## 2018-10-28 ENCOUNTER — Telehealth: Payer: Self-pay | Admitting: Family Medicine

## 2018-10-28 DIAGNOSIS — J3089 Other allergic rhinitis: Secondary | ICD-10-CM

## 2018-10-28 DIAGNOSIS — J321 Chronic frontal sinusitis: Secondary | ICD-10-CM

## 2018-10-28 NOTE — Telephone Encounter (Signed)
Called patient and lvm to call me back in regards to medication refills

## 2018-10-28 NOTE — Telephone Encounter (Signed)
Patient requests refill  ipratropium (ATROVENT) 0.06 % nasal spray [[id number]]   Beclomethasone Dipropionate (QNASL) 80 MCG/ACT AERS [[id number]]   [facility]

## 2018-10-29 ENCOUNTER — Other Ambulatory Visit: Payer: Self-pay | Admitting: Women's Health

## 2018-11-02 MED ORDER — QNASL 80 MCG/ACT NA AERS: 1.0000 | INHALATION_SPRAY | Freq: Every day | NASAL | 4 refills | Status: DC

## 2018-11-02 MED ORDER — IPRATROPIUM BROMIDE 0.06 % NA SOLN: 2.0000 | Freq: Three times a day (TID) | NASAL | 4 refills | Status: DC

## 2018-11-02 NOTE — Telephone Encounter (Signed)
We can certainly get her refills to last her until her appt in December on the Qnasl.  The Atrovent was not prescribed by this provider - it was prescribed by her asthma/allergy doctor in December 2019.   I did go ahead and refill both to last until December appt. Please make pt aware

## 2018-11-02 NOTE — Telephone Encounter (Signed)
Pt returned call and said she only comes 1x yearly for her CPE which is not due until December. She would like refill for medication.   RF request for Atrovent nasal spray and Qnasal  LOV: 01/23/2018 CPE, Acute visit 07/18/2018 Next ov: Not scheduled  Last written: Atrovent 01/14/2018 15 mL x5 refills  Qnasl 01/23/2018 8.7 inhaler x3 refills    Will call patient and schedule CPE for 01/2019.

## 2018-11-02 NOTE — Telephone Encounter (Signed)
Pt was called and information. She could not schedule CPE because she was on a conference call and stated she would have to call back to get scheduled.

## 2018-11-02 NOTE — Addendum Note (Signed)
Addended by: Howard Pouch A on: 11/02/2018 12:21 PM   Modules accepted: Orders

## 2018-11-05 ENCOUNTER — Ambulatory Visit: Payer: PRIVATE HEALTH INSURANCE | Admitting: Family Medicine

## 2018-11-10 ENCOUNTER — Encounter: Payer: Self-pay | Admitting: Gynecology

## 2018-11-16 ENCOUNTER — Other Ambulatory Visit: Payer: Self-pay

## 2018-11-17 ENCOUNTER — Ambulatory Visit (INDEPENDENT_AMBULATORY_CARE_PROVIDER_SITE_OTHER): Payer: PRIVATE HEALTH INSURANCE | Admitting: Women's Health

## 2018-11-17 ENCOUNTER — Encounter: Payer: Self-pay | Admitting: Women's Health

## 2018-11-17 VITALS — BP 110/78 | Ht 63.0 in | Wt 126.0 lb

## 2018-11-17 DIAGNOSIS — Z01419 Encounter for gynecological examination (general) (routine) without abnormal findings: Secondary | ICD-10-CM | POA: Diagnosis not present

## 2018-11-17 DIAGNOSIS — B009 Herpesviral infection, unspecified: Secondary | ICD-10-CM | POA: Diagnosis not present

## 2018-11-17 MED ORDER — ESTRADIOL-NORETHINDRONE ACET 0.5-0.1 MG PO TABS: 1.0000 | ORAL_TABLET | Freq: Every day | ORAL | 4 refills | Status: DC

## 2018-11-17 NOTE — Patient Instructions (Addendum)
It was good to see you today! Vit D 2000 iu   Health Maintenance for Postmenopausal Women Menopause is a normal process in which your ability to get pregnant comes to an end. This process happens slowly over many months or years, usually between the ages of 57 and 45. Menopause is complete when you have missed your menstrual periods for 12 months. It is important to talk with your health care provider about some of the most common conditions that affect women after menopause (postmenopausal women). These include heart disease, cancer, and bone loss (osteoporosis). Adopting a healthy lifestyle and getting preventive care can help to promote your health and wellness. The actions you take can also lower your chances of developing some of these common conditions. What should I know about menopause? During menopause, you may get a number of symptoms, such as:  Hot flashes. These can be moderate or severe.  Night sweats.  Decrease in sex drive.  Mood swings.  Headaches.  Tiredness.  Irritability.  Memory problems.  Insomnia. Choosing to treat or not to treat these symptoms is a decision that you make with your health care provider. Do I need hormone replacement therapy?  Hormone replacement therapy is effective in treating symptoms that are caused by menopause, such as hot flashes and night sweats.  Hormone replacement carries certain risks, especially as you become older. If you are thinking about using estrogen or estrogen with progestin, discuss the benefits and risks with your health care provider. What is my risk for heart disease and stroke? The risk of heart disease, heart attack, and stroke increases as you age. One of the causes may be a change in the body's hormones during menopause. This can affect how your body uses dietary fats, triglycerides, and cholesterol. Heart attack and stroke are medical emergencies. There are many things that you can do to help prevent heart disease  and stroke. Watch your blood pressure  High blood pressure causes heart disease and increases the risk of stroke. This is more likely to develop in people who have high blood pressure readings, are of African descent, or are overweight.  Have your blood pressure checked: ? Every 3-5 years if you are 31-12 years of age. ? Every year if you are 40 years old or older. Eat a healthy diet   Eat a diet that includes plenty of vegetables, fruits, low-fat dairy products, and lean protein.  Do not eat a lot of foods that are high in solid fats, added sugars, or sodium. Get regular exercise Get regular exercise. This is one of the most important things you can do for your health. Most adults should:  Try to exercise for at least 150 minutes each week. The exercise should increase your heart rate and make you sweat (moderate-intensity exercise).  Try to do strengthening exercises at least twice each week. Do these in addition to the moderate-intensity exercise.  Spend less time sitting. Even light physical activity can be beneficial. Other tips  Work with your health care provider to achieve or maintain a healthy weight.  Do not use any products that contain nicotine or tobacco, such as cigarettes, e-cigarettes, and chewing tobacco. If you need help quitting, ask your health care provider.  Know your numbers. Ask your health care provider to check your cholesterol and your blood sugar (glucose). Continue to have your blood tested as directed by your health care provider. Do I need screening for cancer? Depending on your health history and family history, you  may need to have cancer screening at different stages of your life. This may include screening for:  Breast cancer.  Cervical cancer.  Lung cancer.  Colorectal cancer. What is my risk for osteoporosis? After menopause, you may be at increased risk for osteoporosis. Osteoporosis is a condition in which bone destruction happens more  quickly than new bone creation. To help prevent osteoporosis or the bone fractures that can happen because of osteoporosis, you may take the following actions:  If you are 68-38 years old, get at least 1,000 mg of calcium and at least 600 mg of vitamin D per day.  If you are older than age 31 but younger than age 6, get at least 1,200 mg of calcium and at least 600 mg of vitamin D per day.  If you are older than age 44, get at least 1,200 mg of calcium and at least 800 mg of vitamin D per day. Smoking and drinking excessive alcohol increase the risk of osteoporosis. Eat foods that are rich in calcium and vitamin D, and do weight-bearing exercises several times each week as directed by your health care provider. How does menopause affect my mental health? Depression may occur at any age, but it is more common as you become older. Common symptoms of depression include:  Low or sad mood.  Changes in sleep patterns.  Changes in appetite or eating patterns.  Feeling an overall lack of motivation or enjoyment of activities that you previously enjoyed.  Frequent crying spells. Talk with your health care provider if you think that you are experiencing depression. General instructions See your health care provider for regular wellness exams and vaccines. This may include:  Scheduling regular health, dental, and eye exams.  Getting and maintaining your vaccines. These include: ? Influenza vaccine. Get this vaccine each year before the flu season begins. ? Pneumonia vaccine. ? Shingles vaccine. ? Tetanus, diphtheria, and pertussis (Tdap) booster vaccine. Your health care provider may also recommend other immunizations. Tell your health care provider if you have ever been abused or do not feel safe at home. Summary  Menopause is a normal process in which your ability to get pregnant comes to an end.  This condition causes hot flashes, night sweats, decreased interest in sex, mood swings,  headaches, or lack of sleep.  Treatment for this condition may include hormone replacement therapy.  Take actions to keep yourself healthy, including exercising regularly, eating a healthy diet, watching your weight, and checking your blood pressure and blood sugar levels.  Get screened for cancer and depression. Make sure that you are up to date with all your vaccines. This information is not intended to replace advice given to you by your health care provider. Make sure you discuss any questions you have with your health care provider. Document Released: 03/22/2005 Document Revised: 01/21/2018 Document Reviewed: 01/21/2018 Elsevier Patient Education  2020 [employer].

## 2018-11-17 NOTE — Progress Notes (Signed)
Heather Chambers [birth year] [id number]    History:    Presents for annual exam.  Postmenopausal on HRT with no bleeding.  1 abnormal Pap greater than 30 years ago with no needed treatment normal since normal mammogram history.  2011- colonoscopy.  Has had some right shoulder issues this past year has seen an orthopedist. 2019 T score -1.6 FRAX 7.5% / 0.7%.  Past medical history, past surgical history, family history and social history were all reviewed and documented in the EPIC chart.  Works at [employer] for [employer] in [place].  One son lives in [place] doing well.  History of migraines less frequent.  ROS:  A ROS was performed and pertinent positives and negatives are included.  Exam:  Vitals:   11/17/18 0938  BP: 110/78  Weight: 126 lb (57.2 kg)  Height: 5\' 3"  (1.6 m)   Body mass index is 22.32 kg/m.   General appearance:  Normal Thyroid:  Symmetrical, normal in size, without palpable masses or nodularity. Respiratory  Auscultation:  Clear without wheezing or rhonchi Cardiovascular  Auscultation:  Regular rate, without rubs, murmurs or gallops  Edema/varicosities:  Not grossly evident Abdominal  Soft,nontender, without masses, guarding or rebound.  Liver/spleen:  No organomegaly noted  Hernia:  None appreciated  Skin  Inspection:  Grossly normal   Breasts: Examined lying and sitting.     Right: Without masses, retractions, discharge or axillary adenopathy.     Left: Without masses, retractions, discharge or axillary adenopathy. Gentitourinary   Inguinal/mons:  Normal without inguinal adenopathy  External genitalia:  Normal  BUS/Urethra/Skene's glands:  Normal  Vagina:  Normal  Cervix:  Normal  Uterus:   normal in size, shape and contour.  Midline and mobile  Adnexa/parametria:     Rt: Without masses or tenderness.   Lt: Without masses or tenderness.  Anus and perineum: Normal  Digital rectal exam: Normal sphincter tone without palpated masses or  tenderness  Assessment/Plan:  63 y.o. MWF G1, P1 for annual exam with occasional hot flushes persisting.  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  SBEs, continue annual screening mammogram, calcium rich foods, vitamin D 2000 daily encouraged.  Aware of importance of regular exercises daily walking and trying to get more exercise in.  Pap normal 2017, new screening guidelines reviewed.    Conrad, 11:52 AM 11/17/2018

## 2018-11-18 LAB — URINALYSIS, COMPLETE W/RFL CULTURE
Bacteria, UA: NONE SEEN /HPF
Bilirubin Urine: NEGATIVE
Glucose, UA: NEGATIVE
Hgb urine dipstick: NEGATIVE
Hyaline Cast: NONE SEEN /LPF
Ketones, ur: NEGATIVE
Leukocyte Esterase: NEGATIVE
Nitrites, Initial: NEGATIVE
Protein, ur: NEGATIVE
RBC / HPF: NONE SEEN /HPF (ref 0–2)
Specific Gravity, Urine: 1.01 (ref 1.001–1.03)
Squamous Epithelial / HPF: NONE SEEN /HPF (ref <=5)
WBC, UA: NONE SEEN /HPF (ref 0–5)
pH: 6 (ref 5.0–8.0)

## 2018-11-18 LAB — NO CULTURE INDICATED

## 2018-12-11 ENCOUNTER — Ambulatory Visit (INDEPENDENT_AMBULATORY_CARE_PROVIDER_SITE_OTHER): Payer: PRIVATE HEALTH INSURANCE

## 2018-12-11 ENCOUNTER — Other Ambulatory Visit: Payer: Self-pay

## 2018-12-11 DIAGNOSIS — Z23 Encounter for immunization: Secondary | ICD-10-CM

## 2019-01-21 ENCOUNTER — Other Ambulatory Visit: Payer: Self-pay | Admitting: Women's Health

## 2019-02-10 ENCOUNTER — Encounter: Payer: Self-pay | Admitting: Family Medicine

## 2019-02-10 ENCOUNTER — Other Ambulatory Visit: Payer: Self-pay

## 2019-02-10 ENCOUNTER — Ambulatory Visit (INDEPENDENT_AMBULATORY_CARE_PROVIDER_SITE_OTHER): Payer: PRIVATE HEALTH INSURANCE | Admitting: Family Medicine

## 2019-02-10 VITALS — BP 119/79 | HR 68 | Temp 98.1°F | Resp 16 | Ht 63.0 in | Wt 125.4 lb

## 2019-02-10 DIAGNOSIS — Z131 Encounter for screening for diabetes mellitus: Secondary | ICD-10-CM | POA: Diagnosis not present

## 2019-02-10 DIAGNOSIS — Z79890 Hormone replacement therapy (postmenopausal): Secondary | ICD-10-CM

## 2019-02-10 DIAGNOSIS — Z23 Encounter for immunization: Secondary | ICD-10-CM

## 2019-02-10 DIAGNOSIS — Z1211 Encounter for screening for malignant neoplasm of colon: Secondary | ICD-10-CM

## 2019-02-10 DIAGNOSIS — B001 Herpesviral vesicular dermatitis: Secondary | ICD-10-CM | POA: Diagnosis not present

## 2019-02-10 DIAGNOSIS — B009 Herpesviral infection, unspecified: Secondary | ICD-10-CM | POA: Diagnosis not present

## 2019-02-10 DIAGNOSIS — G43701 Chronic migraine without aura, not intractable, with status migrainosus: Secondary | ICD-10-CM | POA: Diagnosis not present

## 2019-02-10 DIAGNOSIS — E038 Other specified hypothyroidism: Secondary | ICD-10-CM | POA: Diagnosis not present

## 2019-02-10 DIAGNOSIS — Z0000 Encounter for general adult medical examination without abnormal findings: Secondary | ICD-10-CM | POA: Diagnosis not present

## 2019-02-10 DIAGNOSIS — J3089 Other allergic rhinitis: Secondary | ICD-10-CM

## 2019-02-10 LAB — CBC
HCT: 41.7 % (ref 36.0–46.0)
Hemoglobin: 13.7 g/dL (ref 12.0–15.0)
MCHC: 32.9 g/dL (ref 30.0–36.0)
MCV: 91.4 fl (ref 78.0–100.0)
Platelets: 315 10*3/uL (ref 150.0–400.0)
RBC: 4.57 Mil/uL (ref 3.87–5.11)
RDW: 14.4 % (ref 11.5–15.5)
WBC: 6.9 10*3/uL (ref 4.0–10.5)

## 2019-02-10 LAB — LIPID PANEL
Cholesterol: 208 mg/dL — ABNORMAL HIGH (ref 0–200)
HDL: 71.9 mg/dL (ref >39.00)
LDL Cholesterol: 115 mg/dL — ABNORMAL HIGH (ref 0–99)
NonHDL: 135.82
Total CHOL/HDL Ratio: 3
Triglycerides: 106 mg/dL (ref 0.0–149.0)
VLDL: 21.2 mg/dL (ref 0.0–40.0)

## 2019-02-10 LAB — COMPREHENSIVE METABOLIC PANEL
ALT: 10 U/L (ref 0–35)
AST: 16 U/L (ref 0–37)
Albumin: 4.3 g/dL (ref 3.5–5.2)
Alkaline Phosphatase: 40 U/L (ref 39–117)
BUN: 18 mg/dL (ref 6–23)
CO2: 27 mEq/L (ref 19–32)
Calcium: 9.1 mg/dL (ref 8.4–10.5)
Chloride: 102 mEq/L (ref 96–112)
Creatinine, Ser: 0.85 mg/dL (ref 0.40–1.20)
GFR: 67.91 mL/min (ref >60.00)
Glucose, Bld: 93 mg/dL (ref 70–99)
Potassium: 4.5 mEq/L (ref 3.5–5.1)
Sodium: 136 mEq/L (ref 135–145)
Total Bilirubin: 0.5 mg/dL (ref 0.2–1.2)
Total Protein: 6.6 g/dL (ref 6.0–8.3)

## 2019-02-10 LAB — TSH: TSH: 1.72 u[IU]/mL (ref 0.35–4.50)

## 2019-02-10 LAB — HEMOGLOBIN A1C: Hgb A1c MFr Bld: 5.1 % (ref 4.6–6.5)

## 2019-02-10 LAB — T4, FREE: Free T4: 0.84 ng/dL (ref 0.60–1.60)

## 2019-02-10 MED ORDER — QNASL 80 MCG/ACT NA AERS: 1.0000 | INHALATION_SPRAY | Freq: Every day | NASAL | 11 refills | Status: DC

## 2019-02-10 MED ORDER — SUMATRIPTAN SUCCINATE 100 MG PO TABS: ORAL_TABLET | ORAL | 4 refills | Status: DC

## 2019-02-10 MED ORDER — SYNTHROID 75 MCG PO TABS: 75.0000 ug | ORAL_TABLET | Freq: Every day | ORAL | 3 refills | Status: DC

## 2019-02-10 MED ORDER — IPRATROPIUM BROMIDE 0.06 % NA SOLN: 2.0000 | Freq: Three times a day (TID) | NASAL | 11 refills | Status: DC

## 2019-02-10 MED ORDER — VALACYCLOVIR HCL 500 MG PO TABS: ORAL_TABLET | ORAL | 6 refills | Status: DC

## 2019-02-10 MED ORDER — CARBINOXAMINE MALEATE 4 MG PO TABS: 4.0000 mg | ORAL_TABLET | ORAL | 11 refills | Status: DC | PRN

## 2019-02-10 NOTE — Patient Instructions (Signed)
Health Maintenance, Female Adopting a healthy lifestyle and getting preventive care are important in promoting health and wellness. Ask your health care provider about:  The right schedule for you to have regular tests and exams.  Things you can do on your own to prevent diseases and keep yourself healthy. What should I know about diet, weight, and exercise? Eat a healthy diet   Eat a diet that includes plenty of vegetables, fruits, low-fat dairy products, and lean protein.  Do not eat a lot of foods that are high in solid fats, added sugars, or sodium. Maintain a healthy weight Body mass index (BMI) is used to identify weight problems. It estimates body fat based on height and weight. Your health care provider can help determine your BMI and help you achieve or maintain a healthy weight. Get regular exercise Get regular exercise. This is one of the most important things you can do for your health. Most adults should:  Exercise for at least 150 minutes each week. The exercise should increase your heart rate and make you sweat (moderate-intensity exercise).  Do strengthening exercises at least twice a week. This is in addition to the moderate-intensity exercise.  Spend less time sitting. Even light physical activity can be beneficial. Watch cholesterol and blood lipids Have your blood tested for lipids and cholesterol at 61 years of age, then have this test every 5 years. Have your cholesterol levels checked more often if:  Your lipid or cholesterol levels are high.  You are older than 61 years of age.  You are at high risk for heart disease. What should I know about cancer screening? Depending on your health history and family history, you may need to have cancer screening at various ages. This may include screening for:  Breast cancer.  Cervical cancer.  Colorectal cancer.  Skin cancer.  Lung cancer. What should I know about heart disease, diabetes, and high blood  pressure? Blood pressure and heart disease  High blood pressure causes heart disease and increases the risk of stroke. This is more likely to develop in people who have high blood pressure readings, are of African descent, or are overweight.  Have your blood pressure checked: ? Every 3-5 years if you are 18-39 years of age. ? Every year if you are 40 years old or older. Diabetes Have regular diabetes screenings. This checks your fasting blood sugar level. Have the screening done:  Once every three years after age 40 if you are at a normal weight and have a low risk for diabetes.  More often and at a younger age if you are overweight or have a high risk for diabetes. What should I know about preventing infection? Hepatitis B If you have a higher risk for hepatitis B, you should be screened for this virus. Talk with your health care provider to find out if you are at risk for hepatitis B infection. Hepatitis C Testing is recommended for:  Everyone born from 1945 through 1965.  Anyone with known risk factors for hepatitis C. Sexually transmitted infections (STIs)  Get screened for STIs, including gonorrhea and chlamydia, if: ? You are sexually active and are younger than 61 years of age. ? You are older than 61 years of age and your health care provider tells you that you are at risk for this type of infection. ? Your sexual activity has changed since you were last screened, and you are at increased risk for chlamydia or gonorrhea. Ask your health care provider if   you are at risk.  Ask your health care provider about whether you are at high risk for HIV. Your health care provider may recommend a prescription medicine to help prevent HIV infection. If you choose to take medicine to prevent HIV, you should first get tested for HIV. You should then be tested every 3 months for as long as you are taking the medicine. Pregnancy  If you are about to stop having your period (premenopausal) and  you may become pregnant, seek counseling before you get pregnant.  Take 400 to 800 micrograms (mcg) of folic acid every day if you become pregnant.  Ask for birth control (contraception) if you want to prevent pregnancy. Osteoporosis and menopause Osteoporosis is a disease in which the bones lose minerals and strength with aging. This can result in bone fractures. If you are 65 years old or older, or if you are at risk for osteoporosis and fractures, ask your health care provider if you should:  Be screened for bone loss.  Take a calcium or vitamin D supplement to lower your risk of fractures.  Be given hormone replacement therapy (HRT) to treat symptoms of menopause. Follow these instructions at home: Lifestyle  Do not use any products that contain nicotine or tobacco, such as cigarettes, e-cigarettes, and chewing tobacco. If you need help quitting, ask your health care provider.  Do not use street drugs.  Do not share needles.  Ask your health care provider for help if you need support or information about quitting drugs. Alcohol use  Do not drink alcohol if: ? Your health care provider tells you not to drink. ? You are pregnant, may be pregnant, or are planning to become pregnant.  If you drink alcohol: ? Limit how much you use to 0-1 drink a day. ? Limit intake if you are breastfeeding.  Be aware of how much alcohol is in your drink. In the [place] one drink equals one 12 oz bottle of beer (355 mL), one 5 oz glass of wine (148 mL), or one 1 oz glass of hard liquor (44 mL). General instructions  Schedule regular health, dental, and eye exams.  Stay current with your vaccines.  Tell your health care provider if: ? You often feel depressed. ? You have ever been abused or do not feel safe at home. Summary  Adopting a healthy lifestyle and getting preventive care are important in promoting health and wellness.  Follow your health care provider's instructions about healthy  diet, exercising, and getting tested or screened for diseases.  Follow your health care provider's instructions on monitoring your cholesterol and blood pressure. This information is not intended to replace advice given to you by your health care provider. Make sure you discuss any questions you have with your health care provider. Document Released: 08/13/2010 Document Revised: 01/21/2018 Document Reviewed: 01/21/2018 Elsevier Patient Education  2020 Elsevier Inc.  

## 2019-02-10 NOTE — Progress Notes (Signed)
This visit occurred during the SARS-CoV-2 public health emergency.  Safety protocols were in place, including screening questions prior to the visit, additional usage of staff PPE, and extensive cleaning of exam room while observing appropriate contact time as indicated for disinfecting solutions.    Patient ID: Heather Chambers, female  DOB: [birth year], 63 y.o.   MRN: [id number] Patient Care Team    Relationship Specialty Notifications Start End  Ma Hillock, DO PCP - General Family Medicine  01/09/15   Huel Cote, NP Nurse Practitioner Obstetrics and Gynecology  01/18/16   Milus Banister, MD Attending Physician Gastroenterology  01/18/16   Bobbitt, [place] Muta, MD Consulting Physician Allergy and Immunology  01/22/17     Chief Complaint  Patient presents with  . Annual Exam    Had coffee with cream and sugar this AM. Last pap 2017. Last Mammogram 2019.     Subjective:  Heather Chambers is a 63 y.o.  Female  present for CPE. All past medical history, surgical history, allergies, family history, immunizations, medications and social history were updated in the electronic medical record today. All recent labs, ED visits and hospitalizations within the last year were reviewed. She reports itchy skin/dry on arm s and legs. This is usually a sign of her thyroid level is off.   Health maintenance:  Colonoscopy: 10 year, No polys no FHx. 2011. Dr. Ardis Hughs. She would like to try cologuard if insurance allows.  Mammogram: GYN,10/2017,recent abnormal with further images ok. FHX of breast cancer.  Cervical cancer screening: 02/2015 UTD, normal. 3 year f/u, has gyn.  Immunizations: UTD flu2020and tdap due- will schedule nurse visit in about 4 weeks.  PNA series completed rpt >65. Shingrix  #1 provided today- nurse visit 3 mos for shingrix #2. Infectious disease screening: HIVand Hep C completed DEXA: Osteopenia, DEXA 2-3 years ago. Gyn completes Assistive device: none Oxygen  SKA:JGOT Patient has a Dental home. Hospitalizations/ED visits: reviewed    Depression screen [facility] 2/9 02/10/2019 01/23/2018 01/22/2017 01/18/2016 05/31/2014  Decreased Interest 0 0 0 0 0  Down, Depressed, Hopeless 0 0 0 0 0  PHQ - 2 Score 0 0 0 0 0   No flowsheet data found.  Immunization History  Administered Date(s) Administered  . Influenza Whole 11/20/2007, 11/30/2009  . Influenza,inj,Quad PF,6+ Mos 10/31/2016, 11/07/2017, 12/11/2018  . Influenza-Unspecified 10/31/2014, 10/29/2015, 10/31/2016  . Pneumococcal Conjugate-13 07/19/2015  . Pneumococcal Polysaccharide-23 02/24/2009  . Td 02/24/2009  . Zoster Recombinat (Shingrix) 02/10/2019    Past Medical History:  Diagnosis Date  . Allergy   . Arthritis   . Chronic pelvic pain in female 1995   LLQ  . Chronic rhinitis   . Health maintenance examination   . Hypothyroidism   . Menopausal symptoms   . Migraine   . Osteopenia 03/2017   T score -2.1 FRAX 7.5% / 0.7%   Allergies  Allergen Reactions  . Augmentin [Amoxicillin-Pot Clavulanate] Nausea And Vomiting  . Cefdinir Hives  . Etodolac     "LODINE"  . Phenergan [Promethazine Hcl]     Restlessness, twitching   Past Surgical History:  Procedure Laterality Date  . FOOT SURGERY Right 05/31/2016   joint replacement  . PELVIC LAPAROSCOPY     DIAG LAP   Family History  Problem Relation Age of Onset  . Allergic rhinitis Mother   . Migraines Mother   . Osteoarthritis Mother   . Breast cancer Maternal Grandmother   . Allergic rhinitis Maternal Grandmother   .  Heart disease Maternal Grandfather   . Breast cancer Sister   . Angioedema Neg Hx   . Asthma Neg Hx   . Atopy Neg Hx   . Eczema Neg Hx   . Immunodeficiency Neg Hx   . Urticaria Neg Hx    Social History   Social History Narrative   Ms. Sandall lives with her husband. She has a grown son who recently moved to [place] for a job. 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    Wears seatbelt.    Exercises 3x a week.    Smoke alarm in the home.    Feels safe in relationships   Right handed   Drinks decaf tea and coffee    Allergies as of 02/10/2019      Reactions   Augmentin [amoxicillin-pot Clavulanate] Nausea And Vomiting   Cefdinir Hives   Etodolac    "LODINE"   Phenergan [promethazine Hcl]    Restlessness, twitching      Medication List       Accurate as of February 10, 2019  2:50 PM. If you have any questions, ask your nurse or doctor.        STOP taking these medications   Fremanezumab-vfrm 225 MG/1.5ML Sosy Commonly known as: Ajovy Stopped by: Howard Pouch, DO     TAKE these medications   Carbinoxamine Maleate 4 MG Tabs Take 1 tablet (4 mg total) by mouth as needed (every 6-8 hours).   Estradiol-Norethindrone Acet 0.5-0.1 MG tablet TAKE 1 TABLET DAILY   ipratropium 0.06 % nasal spray Commonly known as: ATROVENT Place 2 sprays into both nostrils 3 (three) times daily.   Qnasl 80 MCG/ACT Aers Generic drug: Beclomethasone Dipropionate Place 1 spray into the nose daily.   SUMAtriptan 100 MG tablet Commonly known as: IMITREX TAKE 1 TABLET ONCE. MAY REPEAT IN 2 HOURS IF HEADACHE PERSISTS OR RECURS   Synthroid 75 MCG tablet Generic drug: levothyroxine Take 1 tablet (75 mcg total) by mouth daily. On an empty stomach   valACYclovir 500 MG tablet Commonly known as: VALTREX Take 1 tablet by mouth twice daily as needed.       All past medical history, surgical history, allergies, family history, immunizations andmedications were updated in the EMR today and reviewed under the history and medication portions of their EMR.     Recent Results (from the past 2160 hour(s))  Urinalysis,Complete w/RFL Culture     Status: None   Collection Time: 11/17/18 10:36 AM  Result Value Ref Range   Color, Urine YELLOW YELLOW   APPearance CLEAR CLEAR   Specific Gravity, Urine 1.010 1.001 - 1.03   pH 6.0 5.0 - 8.0   Glucose, UA  NEGATIVE NEGATIVE   Bilirubin Urine NEGATIVE NEGATIVE   Ketones, ur NEGATIVE NEGATIVE   Hgb urine dipstick NEGATIVE NEGATIVE   Protein, ur NEGATIVE NEGATIVE   Nitrites, Initial NEGATIVE NEGATIVE   Leukocyte Esterase NEGATIVE NEGATIVE   WBC, UA NONE SEEN 0 - 5 /HPF   RBC / HPF NONE SEEN 0 - 2 /HPF   Squamous Epithelial / LPF NONE SEEN < OR = 5 /HPF   Bacteria, UA NONE SEEN NONE SEEN /HPF   Hyaline Cast NONE SEEN NONE SEEN /LPF  REFLEXIVE URINE CULTURE     Status: None   Collection Time: 11/17/18 10:36 AM  Result Value Ref Range   Reflexve Urine Culture      Comment: NO CULTURE INDICATED  ROS: 14 pt review of systems performed and negative (unless mentioned in an HPI)  Objective: BP 119/79 (BP Location: Left Arm, Patient Position: Sitting, Cuff Size: Normal)   Pulse 68   Temp 98.1 F (36.7 C) (Temporal)   Resp 16   Ht '5\' 3"'  (1.6 m)   Wt 125 lb 6 oz (56.9 kg)   SpO2 100%   BMI 22.21 kg/m  Gen: Afebrile. No acute distress. Nontoxic in appearance, well-developed, well-nourished, pleasant, Caucasian female. HENT: AT. [place]. Bilateral TM visualized and normal in appearance, normal external auditory canal. MMM, no oral lesions, adequate dentition. Bilateral nares within normal limits. Throat without erythema, ulcerations or exudates.  No cough on exam, no hoarseness on exam. Eyes:Pupils Equal Round Reactive to light, Extraocular movements intact,  Conjunctiva without redness, discharge or icterus. Neck/lymp/endocrine: Supple, no lymphadenopathy, no thyromegaly CV: RRR no murmur, no edema, +2/4 P posterior tibialis pulses.  No carotid bruits. No JVD. Chest: CTAB, no wheeze, rhonchi or crackles.  Normal respiratory effort.  Good air movement. Abd: Soft.  Flat. NTND. BS present.  No masses palpated. No hepatosplenomegaly. No rebound tenderness or guarding. Skin: No rashes, purpura or petechiae. Warm and well-perfused. Skin intact. Neuro/Msk:  Normal gait. PERLA. EOMi. Alert. Oriented x3.   Cranial nerves II through XII intact. Muscle strength 5/5 upper/lower extremity. DTRs equal bilaterally. Psych: Normal affect, dress and demeanor. Normal speech. Normal thought content and judgment.    No exam data present  Assessment/plan: YARIXA LIGHTCAP is a 63 y.o. female present for CPE hypothyroidism - complaint with synthroid 75 mcg- she feels it may be abnormal level- feeling a little off. Medication will be refilled after results to mail in pharmacy.  Refills will be completed after results received. - TSH - T4, free Hormone replacement therapy (HRT) - long term hormone replacement gyn prescribed.  - CBC - Comp Met (CMET) - Lipid panel HSV infection/cold sore - stable.  - valACYclovir (VALTREX) 500 MG tablet; Take 1 tablet by mouth twice daily as needed.  Dispense: 30 tablet; Refill: 6 Chronic migraine without aura with status migrainosus, not intractable She had been referred to neurology and started on Anjovy- which worked, but insurance would not pay for it.  - Refills on Imitrex provided today for her - SUMAtriptan (IMITREX) 100 MG tablet; TAKE 1 TABLET ONCE. MAY REPEAT IN 2 HOURS IF HEADACHE PERSISTS OR RECURS  Dispense: 18 tablet; Refill: 4 Allergic rhinitis with probable nonallergic component - had been seen by allergist and would like to get scripts filled here now. Agreed to take over scripts for her.  - stable condition on medications.  - Beclomethasone Dipropionate (QNASL) 80 MCG/ACT AERS; Place 1 spray into the nose daily.  Dispense: 8.7 g; Refill: 11 - ipratropium (ATROVENT) 0.06 % nasal spray; Place 2 sprays into both nostrils 3 (three) times daily.  Dispense: 15 mL; Refill: 11 Diabetes mellitus screening - HgB A1c Colon cancer screening - Cologuard Encounter for preventive health examination Patient was encouraged to exercise greater than 150 minutes a week. Patient was encouraged to choose a diet filled with fresh fruits and vegetables, and lean meats. AVS  provided to patient today for education/recommendation on gender specific health and safety maintenance. Colonoscopy: 10 year, No polys no FHx. 2011. Dr. Ardis Hughs. She would like to try cologuard if insurance allows.  Mammogram: GYN,10/2017,recent abnormal with further images ok. FHX of breast cancer.  Cervical cancer screening: 02/2015 UTD, normal. 3 year f/u, has gyn.  Immunizations: UTD flu2020and tdap  due- will schedule nurse visit in about 4 weeks.  PNA series completed rpt >65. Shingrix  #1 provided today- nurse visit 3 mos for shingrix #2. Infectious disease screening: HIVand Hep C completed DEXA: Osteopenia, DEXA 2-3 years ago. Gyn completes  Return in about 1 year (around 02/10/2020) for CPE (30 min).  Return in 1 month for Tdap by nurse visit Return in 3 months for Shingrix No. 2.  Nurse visit.  Orders Placed This Encounter  Procedures  . Varicella-zoster vaccine IM (Shingrix)  . CBC  . Comp Met (CMET)  . HgB A1c  . Lipid panel  . TSH  . T4, free  . Cologuard     Annual CPE completed today plus additional time spent covering multiple chronic conditions and prescription refills.   Electronically signed by: Howard Pouch, DO [place]

## 2019-02-25 ENCOUNTER — Ambulatory Visit: Payer: PRIVATE HEALTH INSURANCE | Attending: Internal Medicine

## 2019-02-25 DIAGNOSIS — Z20822 Contact with and (suspected) exposure to covid-19: Secondary | ICD-10-CM

## 2019-02-26 LAB — NOVEL CORONAVIRUS, NAA: SARS-CoV-2, NAA: NOT DETECTED

## 2019-04-02 ENCOUNTER — Other Ambulatory Visit: Payer: Self-pay

## 2019-04-02 DIAGNOSIS — J3089 Other allergic rhinitis: Secondary | ICD-10-CM

## 2019-04-06 ENCOUNTER — Other Ambulatory Visit: Payer: Self-pay | Admitting: *Deleted

## 2019-04-06 DIAGNOSIS — J3089 Other allergic rhinitis: Secondary | ICD-10-CM

## 2019-04-06 NOTE — Telephone Encounter (Signed)
Denied ipratropium. Pt last seen 2019.

## 2019-04-07 ENCOUNTER — Telehealth: Payer: Self-pay

## 2019-04-07 DIAGNOSIS — J3089 Other allergic rhinitis: Secondary | ICD-10-CM

## 2019-04-07 MED ORDER — IPRATROPIUM BROMIDE 0.06 % NA SOLN: 2.0000 | Freq: Three times a day (TID) | NASAL | 10 refills | Status: DC

## 2019-04-07 NOTE — Telephone Encounter (Signed)
Pt was called and she had called pharmacy and they did not receive the refill in 01/2019. New RX was sent to pharmacy. [facility] also sent a paper refill request stating patient had no refills on file. Sent refill into pharmacy, pt aware.

## 2019-04-07 NOTE — Telephone Encounter (Signed)
Patient requesting ipratropium (ATROVENT) 0.06 % nasal spray to be sent to [facility]

## 2019-04-23 ENCOUNTER — Telehealth: Payer: Self-pay

## 2019-04-23 MED ORDER — AZELASTINE HCL 0.05 % OP SOLN: 1.0000 [drp] | Freq: Two times a day (BID) | OPHTHALMIC | 2 refills | Status: DC

## 2019-04-23 NOTE — Telephone Encounter (Signed)
RX not listed in current med list.  Please advise if this is an appropriate rx for patient to get from PCP?

## 2019-04-23 NOTE — Telephone Encounter (Signed)
I have refilled the eyedrops for her.  These drops are for allergic conjunctivitis and are not for routine every day use.  These are to be used only with itchy red eyes still to be secondary to her allergies.  The reason this is important is there are other forms of conjunctivitis which can be infectious in would not respond to the above eyedrops.  If her symptoms remain I would advise her to make an appointment for evaluation.

## 2019-04-23 NOTE — Telephone Encounter (Signed)
Patient refill request - Dr. Nunzio Cobbs used to fill this for patient, but said she thought Dr. Claiborne Billings was taking over maintenance on this prescription and she had approved this to be filled already.  I could not find anything where Dr. Claiborne Billings had prescribed this for her.  azelastine (OPTIVAR) 0.05 % ophthalmic solution [[id number]   [facility]

## 2019-04-23 NOTE — Telephone Encounter (Signed)
Patient advised of information and is aware to come into office for evaluation if symptoms are not better.  She says she just uses these intermittently when allergies are severe.

## 2019-05-15 ENCOUNTER — Ambulatory Visit: Payer: PRIVATE HEALTH INSURANCE | Attending: Internal Medicine

## 2019-05-15 DIAGNOSIS — Z23 Encounter for immunization: Secondary | ICD-10-CM

## 2019-05-15 NOTE — Progress Notes (Signed)
   Covid-19 Vaccination Clinic  Name:  Heather Chambers    MRN: [id number] DOB: 1956/01/09  05/15/2019  [name] was observed post Covid-19 immunization for 15 minutes without incident. She was provided with Vaccine Information Sheet and instruction to access the V-Safe system.   [name] was instructed to call 911 with any severe reactions post vaccine: Marland Kitchen Difficulty breathing  . Swelling of face and throat  . A fast heartbeat  . A bad rash all over body  . Dizziness and weakness   Immunizations Administered    Name Date Dose VIS Date Route   Pfizer COVID-19 Vaccine 05/15/2019 11:15 AM 0.3 mL 01/22/2019 Intramuscular   Manufacturer: [employer], [employer]   Lot: [id number]   NDC: [address]-3

## 2019-06-09 ENCOUNTER — Ambulatory Visit: Payer: PRIVATE HEALTH INSURANCE

## 2019-06-10 ENCOUNTER — Ambulatory Visit: Payer: PRIVATE HEALTH INSURANCE | Attending: Internal Medicine

## 2019-06-10 DIAGNOSIS — Z23 Encounter for immunization: Secondary | ICD-10-CM

## 2019-06-10 NOTE — Progress Notes (Signed)
   Covid-19 Vaccination Clinic  Name:  SURABHI GADEA    MRN: [id number] DOB: Sep 27, 1955  06/10/2019  Ms. Benda was observed post Covid-19 immunization for 15 minutes without incident. She was provided with Vaccine Information Sheet and instruction to access the V-Safe system.   Ms. Stander was instructed to call 911 with any severe reactions post vaccine: Marland Kitchen Difficulty breathing  . Swelling of face and throat  . A fast heartbeat  . A bad rash all over body  . Dizziness and weakness   Immunizations Administered    Name Date Dose VIS Date Route   Pfizer COVID-19 Vaccine 06/10/2019 12:54 PM 0.3 mL 04/07/2018 Intramuscular   Manufacturer: [employer], [employer]   Lot: [id number]   NDC: [address]-2

## 2019-12-02 ENCOUNTER — Ambulatory Visit (INDEPENDENT_AMBULATORY_CARE_PROVIDER_SITE_OTHER): Payer: PRIVATE HEALTH INSURANCE

## 2019-12-02 ENCOUNTER — Other Ambulatory Visit: Payer: Self-pay

## 2019-12-02 ENCOUNTER — Encounter: Payer: Self-pay | Admitting: Family Medicine

## 2019-12-02 DIAGNOSIS — Z23 Encounter for immunization: Secondary | ICD-10-CM

## 2019-12-17 ENCOUNTER — Encounter: Payer: PRIVATE HEALTH INSURANCE | Admitting: Family Medicine

## 2019-12-22 ENCOUNTER — Encounter: Payer: PRIVATE HEALTH INSURANCE | Admitting: Obstetrics and Gynecology

## 2019-12-24 ENCOUNTER — Encounter: Payer: PRIVATE HEALTH INSURANCE | Admitting: Family Medicine

## 2020-01-27 ENCOUNTER — Other Ambulatory Visit: Payer: Self-pay

## 2020-01-27 ENCOUNTER — Encounter: Payer: Self-pay | Admitting: Family Medicine

## 2020-01-27 ENCOUNTER — Ambulatory Visit (INDEPENDENT_AMBULATORY_CARE_PROVIDER_SITE_OTHER): Payer: PRIVATE HEALTH INSURANCE | Admitting: Family Medicine

## 2020-01-27 VITALS — BP 129/70 | HR 69 | Temp 98.1°F | Ht 63.0 in | Wt 124.0 lb

## 2020-01-27 DIAGNOSIS — M1990 Unspecified osteoarthritis, unspecified site: Secondary | ICD-10-CM | POA: Diagnosis not present

## 2020-01-27 DIAGNOSIS — Z0000 Encounter for general adult medical examination without abnormal findings: Secondary | ICD-10-CM

## 2020-01-27 DIAGNOSIS — Z1211 Encounter for screening for malignant neoplasm of colon: Secondary | ICD-10-CM | POA: Diagnosis not present

## 2020-01-27 DIAGNOSIS — E038 Other specified hypothyroidism: Secondary | ICD-10-CM | POA: Diagnosis not present

## 2020-01-27 DIAGNOSIS — G43701 Chronic migraine without aura, not intractable, with status migrainosus: Secondary | ICD-10-CM

## 2020-01-27 DIAGNOSIS — Z23 Encounter for immunization: Secondary | ICD-10-CM

## 2020-01-27 DIAGNOSIS — Z131 Encounter for screening for diabetes mellitus: Secondary | ICD-10-CM | POA: Diagnosis not present

## 2020-01-27 DIAGNOSIS — B009 Herpesviral infection, unspecified: Secondary | ICD-10-CM

## 2020-01-27 DIAGNOSIS — J3089 Other allergic rhinitis: Secondary | ICD-10-CM

## 2020-01-27 DIAGNOSIS — B001 Herpesviral vesicular dermatitis: Secondary | ICD-10-CM

## 2020-01-27 DIAGNOSIS — Z79890 Hormone replacement therapy (postmenopausal): Secondary | ICD-10-CM | POA: Diagnosis not present

## 2020-01-27 DIAGNOSIS — E039 Hypothyroidism, unspecified: Secondary | ICD-10-CM

## 2020-01-27 LAB — CBC WITH DIFFERENTIAL/PLATELET
Basophils Absolute: 0.1 10*3/uL (ref 0.0–0.1)
Basophils Relative: 0.8 % (ref 0.0–3.0)
Eosinophils Absolute: 0.3 10*3/uL (ref 0.0–0.7)
Eosinophils Relative: 4.2 % (ref 0.0–5.0)
HCT: 40.9 % (ref 36.0–46.0)
Hemoglobin: 13.7 g/dL (ref 12.0–15.0)
Lymphocytes Relative: 27.1 % (ref 12.0–46.0)
Lymphs Abs: 1.9 10*3/uL (ref 0.7–4.0)
MCHC: 33.4 g/dL (ref 30.0–36.0)
MCV: 91.8 fl (ref 78.0–100.0)
Monocytes Absolute: 0.4 10*3/uL (ref 0.1–1.0)
Monocytes Relative: 5.7 % (ref 3.0–12.0)
Neutro Abs: 4.3 10*3/uL (ref 1.4–7.7)
Neutrophils Relative %: 62.2 % (ref 43.0–77.0)
Platelets: 345 10*3/uL (ref 150.0–400.0)
RBC: 4.46 Mil/uL (ref 3.87–5.11)
RDW: 14.5 % (ref 11.5–15.5)
WBC: 6.9 10*3/uL (ref 4.0–10.5)

## 2020-01-27 LAB — COMPREHENSIVE METABOLIC PANEL
ALT: 10 U/L (ref 0–35)
AST: 14 U/L (ref 0–37)
Albumin: 4.6 g/dL (ref 3.5–5.2)
Alkaline Phosphatase: 37 U/L — ABNORMAL LOW (ref 39–117)
BUN: 13 mg/dL (ref 6–23)
CO2: 27 mEq/L (ref 19–32)
Calcium: 9.1 mg/dL (ref 8.4–10.5)
Chloride: 100 mEq/L (ref 96–112)
Creatinine, Ser: 0.79 mg/dL (ref 0.40–1.20)
GFR: 80.22 mL/min (ref >60.00)
Glucose, Bld: 85 mg/dL (ref 70–99)
Potassium: 4.2 mEq/L (ref 3.5–5.1)
Sodium: 134 mEq/L — ABNORMAL LOW (ref 135–145)
Total Bilirubin: 0.5 mg/dL (ref 0.2–1.2)
Total Protein: 7.3 g/dL (ref 6.0–8.3)

## 2020-01-27 LAB — LIPID PANEL
Cholesterol: 182 mg/dL (ref 0–200)
HDL: 77.2 mg/dL (ref >39.00)
LDL Cholesterol: 89 mg/dL (ref 0–99)
NonHDL: 105.17
Total CHOL/HDL Ratio: 2
Triglycerides: 83 mg/dL (ref 0.0–149.0)
VLDL: 16.6 mg/dL (ref 0.0–40.0)

## 2020-01-27 LAB — HEMOGLOBIN A1C: Hgb A1c MFr Bld: 5.1 % (ref 4.6–6.5)

## 2020-01-27 LAB — T4, FREE: Free T4: 0.88 ng/dL (ref 0.60–1.60)

## 2020-01-27 LAB — TSH: TSH: 2.81 u[IU]/mL (ref 0.35–4.50)

## 2020-01-27 MED ORDER — IPRATROPIUM BROMIDE 0.06 % NA SOLN: 2.0000 | Freq: Three times a day (TID) | NASAL | 10 refills | Status: DC

## 2020-01-27 MED ORDER — AZELASTINE HCL 0.05 % OP SOLN: 1.0000 [drp] | Freq: Two times a day (BID) | OPHTHALMIC | 2 refills | Status: DC

## 2020-01-27 MED ORDER — SUMATRIPTAN SUCCINATE 100 MG PO TABS
ORAL_TABLET | ORAL | 4 refills | Status: DC
Start: 2020-01-27 — End: 2020-11-02

## 2020-01-27 MED ORDER — VALACYCLOVIR HCL 500 MG PO TABS: ORAL_TABLET | ORAL | 6 refills | Status: DC

## 2020-01-27 MED ORDER — CARBINOXAMINE MALEATE 4 MG PO TABS: 4.0000 mg | ORAL_TABLET | ORAL | 11 refills | Status: DC | PRN

## 2020-01-27 NOTE — Progress Notes (Signed)
This visit occurred during the SARS-CoV-2 public health emergency.  Safety protocols were in place, including screening questions prior to the visit, additional usage of staff PPE, and extensive cleaning of exam room while observing appropriate contact time as indicated for disinfecting solutions.    Patient ID: Heather Chambers, female  DOB: [birth year], 64 y.o.   MRN: [id number] Patient Care Team    Relationship Specialty Notifications Start End  Natalia Leatherwood, DO PCP - General Family Medicine  01/09/15   Harrington Challenger, NP (Inactive) Nurse Practitioner Obstetrics and Gynecology  01/18/16   Rachael Fee, MD Attending Physician Gastroenterology  01/18/16   Bobbitt, Heywood Iles, MD Consulting Physician Allergy and Immunology  01/22/17     Chief Complaint  Patient presents with  . Annual Exam    Subjective: Heather Chambers is a 64 y.o.  Female  present for CPE/cmc. All past medical history, surgical history, allergies, family history, immunizations, medications and social history were updated in the electronic medical record today. All recent labs, ED visits and hospitalizations within the last year were reviewed. She reports itchy skin/dry on arm s and legs. This is usually a sign of her thyroid level is off.   Health maintenance:  Colonoscopy: 10 year, No polys no FHx. 2011. Dr. Christella Hartigan. She would like to try cologuard if insurance allows. > ordered again Mammogram: GYN,10/2017,recent abnormal with further images ok. FHX of breast cancer. > she has completed by GYN. She reports insurance only covers every other year testing.  Cervical cancer screening: 02/2015 UTD, normal. 3-5 year f/u, has gyn.  Immunizations: UTD flu2021, tdap completed today, PNA series completed rpt >65. Shingrix  #1 completed- she will schedule shingrix #2 next week by  Nurse visit. covid series and booster completed Infectious disease screening: HIVand Hep C completed DEXA: Osteopenia, DEXA 2-3 years ago. Gyn  completes Assistive device: none Oxygen HYQ:MVHQ Patient has a Dental home. Hospitalizations/ED visits: reviewed  hypothyroidism Patient reports compliance with brand Synthroid 75 mcg daily.  She does have some concerns that her insurance will not cover name brand any longer and she cannot take the generic form.  Hormone replacement therapy (HRT) - long term hormone replacement gyn prescribed.   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  She is using the Imitrex as needed.  She has some concerns about insurance pain medication again.  Allergic rhinitis with probable nonallergic component She has been seen by allergist in the past.  She has noticed an increase in her allergies.  Her insurance also will not pay for the Qnasl any longer.  She is using the Atrovent nasal spray, carbinoxamine maleate tabs and is Optivar ophthalmic solution.  Depression screen [facility] 2/9 01/27/2020 02/10/2019 01/23/2018 01/22/2017 01/18/2016  Decreased Interest 0 0 0 0 0  Down, Depressed, Hopeless 0 0 0 0 0  PHQ - 2 Score 0 0 0 0 0   No flowsheet data found.  Immunization History  Administered Date(s) Administered  . Influenza Whole 11/20/2007, 11/30/2009  . Influenza,inj,Quad PF,6+ Mos 10/31/2016, 11/07/2017, 12/11/2018, 12/02/2019  . Influenza-Unspecified 10/31/2014, 10/29/2015, 10/31/2016  . PFIZER SARS-COV-2 Vaccination 05/15/2019, 06/10/2019, 01/02/2020  . Pneumococcal Conjugate-13 07/19/2015  . Pneumococcal Polysaccharide-23 02/24/2009  . Td 02/24/2009  . Tdap 01/27/2020  . Zoster Recombinat (Shingrix) 02/10/2019    Past Medical History:  Diagnosis  Date  . Allergy   . Arthritis   . Chronic pelvic pain in female 1995   LLQ  . Chronic rhinitis   . Health maintenance examination   . Hypothyroidism   .  Menopausal symptoms   . Migraine   . Osteopenia 03/2017   T score -2.1 FRAX 7.5% / 0.7%   Allergies  Allergen Reactions  . Augmentin [Amoxicillin-Pot Clavulanate] Nausea And Vomiting  . Cefdinir Hives  . Etodolac     "LODINE"  . Phenergan [Promethazine Hcl]     Restlessness, twitching   Past Surgical History:  Procedure Laterality Date  . FOOT SURGERY Right 05/31/2016   joint replacement  . PELVIC LAPAROSCOPY     DIAG LAP   Family History  Problem Relation Age of Onset  . Allergic rhinitis Mother   . Migraines Mother   . Osteoarthritis Mother   . Breast cancer Maternal Grandmother   . Allergic rhinitis Maternal Grandmother   . Heart disease Maternal Grandfather   . Breast cancer Sister   . Angioedema Neg Hx   . Asthma Neg Hx   . Atopy Neg Hx   . Eczema Neg Hx   . Immunodeficiency Neg Hx   . Urticaria Neg Hx    Social History   Social History Narrative   Ms. Juste lives with her husband. She has a grown son who recently moved to [place] for a job. 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    Wears seatbelt.    Exercises 3x a week.    Smoke alarm in the home.    Feels safe in relationships   Right handed   Drinks decaf tea and coffee    Allergies as of 01/27/2020      Reactions   Augmentin [amoxicillin-pot Clavulanate] Nausea And Vomiting   Cefdinir Hives   Etodolac    "LODINE"   Phenergan [promethazine Hcl]    Restlessness, twitching      Medication List       Accurate as of January 27, 2020 11:59 PM. If you have any questions, ask your nurse or doctor.        azelastine 0.05 % ophthalmic solution Commonly known as: OPTIVAR Place 1 drop into both eyes 2 (two) times daily.   Carbinoxamine Maleate 4 MG Tabs Take 1 tablet (4 mg total) by mouth as needed (every 6-8 hours).   Estradiol-Norethindrone Acet 0.5-0.1 MG tablet TAKE 1 TABLET DAILY   ipratropium 0.06 % nasal spray Commonly known as:  ATROVENT Place 2 sprays into both nostrils 3 (three) times daily.   Qnasl 80 MCG/ACT Aers Generic drug: Beclomethasone Dipropionate Place 1 spray into the nose daily.   SUMAtriptan 100 MG tablet Commonly known as: IMITREX TAKE 1 TABLET ONCE. MAY REPEAT IN 2 HOURS IF HEADACHE PERSISTS OR RECURS   Synthroid 75 MCG tablet Generic drug: levothyroxine Take 1 tablet (75 mcg total) by mouth daily. On an empty stomach   valACYclovir 500 MG tablet Commonly known as: VALTREX Take 1 tablet by mouth twice daily as needed.       All past medical history, surgical history, allergies, family history, immunizations andmedications were updated in the EMR today and reviewed under the history and medication portions of their EMR.     Recent Results (from the past 2160 hour(s))  CBC with Differential/Platelet     Status: None   Collection Time: 01/27/20 10:12 AM  Result Value Ref Range  WBC 6.9 4.0 - 10.5 K/uL   RBC 4.46 3.87 - 5.11 Mil/uL   Hemoglobin 13.7 12.0 - 15.0 g/dL   HCT 28.3 15.1 - 76.1 %   MCV 91.8 78.0 - 100.0 fl   MCHC 33.4 30.0 - 36.0 g/dL   RDW 60.7 37.1 - 06.2 %   Platelets 345.0 150.0 - 400.0 K/uL   Neutrophils Relative % 62.2 43.0 - 77.0 %   Lymphocytes Relative 27.1 12.0 - 46.0 %   Monocytes Relative 5.7 3.0 - 12.0 %   Eosinophils Relative 4.2 0.0 - 5.0 %   Basophils Relative 0.8 0.0 - 3.0 %   Neutro Abs 4.3 1.4 - 7.7 K/uL   Lymphs Abs 1.9 0.7 - 4.0 K/uL   Monocytes Absolute 0.4 0.1 - 1.0 K/uL   Eosinophils Absolute 0.3 0.0 - 0.7 K/uL   Basophils Absolute 0.1 0.0 - 0.1 K/uL  Comprehensive metabolic panel     Status: Abnormal   Collection Time: 01/27/20 10:12 AM  Result Value Ref Range   Sodium 134 (Chambers) 135 - 145 mEq/Chambers   Potassium 4.2 3.5 - 5.1 mEq/Chambers   Chloride 100 96 - 112 mEq/Chambers   CO2 27 19 - 32 mEq/Chambers   Glucose, Bld 85 70 - 99 mg/dL   BUN 13 6 - 23 mg/dL   Creatinine, Ser 6.94 0.40 - 1.20 mg/dL   Total Bilirubin 0.5 0.2 - 1.2 mg/dL   Alkaline Phosphatase 37 (Chambers)  39 - 117 U/Chambers   AST 14 0 - 37 U/Chambers   ALT 10 0 - 35 U/Chambers   Total Protein 7.3 6.0 - 8.3 g/dL   Albumin 4.6 3.5 - 5.2 g/dL   GFR 85.46 >27.03 mL/min    Comment: Calculated using the CKD-EPI Creatinine Equation (2021)   Calcium 9.1 8.4 - 10.5 mg/dL  Hemoglobin J0K     Status: None   Collection Time: 01/27/20 10:12 AM  Result Value Ref Range   Hgb A1c MFr Bld 5.1 4.6 - 6.5 %    Comment: Glycemic Control Guidelines for People with Diabetes:Non Diabetic:  <6%Goal of Therapy: <7%Additional Action Suggested:  >8%   Lipid panel     Status: None   Collection Time: 01/27/20 10:12 AM  Result Value Ref Range   Cholesterol 182 0 - 200 mg/dL    Comment: ATP III Classification       Desirable:  < 200 mg/dL               Borderline High:  200 - 239 mg/dL          High:  > = 938 mg/dL   Triglycerides 18.2 0.0 - 149.0 mg/dL    Comment: Normal:  <993 mg/dLBorderline High:  150 - 199 mg/dL   HDL 71.69 >67.89 mg/dL   VLDL 38.1 0.0 - 01.7 mg/dL   LDL Cholesterol 89 0 - 99 mg/dL   Total CHOL/HDL Ratio 2     Comment:                Men          Women1/2 Average Risk     3.4          3.3Average Risk          5.0          4.42X Average Risk          9.6          7.13X Average Risk  15.0          11.0                       NonHDL 105.17     Comment: NOTE:  Non-HDL goal should be 30 mg/dL higher than patient's LDL goal (i.e. LDL goal of < 70 mg/dL, would have non-HDL goal of < 100 mg/dL)  TSH     Status: None   Collection Time: 01/27/20 10:12 AM  Result Value Ref Range   TSH 2.81 0.35 - 4.50 uIU/mL  T4, free     Status: None   Collection Time: 01/27/20 10:12 AM  Result Value Ref Range   Free T4 0.88 0.60 - 1.60 ng/dL    Comment: Specimens from patients who are undergoing biotin therapy and /or ingesting biotin supplements may contain high levels of biotin.  The higher biotin concentration in these specimens interferes with this Free T4 assay.  Specimens that contain high levels  of biotin may cause false  high results for this Free T4 assay.  Please interpret results in light of the total clinical presentation of the patient.      ROS: 14 pt review of systems performed and negative (unless mentioned in an HPI)  Objective: BP 129/70   Pulse 69   Temp 98.1 F (36.7 C) (Oral)   Ht 5\' 3"  (1.6 m)   Wt 124 lb (56.2 kg)   SpO2 100%   BMI 21.97 kg/m  Gen: Afebrile. No acute distress. Nontoxic, pleasant female.  HENT: AT. [place]. Bilateral TM visualized and normal in appearance. MMM. Bilateral nares without erythema or drainage. Throat without erythema or exudates. No cough or shortness of breath.  Eyes:Pupils Equal Round Reactive to light, Extraocular movements intact,  Conjunctiva without redness, discharge or icterus. Neck/lymp/endocrine: Supple,no lymphadenopathy, no thyromegaly CV: RRR no murmur, no edema, +2/4 P posterior tibialis pulses Chest: CTAB, no wheeze or crackles Abd: Soft. flat. NTND. BS present. no Masses palpated.  Skin: no rashes, purpura or petechiae.  Neuro:  Normal gait. PERLA. EOMi. Alert. Oriented x3. Cranial nerves II through XII intact. Muscle strength 5/5 B/E extremity. DTRs equal bilaterally. Psych: Normal affect, dress and demeanor. Normal speech. Normal thought content and judgment.    No exam data present  Assessment/plan: Heather Chambers is a 64 y.o. female present for CPE/cmc hypothyroidism - she requires name brand only Synthroid Refills will be completed after results received. If she eventually desires to use the goodrx card for name brand she will call in and we can provide her with a printed script - tsh - t4 free  Hormone replacement therapy (HRT) - long term hormone replacement gyn prescribed.  -- cbc, cmp, lipid HSV infection/cold sore - stable.  - continue valACYclovir (VALTREX) 500 MG tablet  Chronic migraine without aura with status migrainosus, not intractable She had been referred to neurology and started on Anjovy- which worked, but insurance  would not pay for it.  - continue  Imitrex PRN  Allergic rhinitis with probable nonallergic component - had been seen by allergist - she will be returning eventually since her inusrance no longer covers qnasl - continue atrovent - Continue carbinoxamine maleate - continue optivar  Encounter for preventive health examination Patient was encouraged to exercise greater than 150 minutes a week. Patient was encouraged to choose a diet filled with fresh fruits and vegetables, and lean meats. AVS provided to patient today for education/recommendation on gender specific health and safety maintenance. Colonoscopy:  10 year, No polys no FHx. 2011. Dr. Christella Hartigan. She would like to try cologuard if insurance allows. > ordered again Mammogram: GYN,10/2017,recent abnormal with further images ok. FHX of breast cancer. > she has completed by GYN. She reports insurance only covers every other year testing.  Cervical cancer screening: 02/2015 UTD, normal. 3-5 year f/u, has gyn.  Immunizations: UTD flu2021, tdap completed today, PNA series completed rpt >65. Shingrix  #1 completed- she will schedule shingrix #2 next week by  Nurse visit. covid series and booster completed Infectious disease screening: HIVand Hep C completed DEXA: Osteopenia, DEXA 2-3 years ago. Gyn completes  Return in about 1 year (around 01/26/2021) for CPE (30 min), CMC (30 min).    Orders Placed This Encounter  Procedures  . Tdap vaccine greater than or equal to 7yo IM  . CBC with Differential/Platelet  . Comprehensive metabolic panel  . Hemoglobin A1c  . Lipid panel  . TSH  . Cologuard  . T4, free   Meds ordered this encounter  Medications  . SUMAtriptan (IMITREX) 100 MG tablet    Sig: TAKE 1 TABLET ONCE. MAY REPEAT IN 2 HOURS IF HEADACHE PERSISTS OR RECURS    Dispense:  18 tablet    Refill:  4  . valACYclovir (VALTREX) 500 MG tablet    Sig: Take 1 tablet by mouth twice daily as needed.    Dispense:  30 tablet    Refill:   6  . azelastine (OPTIVAR) 0.05 % ophthalmic solution    Sig: Place 1 drop into both eyes 2 (two) times daily.    Dispense:  6 mL    Refill:  2  . ipratropium (ATROVENT) 0.06 % nasal spray    Sig: Place 2 sprays into both nostrils 3 (three) times daily.    Dispense:  15 mL    Refill:  10  . Carbinoxamine Maleate 4 MG TABS    Sig: Take 1 tablet (4 mg total) by mouth as needed (every 6-8 hours).    Dispense:  30 tablet    Refill:  11  . SYNTHROID 75 MCG tablet    Sig: Take 1 tablet (75 mcg total) by mouth daily. On an empty stomach    Dispense:  90 tablet    Refill:  3    Pt requires name brand SYNTHROID     Annual CPE completed today plus additional time spent covering multiple chronic conditions and prescription refills.   Electronically signed by: Felix Pacini, DO [facility]

## 2020-01-27 NOTE — Patient Instructions (Signed)
Health Maintenance, Female Adopting a healthy lifestyle and getting preventive care are important in promoting health and wellness. Ask your health care provider about:  The right schedule for you to have regular tests and exams.  Things you can do on your own to prevent diseases and keep yourself healthy. What should I know about diet, weight, and exercise? Eat a healthy diet   Eat a diet that includes plenty of vegetables, fruits, low-fat dairy products, and lean protein.  Do not eat a lot of foods that are high in solid fats, added sugars, or sodium. Maintain a healthy weight Body mass index (BMI) is used to identify weight problems. It estimates body fat based on height and weight. Your health care provider can help determine your BMI and help you achieve or maintain a healthy weight. Get regular exercise Get regular exercise. This is one of the most important things you can do for your health. Most adults should:  Exercise for at least 150 minutes each week. The exercise should increase your heart rate and make you sweat (moderate-intensity exercise).  Do strengthening exercises at least twice a week. This is in addition to the moderate-intensity exercise.  Spend less time sitting. Even light physical activity can be beneficial. Watch cholesterol and blood lipids Have your blood tested for lipids and cholesterol at 62 years of age, then have this test every 5 years. Have your cholesterol levels checked more often if:  Your lipid or cholesterol levels are high.  You are older than 62 years of age.  You are at high risk for heart disease. What should I know about cancer screening? Depending on your health history and family history, you may need to have cancer screening at various ages. This may include screening for:  Breast cancer.  Cervical cancer.  Colorectal cancer.  Skin cancer.  Lung cancer. What should I know about heart disease, diabetes, and high blood  pressure? Blood pressure and heart disease  High blood pressure causes heart disease and increases the risk of stroke. This is more likely to develop in people who have high blood pressure readings, are of African descent, or are overweight.  Have your blood pressure checked: ? Every 3-5 years if you are 18-39 years of age. ? Every year if you are 40 years old or older. Diabetes Have regular diabetes screenings. This checks your fasting blood sugar level. Have the screening done:  Once every three years after age 40 if you are at a normal weight and have a low risk for diabetes.  More often and at a younger age if you are overweight or have a high risk for diabetes. What should I know about preventing infection? Hepatitis B If you have a higher risk for hepatitis B, you should be screened for this virus. Talk with your health care provider to find out if you are at risk for hepatitis B infection. Hepatitis C Testing is recommended for:  Everyone born from 1945 through 1965.  Anyone with known risk factors for hepatitis C. Sexually transmitted infections (STIs)  Get screened for STIs, including gonorrhea and chlamydia, if: ? You are sexually active and are younger than 62 years of age. ? You are older than 62 years of age and your health care provider tells you that you are at risk for this type of infection. ? Your sexual activity has changed since you were last screened, and you are at increased risk for chlamydia or gonorrhea. Ask your health care provider if   you are at risk.  Ask your health care provider about whether you are at high risk for HIV. Your health care provider may recommend a prescription medicine to help prevent HIV infection. If you choose to take medicine to prevent HIV, you should first get tested for HIV. You should then be tested every 3 months for as long as you are taking the medicine. Pregnancy  If you are about to stop having your period (premenopausal) and  you may become pregnant, seek counseling before you get pregnant.  Take 400 to 800 micrograms (mcg) of folic acid every day if you become pregnant.  Ask for birth control (contraception) if you want to prevent pregnancy. Osteoporosis and menopause Osteoporosis is a disease in which the bones lose minerals and strength with aging. This can result in bone fractures. If you are 65 years old or older, or if you are at risk for osteoporosis and fractures, ask your health care provider if you should:  Be screened for bone loss.  Take a calcium or vitamin D supplement to lower your risk of fractures.  Be given hormone replacement therapy (HRT) to treat symptoms of menopause. Follow these instructions at home: Lifestyle  Do not use any products that contain nicotine or tobacco, such as cigarettes, e-cigarettes, and chewing tobacco. If you need help quitting, ask your health care provider.  Do not use street drugs.  Do not share needles.  Ask your health care provider for help if you need support or information about quitting drugs. Alcohol use  Do not drink alcohol if: ? Your health care provider tells you not to drink. ? You are pregnant, may be pregnant, or are planning to become pregnant.  If you drink alcohol: ? Limit how much you use to 0-1 drink a day. ? Limit intake if you are breastfeeding.  Be aware of how much alcohol is in your drink. In the [place] one drink equals one 12 oz bottle of beer (355 mL), one 5 oz glass of wine (148 mL), or one 1 oz glass of hard liquor (44 mL). General instructions  Schedule regular health, dental, and eye exams.  Stay current with your vaccines.  Tell your health care provider if: ? You often feel depressed. ? You have ever been abused or do not feel safe at home. Summary  Adopting a healthy lifestyle and getting preventive care are important in promoting health and wellness.  Follow your health care provider's instructions about healthy  diet, exercising, and getting tested or screened for diseases.  Follow your health care provider's instructions on monitoring your cholesterol and blood pressure. This information is not intended to replace advice given to you by your health care provider. Make sure you discuss any questions you have with your health care provider. Document Revised: 01/21/2018 Document Reviewed: 01/21/2018 Elsevier Patient Education  2020 [employer]  

## 2020-01-28 MED ORDER — SYNTHROID 75 MCG PO TABS: 75.0000 ug | ORAL_TABLET | Freq: Every day | ORAL | 3 refills | Status: DC

## 2020-02-09 ENCOUNTER — Other Ambulatory Visit: Payer: Self-pay

## 2020-02-09 ENCOUNTER — Ambulatory Visit (INDEPENDENT_AMBULATORY_CARE_PROVIDER_SITE_OTHER): Payer: PRIVATE HEALTH INSURANCE

## 2020-02-09 DIAGNOSIS — Z23 Encounter for immunization: Secondary | ICD-10-CM | POA: Diagnosis not present

## 2020-02-23 ENCOUNTER — Telehealth: Payer: Self-pay

## 2020-02-23 NOTE — Telephone Encounter (Signed)
Last visit - Annual exam. 11/17/2018 with Maryelizabeth Rowan, NP  Scheduled to see Dr. Edward Jolly 04/03/20 at 11:30am.  Requesting Refill on Estradiol-Norethindrone Acet 0.5-0.1mg  tablet.

## 2020-02-23 NOTE — Telephone Encounter (Signed)
Please request a copy of her latest mammogram.  I will need to review this to give her a refill on her HRT.

## 2020-02-24 NOTE — Telephone Encounter (Signed)
Encounter reviewed and closed.  

## 2020-02-24 NOTE — Telephone Encounter (Addendum)
I spoke with patient. Her last mammo was 2019. She will go ahead now and call for her screening mammogram appointment. She seemed to have some confusion thinking she was to only have every two years.   She stated she did not need the refill that she has a supply enough until her visit.Marland Kitchen

## 2020-03-15 ENCOUNTER — Encounter: Payer: Self-pay | Admitting: Obstetrics and Gynecology

## 2020-03-15 ENCOUNTER — Ambulatory Visit: Payer: PRIVATE HEALTH INSURANCE | Admitting: Obstetrics and Gynecology

## 2020-03-15 ENCOUNTER — Other Ambulatory Visit: Payer: Self-pay

## 2020-03-15 ENCOUNTER — Other Ambulatory Visit (HOSPITAL_COMMUNITY)
Admission: RE | Admit: 2020-03-15 | Discharge: 2020-03-15 | Disposition: A | Discharge status: Home / Self Care | Payer: 59 | Source: Ambulatory Visit | Attending: Obstetrics and Gynecology | Admitting: Obstetrics and Gynecology

## 2020-03-15 VITALS — BP 122/72 | HR 78 | Resp 14 | Ht 63.0 in | Wt 127.0 lb

## 2020-03-15 DIAGNOSIS — Z01419 Encounter for gynecological examination (general) (routine) without abnormal findings: Secondary | ICD-10-CM | POA: Insufficient documentation

## 2020-03-15 MED ORDER — ESTRADIOL-NORETHINDRONE ACET 0.5-0.1 MG PO TABS: 1.0000 | ORAL_TABLET | Freq: Every day | ORAL | 0 refills | Status: DC

## 2020-03-15 NOTE — Progress Notes (Signed)
65 y.o. G1P1 Married Caucasian female here for annual exam.    On HRT and still is having night sweats.  It helps her to sleep.   Asking about treatment for vaginal estrogen.   Works at [employer] for [employer]   PCP:   Felix Pacini, DO  Patient's last menstrual period was 02/12/2008 (approximate).           Sexually active: Yes.    The current method of family planning is post menopausal status.    Exercising: Yes.    treadmill and kettle weights Smoker:  no  Health Maintenance: Pap:  02-24-15 Neg:Neg HR HPV, 10-16-12 Neg, 07-12-10 Neg History of abnormal Pap:  Yes, possible cryotherapy over 30 years ago--normal paps since. MMG:  10-14-17 Lt.Br.poss.asymmetry,Rt.Br.Neg. Diag.Lt.w/US revealed 2 clusters of cysts 7:30 and 8 o'clock/Neg/BiRads2/screening 1 year--HAS APPT. 03/2020 Colonoscopy: 10 years ago normal--recently returned cologuard with PCP. BMD: 03-27-17  Result :Osteopenia TDaP: 01-27-20 Gardasil:   no HIV:05-31-14 NR Hep C:01-18-16 Neg Screening Labs:  PCP.  Shingrix vaccine:  Completed.  Covid vaccine and booster:  Completed.  Flu vaccine:  Completed.    reports that she has never smoked. She has never used smokeless tobacco. She reports current alcohol use of about 4.0 standard drinks of alcohol per week. She reports that she does not use drugs.  Past Medical History:  Diagnosis Date  . Allergy   . Arthritis   . Chronic pelvic pain in female 1995   LLQ  . Chronic rhinitis   . Health maintenance examination   . HSV-1 (herpes simplex virus 1) infection   . Hypothyroidism   . Menopausal symptoms   . Migraine   . Osteopenia 03/2017   T score -2.1 FRAX 7.5% / 0.7%    Past Surgical History:  Procedure Laterality Date  . FOOT SURGERY Right 05/31/2016   joint replacement  . PELVIC LAPAROSCOPY     DIAG LAP    Current Outpatient Medications  Medication Sig Dispense Refill  . azelastine (OPTIVAR) 0.05 % ophthalmic solution Place 1 drop into both eyes 2  (two) times daily. 6 mL 2  . Beclomethasone Dipropionate (QNASL) 80 MCG/ACT AERS Place 1 spray into the nose daily. 8.7 g 11  . Carbinoxamine Maleate 4 MG TABS Take 1 tablet (4 mg total) by mouth as needed (every 6-8 hours). 30 tablet 11  . ipratropium (ATROVENT) 0.06 % nasal spray Place 2 sprays into both nostrils 3 (three) times daily. 15 mL 10  . SUMAtriptan (IMITREX) 100 MG tablet TAKE 1 TABLET ONCE. MAY REPEAT IN 2 HOURS IF HEADACHE PERSISTS OR RECURS 18 tablet 4  . SYNTHROID 75 MCG tablet Take 1 tablet (75 mcg total) by mouth daily. On an empty stomach 90 tablet 3  . valACYclovir (VALTREX) 500 MG tablet Take 1 tablet by mouth twice daily as needed. 30 tablet 6  . Estradiol-Norethindrone Acet 0.5-0.1 MG tablet Take 1 tablet by mouth daily. 28 tablet 0   No current facility-administered medications for this visit.    Family History  Problem Relation Age of Onset  . Allergic rhinitis Mother   . Migraines Mother   . Osteoarthritis Mother   . Breast cancer Maternal Grandmother   . Allergic rhinitis Maternal Grandmother   . Heart disease Maternal Grandfather   . Breast cancer Sister   . Angioedema Neg Hx   . Asthma Neg Hx   . Atopy Neg Hx   . Eczema Neg Hx   . Immunodeficiency Neg Hx   .  Urticaria Neg Hx     Review of Systems  All other systems reviewed and are negative.   Exam:   BP 122/72   Pulse 78   Resp 14   Ht 5\' 3"  (1.6 m)   Wt 127 lb (57.6 kg)   LMP 02/12/2008 (Approximate)   BMI 22.50 kg/m     General appearance: alert, cooperative and appears stated age Head: normocephalic, without obvious abnormality, atraumatic Neck: no adenopathy, supple, symmetrical, trachea midline and thyroid normal to inspection and palpation Lungs: clear to auscultation bilaterally Breasts: normal appearance, no masses or tenderness, No nipple retraction or dimpling, No nipple discharge or bleeding, No axillary adenopathy Heart: regular rate and rhythm Abdomen: soft, non-tender; no  masses, no organomegaly Extremities: extremities normal, atraumatic, no cyanosis or edema Skin: skin color, texture, turgor normal. No rashes or lesions Lymph nodes: cervical, supraclavicular, and axillary nodes normal. Neurologic: grossly normal  Pelvic: External genitalia:  no lesions              No abnormal inguinal nodes palpated.              Urethra:  normal appearing urethra with no masses, tenderness or lesions              Bartholins and Skenes: normal                 Vagina: normal appearing vagina with normal color and discharge, no lesions              Cervix: no lesions              Pap taken: Yes.   Bimanual Exam:  Uterus:  normal size, contour, position, consistency, mobility, non-tender              Adnexa: no mass, fullness, tenderness              Rectal exam: Yes.  .  Confirms.              Anus:  normal sphincter tone, no lesions  Chaperone was present for exam.  Assessment:   Well woman visit with normal exam. HRT.  Osteopenia.  FH breast cancer sister (postmenopausal) and maternal grandmother.  ?HSV.   Plan: Mammogram screening discussed. Self breast awareness reviewed. Pap and HR HPV as above. Guidelines for Calcium, Vitamin D, regular exercise program including cardiovascular and weight bearing exercise. BMd ordered.  Refill of HRT for one month.  She will check on the cost of different vaginal estrogens and let me know which she prefers.  Will refill HRT and give Rx for vaginal estrogen after her mammogram is back and normal. Follow up annually and prn.

## 2020-03-15 NOTE — Patient Instructions (Signed)
Check with your insurance company about the cost of vaginal estrogen cream (Estrace, generic estradiol, and Premarin), vaginal estrogen tablets (Vagifem, Uvafem, and Imvexxy), and Estring vaginal estrogen ring.   EXERCISE AND DIET:  We recommended that you start or continue a regular exercise program for good health. Regular exercise means any activity that makes your heart beat faster and makes you sweat.  We recommend exercising at least 30 minutes per day at least 3 days a week, preferably 4 or 5.  We also recommend a diet low in fat and sugar.  Inactivity, poor dietary choices and obesity can cause diabetes, heart attack, stroke, and kidney damage, among others.    ALCOHOL AND SMOKING:  Women should limit their alcohol intake to no more than 7 drinks/beers/glasses of wine (combined, not each!) per week. Moderation of alcohol intake to this level decreases your risk of breast cancer and liver damage. And of course, no recreational drugs are part of a healthy lifestyle.  And absolutely no smoking or even second hand smoke. Most people know smoking can cause heart and lung diseases, but did you know it also contributes to weakening of your bones? Aging of your skin?  Yellowing of your teeth and nails?  CALCIUM AND VITAMIN D:  Adequate intake of calcium and Vitamin D are recommended.  The recommendations for exact amounts of these supplements seem to change often, but generally speaking 600 mg of calcium (either carbonate or citrate) and 800 units of Vitamin D per day seems prudent. Certain women may benefit from higher intake of Vitamin D.  If you are among these women, your doctor will have told you during your visit.    PAP SMEARS:  Pap smears, to check for cervical cancer or precancers,  have traditionally been done yearly, although recent scientific advances have shown that most women can have pap smears less often.  However, every woman still should have a physical exam from her gynecologist every  year. It will include a breast check, inspection of the vulva and vagina to check for abnormal growths or skin changes, a visual exam of the cervix, and then an exam to evaluate the size and shape of the uterus and ovaries.  And after 63 years of age, a rectal exam is indicated to check for rectal cancers. We will also provide age appropriate advice regarding health maintenance, like when you should have certain vaccines, screening for sexually transmitted diseases, bone density testing, colonoscopy, mammograms, etc.   MAMMOGRAMS:  All women over 74 years old should have a yearly mammogram. Many facilities now offer a "3D" mammogram, which may cost around $50 extra out of pocket. If possible,  we recommend you accept the option to have the 3D mammogram performed.  It both reduces the number of women who will be called back for extra views which then turn out to be normal, and it is better than the routine mammogram at detecting truly abnormal areas.    COLONOSCOPY:  Colonoscopy to screen for colon cancer is recommended for all women at age 104.  We know, you hate the idea of the prep.  We agree, BUT, having colon cancer and not knowing it is worse!!  Colon cancer so often starts as a polyp that can be seen and removed at colonscopy, which can quite literally save your life!  And if your first colonoscopy is normal and you have no family history of colon cancer, most women don't have to have it again for 10 years.  Once every ten years, you can do something that may end up saving your life, right?  We will be happy to help you get it scheduled when you are ready.  Be sure to check your insurance coverage so you understand how much it will cost.  It may be covered as a preventative service at no cost, but you should check your particular policy.

## 2020-03-17 LAB — COLOGUARD
COLOGUARD: NEGATIVE
Cologuard: NEGATIVE

## 2020-03-17 LAB — CYTOLOGY - PAP
Adequacy: ABSENT
Comment: NEGATIVE
Diagnosis: NEGATIVE
High risk HPV: NEGATIVE

## 2020-03-17 LAB — EXTERNAL GENERIC LAB PROCEDURE: COLOGUARD: NEGATIVE

## 2020-03-21 ENCOUNTER — Other Ambulatory Visit: Payer: Self-pay | Admitting: Obstetrics and Gynecology

## 2020-03-21 DIAGNOSIS — Z1231 Encounter for screening mammogram for malignant neoplasm of breast: Secondary | ICD-10-CM

## 2020-03-23 ENCOUNTER — Telehealth: Payer: Self-pay | Admitting: Family Medicine

## 2020-03-23 ENCOUNTER — Telehealth: Payer: Self-pay | Admitting: *Deleted

## 2020-03-23 DIAGNOSIS — G43701 Chronic migraine without aura, not intractable, with status migrainosus: Secondary | ICD-10-CM

## 2020-03-23 NOTE — Telephone Encounter (Signed)
Noted  

## 2020-03-23 NOTE — Telephone Encounter (Signed)
Patient requesting new neurology referral to [facility], Dr. Lucia Gaskins. States previous referral has expired.

## 2020-03-23 NOTE — Telephone Encounter (Signed)
Patient returned call for test results. I relayed Dr. Alan Ripper message that her Cologuard was negative. Patient voiced understanding.

## 2020-03-23 NOTE — Telephone Encounter (Signed)
New referral placed. Referral last sent 01/2017

## 2020-03-23 NOTE — Telephone Encounter (Signed)
FYI

## 2020-03-23 NOTE — Telephone Encounter (Signed)
Noted patient was on wait list and her appt is currently scheduled for end of March. This will be over 3 years since patient has been seen. She needs new referral from primary care. I called pt and LVM advising her of this and left our office number for call back if needed.

## 2020-04-03 ENCOUNTER — Telehealth: Payer: Self-pay

## 2020-04-03 NOTE — Telephone Encounter (Signed)
Patient's insurance will no longer cover SYNTHROID.  Patient is going to go thru [facility].  Dr. Claiborne Billings told patient she would write a hard copy prescription for Synthroid (brand name only) so she could take to pharmacy of her choice with [facility] card. Patient aware Dr. Claiborne Billings out of office today.  Please call patient when ready to pick up. Patient can be reached at [phone].

## 2020-04-03 NOTE — Telephone Encounter (Signed)
Spoke with pt and informed her to look on [facility] and find which pharmacy she finds to be cheaper and have that pharmacy transfer [facility] from [facility]

## 2020-04-16 ENCOUNTER — Other Ambulatory Visit: Payer: Self-pay | Admitting: Family Medicine

## 2020-04-16 DIAGNOSIS — J3089 Other allergic rhinitis: Secondary | ICD-10-CM

## 2020-04-25 ENCOUNTER — Telehealth: Payer: Self-pay

## 2020-04-25 NOTE — Telephone Encounter (Signed)
Patient refill request  SYNTHROID 75 MCG tablet [[id number]]    New pharmacy: [facility] [place], [place] Phone [phone]

## 2020-04-25 NOTE — Telephone Encounter (Signed)
Spoke with pt to have rx transferred by [facility] from [facility]

## 2020-04-27 ENCOUNTER — Ambulatory Visit: Payer: PRIVATE HEALTH INSURANCE | Admitting: Neurology

## 2020-04-27 ENCOUNTER — Encounter: Payer: Self-pay | Admitting: Neurology

## 2020-04-27 VITALS — BP 132/82 | HR 72 | Ht 63.5 in | Wt 128.0 lb

## 2020-04-27 DIAGNOSIS — G43701 Chronic migraine without aura, not intractable, with status migrainosus: Secondary | ICD-10-CM | POA: Diagnosis not present

## 2020-04-27 MED ORDER — AJOVY 225 MG/1.5ML ~~LOC~~ SOAJ
225.0000 mg | SUBCUTANEOUS | 11 refills | Status: DC
Start: 2020-04-27 — End: 2020-11-02

## 2020-04-27 MED ORDER — ONDANSETRON 4 MG PO TBDP: 4.0000 mg | ORAL_TABLET | Freq: Three times a day (TID) | ORAL | 3 refills | Status: DC | PRN

## 2020-04-27 MED ORDER — RIZATRIPTAN BENZOATE 10 MG PO TBDP: 10.0000 mg | ORAL_TABLET | ORAL | 11 refills | Status: DC | PRN

## 2020-04-27 NOTE — Progress Notes (Addendum)
[facility]    Provider:  Dr Lucia Gaskins Referring Provider: Natalia Leatherwood, DO Primary Care Physician:  Natalia Leatherwood, DO  CC:  Migraines  Interval history: Patient did great on Ajovy but could not get it approved. Will try again. Failed multiple medications, she [facility] and failed at least 3 classes: amitriptyline(side effects),verapamil(hypotension), propranolol contraindicated due to hypotension, topiramate(side effects), Aimovig gave her constipation. Ajovy was better. Tried sumatriptan, maxalt.   HPI:  Heather Chambers is a 65 y.o. female here as a referral from Dr. Claiborne Billings for recurrent migraines. She went to the [facility] center for years and tried and failed multiple medications. Migraines started in college. It was so painful she thought she had a brain tumor. Mother has migraines with aura and vision loss but less so headaches. Patient has severe headaches. She went to the headache wellness center over 10 years ago. For 3 years she went every 3 months, they tried multiple medications and failed multiple medications, the medication caused lots of GI problems.  Imitrex is the only thing that works. Triggered by tension and stress in the evenings. Migraines start in the evenings and she wakes up overnight. It starts at night then it is severe in the morning. Always towards the end of the week. Can last up to 4 days before resolving. Pain can be severe. Imitrex makes her "fuzzy". They are on the right side, also sinus issues may contribute. The migraines are pounding, throbbing with light and sound sensitivity. She has 8 migraine days a month and 15 headache days a month total. No medication.  She has had an MRI in the past and it was negative.    Reviewed notes, labs and imaging from outside physicians, which showed:  TSH 0.31 (follows with pcp), bmp, cbc, hgba1c normal  Reviewed report CT sinuses 07/2004: Clinical Data: Pain  under eyes and above left eye on and off for months.   LIMITED CT OF PARANASAL SINUSES - 07/16/04:   Technique: Limited coronal CT images were obtained through the paranasal sinuses without intravenous contrast.  Findings: Imaged paranasal sinuses are clear. No bony sclerosis or thickening to suggest chronic sinusitis. Imaged intracranial contents appear normal. Imaged appearance of the orbits is normal.  IMPRESSION:   Negative study.   Provider: Fredderick Severance  ENT CT 09/2015: reviewed report   FINDINGS: SINUSES: Mild LEFT maxillary sinus mucosal thickening along the floor. Ethmoid, sphenoid and frontal sinuses are well aerated. LEFT onodi air cell. Bilateral frontal sinus air cells. LEFT concha lamella. No abnormal antral expansion or atresia, bony wall thickening/mucoperiosteal reaction. Nasal septum is midline.  OSTIA: RIGHT ostiomeatal unit, frontal and sphenoid ethmoidal recesses are patent. Soft tissue opacifies LEFT ostiomeatal unit.  FACIAL BONES: No acute facial fracture. No destructive bony lesions.  ORBITS: Ocular globes and orbital contents are unremarkable.  IMPRESSION: Mild LEFT maxillary sinus mucosal thickening and soft tissue effacement LEFT ostiomeatal unit.   Review of Systems: Patient complains of symptoms per HPI as well as the following symptoms: headache. Pertinent negatives and positives per HPI. All others negative    Social History   Socioeconomic History  . Marital status: Married    Spouse name: Not on file  . Number of children: 1  . Years of education: Not on file  . Highest education level: Master's degree (e.g., MA, MS, MEng, MEd, MSW, MBA)  Occupational History    Employer: [employer]  Tobacco Use  .  Smoking status: Never Smoker  . Smokeless tobacco: Never Used  Vaping Use  . Vaping Use: Never used  Substance and Sexual Activity  . Alcohol use: Yes    Alcohol/week: 4.0 standard drinks    Types: 4 Cans of beer  per week    Comment: wine and beer  . Drug use: No  . Sexual activity: Yes    Birth control/protection: Post-menopausal  Other Topics Concern  . Not on file  Social History Narrative   Ms. Buskey lives with her husband. She has a grown son who recently moved to [place] for a job. 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    Wears seatbelt.    Exercises 3x a week.    Smoke alarm in the home.    Feels safe in relationships   Right handed   Drinks decaf tea and coffee   Social Determinants of Health   Financial Resource Strain: Not on file  Food Insecurity: Not on file  Transportation Needs: Not on file  Physical Activity: Not on file  Stress: Not on file  Social Connections: Not on file  Intimate Partner Violence: Not on file    Family History  Problem Relation Age of Onset  . Allergic rhinitis Mother   . Migraines Mother   . Osteoarthritis Mother   . Breast cancer Maternal Grandmother   . Allergic rhinitis Maternal Grandmother   . Heart disease Maternal Grandfather   . Breast cancer Sister   . Angioedema Neg Hx   . Asthma Neg Hx   . Atopy Neg Hx   . Eczema Neg Hx   . Immunodeficiency Neg Hx   . Urticaria Neg Hx     Past Medical History:  Diagnosis Date  . Allergy   . Arthritis   . Chronic pelvic pain in female 1995   LLQ  . Chronic rhinitis   . Health maintenance examination   . HSV-1 (herpes simplex virus 1) infection   . Hypothyroidism   . Menopausal symptoms   . Migraine   . Osteopenia 03/2017   T score -2.1 FRAX 7.5% / 0.7%    Past Surgical History:  Procedure Laterality Date  . FOOT SURGERY Right 05/31/2016   joint replacement  . PELVIC LAPAROSCOPY     DIAG LAP    Current Outpatient Medications  Medication Sig Dispense Refill  . azelastine (OPTIVAR) 0.05 % ophthalmic solution Place 1 drop into both eyes 2 (two) times daily. 6 mL 2  . Beclomethasone Dipropionate (QNASL) 80 MCG/ACT AERS Place 1 spray  into the nose daily. 8.7 g 11  . Carbinoxamine Maleate 4 MG TABS Take 1 tablet (4 mg total) by mouth as needed (every 6-8 hours). 30 tablet 11  . Estradiol-Norethindrone Acet 0.5-0.1 MG tablet Take 1 tablet by mouth daily. 28 tablet 0  . Fremanezumab-vfrm (AJOVY) 225 MG/1.5ML SOAJ Inject 225 mg into the skin every 30 (thirty) days. 1.5 mL 11  . ipratropium (ATROVENT) 0.06 % nasal spray Place 2 sprays into both nostrils 3 (three) times daily. 15 mL 10  . ondansetron (ZOFRAN-ODT) 4 MG disintegrating tablet Take 1-2 tablets (4-8 mg total) by mouth every 8 (eight) hours as needed for nausea. 30 tablet 3  . rizatriptan (MAXALT-MLT) 10 MG disintegrating tablet Take 1 tablet (10 mg total) by mouth as needed for migraine. May repeat in 2 hours if needed 9 tablet 11  . SUMAtriptan (IMITREX) 100 MG tablet TAKE 1 TABLET  ONCE. MAY REPEAT IN 2 HOURS IF HEADACHE PERSISTS OR RECURS 18 tablet 4  . SYNTHROID 75 MCG tablet Take 1 tablet (75 mcg total) by mouth daily. On an empty stomach 90 tablet 3  . valACYclovir (VALTREX) 500 MG tablet Take 1 tablet by mouth twice daily as needed. 30 tablet 6   No current facility-administered medications for this visit.    Allergies as of 04/27/2020 - Review Complete 04/27/2020  Allergen Reaction Noted  . Augmentin [amoxicillin-pot clavulanate] Nausea And Vomiting 05/30/2015  . Cefdinir Hives 11/20/2007  . Etodolac  11/20/2007  . Phenergan [promethazine hcl]  09/13/2012    Vitals: BP 132/82 (BP Location: Right Arm, Patient Position: Sitting)   Pulse 72   Ht 5' 3.5" (1.613 m)   Wt 128 lb (58.1 kg)   LMP 02/12/2008 (Approximate)   SpO2 97%   BMI 22.32 kg/m  Last Weight:  Wt Readings from Last 1 Encounters:  04/27/20 128 lb (58.1 kg)   Last Height:   Ht Readings from Last 1 Encounters:  04/27/20 5' 3.5" (1.613 m)   Physical exam: Exam: Gen: NAD, conversant, well nourised, obese, well groomed                     CV: RRR, no MRG. No Carotid Bruits. No  peripheral edema, warm, nontender Eyes: Conjunctivae clear without exudates or hemorrhage  Neuro: Detailed Neurologic Exam  Speech:    Speech is normal; fluent and spontaneous with normal comprehension.  Cognition:    The patient is oriented to person, place, and time;     recent and remote memory intact;     language fluent;     normal attention, concentration,     fund of knowledge Cranial Nerves:    The pupils are equal, round, and reactive to light. The fundi are normal and spontaneous venous pulsations are present. Visual fields are full to finger confrontation. Extraocular movements are intact. Trigeminal sensation is intact and the muscles of mastication are normal. The face is symmetric. The palate elevates in the midline. Hearing intact. Voice is normal. Shoulder shrug is normal. The tongue has normal motion without fasciculations.   Coordination:    Normal finger to nose and heel to shin. Normal rapid alternating movements.   Gait:    Heel-toe and tandem gait are normal.   Motor Observation:    No asymmetry, no atrophy, and no involuntary movements noted. Tone:    Normal muscle tone.    Posture:    Posture is normal. normal erect    Strength:    Strength is V/V in the upper and lower limbs.      Sensation: intact to LT     Reflex Exam:  DTR's:    Deep tendon reflexes in the upper and lower extremities are normal bilaterally.   Toes:    The toes are downgoing bilaterally.   Clonus:    Clonus is absent.   Assessment/Plan:  65 year old, chronic migraines, did great on Ajovy, will try it again.   - Start Ajovy for prevention - Acute management: Rizatriptan. Please take one tablet at the onset of your headache. If it does not improve the symptoms please take one additional tablet. Do not take more then 2 tablets in 24hrs. Do not take use more then 2 to 3 times in a week. Take with ondansetron.   Meds ordered this encounter  Medications  . rizatriptan  (MAXALT-MLT) 10 MG disintegrating tablet    Sig:  Take 1 tablet (10 mg total) by mouth as needed for migraine. May repeat in 2 hours if needed    Dispense:  9 tablet    Refill:  11  . ondansetron (ZOFRAN-ODT) 4 MG disintegrating tablet    Sig: Take 1-2 tablets (4-8 mg total) by mouth every 8 (eight) hours as needed for nausea.    Dispense:  30 tablet    Refill:  3  . Fremanezumab-vfrm (AJOVY) 225 MG/1.5ML SOAJ    Sig: Inject 225 mg into the skin every 30 (thirty) days.    Dispense:  1.5 mL    Refill:  11    Patient has copay card; she can have medication  regardless of insurance approval or copay amount.   Discussed: To prevent or relieve headaches, try the following: Cool Compress. Lie down and place a cool compress on your head.  Avoid headache triggers. If certain foods or odors seem to have triggered your migraines in the past, avoid them. A headache diary might help you identify triggers.  Include physical activity in your daily routine. Try a daily walk or other moderate aerobic exercise.  Manage stress. Find healthy ways to cope with the stressors, such as delegating tasks on your to-do list.  Practice relaxation techniques. Try deep breathing, yoga, massage and visualization.  Eat regularly. Eating regularly scheduled meals and maintaining a healthy diet might help prevent headaches. Also, drink plenty of fluids.  Follow a regular sleep schedule. Sleep deprivation might contribute to headaches Consider biofeedback. With this mind-body technique, you learn to control certain bodily functions -- such as muscle tension, heart rate and blood pressure -- to prevent headaches or reduce headache pain.    Proceed to emergency room if you experience new or worsening symptoms or symptoms do not resolve, if you have new neurologic symptoms or if headache is severe, or for any concerning symptom.   Provided education and documentation from American headache Society toolbox including articles  on: chronic migraine medication overuse headache, chronic migraines, prevention of migraines, behavioral and other nonpharmacologic treatments for headache.    Naomie Dean, MD  [facility] [address] [place], [place] [address]  Phone [phone] Fax [phone]  I spent 45 minutes of face-to-face and non-face-to-face time with patient on the  1. Chronic migraine without aura with status migrainosus, not intractable    diagnosis.  This included previsit chart review, lab review, study review, order entry, electronic health record documentation, patient education on the different diagnostic and therapeutic options, counseling and coordination of care, risks and benefits of management, compliance, or risk factor reduction

## 2020-04-27 NOTE — Patient Instructions (Signed)
- Start Ajovy for prevention - Acute management: Please take one tablet at the onset of your headache. If it does not improve the symptoms please take one additional tablet. Do not take more then 2 tablets in 24hrs. Do not take use more then 2 to 3 times in a week. Take with ondansetron.   Ondansetron oral dissolving tablet What is this medicine? ONDANSETRON (on DAN se tron) is used to treat nausea and vomiting caused by chemotherapy. It is also used to prevent or treat nausea and vomiting after surgery. This medicine may be used for other purposes; ask your health care provider or pharmacist if you have questions. COMMON BRAND NAME(S): Zofran ODT What should I tell my health care provider before I take this medicine? They need to know if you have any of these conditions:  heart disease  history of irregular heartbeat  liver disease  low levels of magnesium or potassium in the blood  an unusual or allergic reaction to ondansetron, granisetron, other medicines, foods, dyes, or preservatives  pregnant or trying to get pregnant  breast-feeding How should I use this medicine? These tablets are made to dissolve in the mouth. Do not try to push the tablet through the foil backing. With dry hands, peel away the foil backing and gently remove the tablet. Place the tablet in the mouth and allow it to dissolve, then swallow. While you may take these tablets with water, it is not necessary to do so. Talk to your pediatrician regarding the use of this medicine in children. Special care may be needed. Overdosage: If you think you have taken too much of this medicine contact a poison control center or emergency room at once. NOTE: This medicine is only for you. Do not share this medicine with others. What if I miss a dose? If you miss a dose, take it as soon as you can. If it is almost time for your next dose, take only that dose. Do not take double or extra doses. What may interact with this  medicine? Do not take this medicine with any of the following medications:  apomorphine  certain medicines for fungal infections like fluconazole, itraconazole, ketoconazole, posaconazole, voriconazole  cisapride  dronedarone  pimozide  thioridazine This medicine may also interact with the following medications:  carbamazepine  certain medicines for depression, anxiety, or psychotic disturbances  fentanyl  linezolid  MAOIs like Carbex, Eldepryl, Marplan, Nardil, and Parnate  methylene blue (injected into a vein)  other medicines that prolong the QT interval (cause an abnormal heart rhythm) like dofetilide, ziprasidone  phenytoin  rifampicin  tramadol This list may not describe all possible interactions. Give your health care provider a list of all the medicines, herbs, non-prescription drugs, or dietary supplements you use. Also tell them if you smoke, drink alcohol, or use illegal drugs. Some items may interact with your medicine. What should I watch for while using this medicine? Check with your doctor or health care professional as soon as you can if you have any sign of an allergic reaction. What side effects may I notice from receiving this medicine? Side effects that you should report to your doctor or health care professional as soon as possible:  allergic reactions like skin rash, itching or hives, swelling of the face, lips, or tongue  breathing problems  confusion  dizziness  fast or irregular heartbeat  feeling faint or lightheaded, falls  fever and chills  loss of balance or coordination  seizures  sweating  swelling  of the hands and feet  tightness in the chest  tremors  unusually weak or tired Side effects that usually do not require medical attention (report to your doctor or health care professional if they continue or are bothersome):  constipation or diarrhea  headache This list may not describe all possible side effects. Call  your doctor for medical advice about side effects. You may report side effects to FDA at 1-800-FDA-1088. Where should I keep my medicine? Keep out of the reach of children. Store between 2 and 30 degrees C (36 and 86 degrees F). Throw away any unused medicine after the expiration date. NOTE: This sheet is a summary. It may not cover all possible information. If you have questions about this medicine, talk to your doctor, pharmacist, or health care provider.  2021 Elsevier/Gold Standard (2018-01-20 07:14:10) Rizatriptan disintegrating tablets What is this medicine? RIZATRIPTAN (rye za TRIP tan) is used to treat migraines with or without aura. An aura is a strange feeling or visual disturbance that warns you of an attack. It is not used to prevent migraines. This medicine may be used for other purposes; ask your health care provider or pharmacist if you have questions. COMMON BRAND NAME(S): Maxalt-MLT What should I tell my health care provider before I take this medicine? They need to know if you have any of these conditions:  cigarette smoker  circulation problems in fingers and toes  diabetes  heart disease  high blood pressure  high cholesterol  history of irregular heartbeat  history of stroke  kidney disease  liver disease  stomach or intestine problems  an unusual or allergic reaction to rizatriptan, other medicines, foods, dyes, or preservatives  pregnant or trying to get pregnant  breast-feeding How should I use this medicine? Take this medicine by mouth. Follow the directions on the prescription label. Leave the tablet in the sealed blister pack until you are ready to take it. With dry hands, open the blister and gently remove the tablet. If the tablet breaks or crumbles, throw it away and take a new tablet out of the blister pack. Place the tablet in the mouth and allow it to dissolve, and then swallow. Do not cut, crush, or chew this medicine. You do not need water  to take this medicine. Do not take it more often than directed. Talk to your pediatrician regarding the use of this medicine in children. While this drug may be prescribed for children as young as 6 years for selected conditions, precautions do apply. Overdosage: If you think you have taken too much of this medicine contact a poison control center or emergency room at once. NOTE: This medicine is only for you. Do not share this medicine with others. What if I miss a dose? This does not apply. This medicine is not for regular use. What may interact with this medicine? Do not take this medicine with any of the following medicines:  certain medicines for migraine headache like almotriptan, eletriptan, frovatriptan, naratriptan, rizatriptan, sumatriptan, zolmitriptan  ergot alkaloids like dihydroergotamine, ergonovine, ergotamine, methylergonovine  MAOIs like Carbex, Eldepryl, Marplan, Nardil, and Parnate This medicine may also interact with the following medications:  certain medicines for depression, anxiety, or psychotic disorders  propranolol This list may not describe all possible interactions. Give your health care provider a list of all the medicines, herbs, non-prescription drugs, or dietary supplements you use. Also tell them if you smoke, drink alcohol, or use illegal drugs. Some items may interact with your medicine. What  should I watch for while using this medicine? Visit your healthcare professional for regular checks on your progress. Tell your healthcare professional if your symptoms do not start to get better or if they get worse. You may get drowsy or dizzy. Do not drive, use machinery, or do anything that needs mental alertness until you know how this medicine affects you. Do not stand up or sit up quickly, especially if you are an older patient. This reduces the risk of dizzy or fainting spells. Alcohol may interfere with the effect of this medicine. Your mouth may get dry.  Chewing sugarless gum or sucking hard candy and drinking plenty of water may help. Contact your healthcare professional if the problem does not go away or is severe. If you take migraine medicines for 10 or more days a month, your migraines may get worse. Keep a diary of headache days and medicine use. Contact your healthcare professional if your migraine attacks occur more frequently. What side effects may I notice from receiving this medicine? Side effects that you should report to your doctor or health care professional as soon as possible:  allergic reactions like skin rash, itching or hives, swelling of the face, lips, or tongue  chest pain or chest tightness  signs and symptoms of a dangerous change in heartbeat or heart rhythm like chest pain; dizziness; fast, irregular heartbeat; palpitations; feeling faint or lightheaded; falls; breathing problems  signs and symptoms of a stroke like changes in vision; confusion; trouble speaking or understanding; severe headaches; sudden numbness or weakness of the face, arm or leg; trouble walking; dizziness; loss of balance or coordination  signs and symptoms of serotonin syndrome like irritable; confusion; diarrhea; fast or irregular heartbeat; muscle twitching; stiff muscles; trouble walking; sweating; high fever; seizures; chills; vomiting Side effects that usually do not require medical attention (report to your doctor or health care professional if they continue or are bothersome):  diarrhea  dizziness  drowsiness  dry mouth  headache  nausea, vomiting  pain, tingling, numbness in the hands or feet  stomach pain This list may not describe all possible side effects. Call your doctor for medical advice about side effects. You may report side effects to FDA at 1-800-FDA-1088. Where should I keep my medicine? Keep out of the reach of children. Store at room temperature between 15 and 30 degrees C (59 and 86 degrees F). Protect from  light and moisture. Throw away any unused medicine after the expiration date. NOTE: This sheet is a summary. It may not cover all possible information. If you have questions about this medicine, talk to your doctor, pharmacist, or health care provider.  2021 Elsevier/Gold Standard (2017-08-12 14:58:08) Vernell Barrier injection What is this medicine? FREMANEZUMAB (fre ma NEZ ue mab) is used to prevent migraine headaches. This medicine may be used for other purposes; ask your health care provider or pharmacist if you have questions. COMMON BRAND NAME(S): AJOVY What should I tell my health care provider before I take this medicine? They need to know if you have any of these conditions:  an unusual or allergic reaction to fremanezumab, other medicines, foods, dyes, or preservatives  pregnant or trying to get pregnant  breast-feeding How should I use this medicine? This medicine is for injection under the skin. You will be taught how to prepare and give this medicine. Use exactly as directed. Take your medicine at regular intervals. Do not take your medicine more often than directed. It is important that you put your used  needles and syringes in a special sharps container. Do not put them in a trash can. If you do not have a sharps container, call your pharmacist or healthcare provider to get one. Talk to your pediatrician regarding the use of this medicine in children. Special care may be needed. Overdosage: If you think you have taken too much of this medicine contact a poison control center or emergency room at once. NOTE: This medicine is only for you. Do not share this medicine with others. What if I miss a dose? If you miss a dose, take it as soon as you can. If it is almost time for your next dose, take only that dose. Do not take double or extra doses. What may interact with this medicine? Interactions are not expected. This list may not describe all possible interactions. Give your  health care provider a list of all the medicines, herbs, non-prescription drugs, or dietary supplements you use. Also tell them if you smoke, drink alcohol, or use illegal drugs. Some items may interact with your medicine. What should I watch for while using this medicine? Tell your doctor or healthcare professional if your symptoms do not start to get better or if they get worse. What side effects may I notice from receiving this medicine? Side effects that you should report to your doctor or health care professional as soon as possible:  allergic reactions like skin rash, itching or hives, swelling of the face, lips, or tongue Side effects that usually do not require medical attention (report these to your doctor or health care professional if they continue or are bothersome):  pain, redness, or irritation at site where injected This list may not describe all possible side effects. Call your doctor for medical advice about side effects. You may report side effects to FDA at 1-800-FDA-1088. Where should I keep my medicine? Keep out of the reach of children. You will be instructed on how to store this medicine. Throw away any unused medicine after the expiration date on the label. NOTE: This sheet is a summary. It may not cover all possible information. If you have questions about this medicine, talk to your doctor, pharmacist, or health care provider.  2021 Elsevier/Gold Standard (2016-10-28 17:22:56)

## 2020-05-01 NOTE — Addendum Note (Signed)
Addended by: Naomie Dean B on: 05/01/2020 10:38 AM   Modules accepted: Level of Service

## 2020-05-03 ENCOUNTER — Telehealth: Payer: Self-pay | Admitting: *Deleted

## 2020-05-03 NOTE — Telephone Encounter (Signed)
We received a fax from Rx benefits.  Ajovy was approved from 05/03/2020 to 05/02/2021. I faxed the letter to the pt's pharmacy. Received a receipt of confirmation.

## 2020-05-03 NOTE — Telephone Encounter (Signed)
Completed Ajovy PA on Cover My Meds. Key: [id number]. Awaiting determination from [employer] within 1-5 business days.

## 2020-05-09 ENCOUNTER — Other Ambulatory Visit: Payer: Self-pay

## 2020-05-09 ENCOUNTER — Ambulatory Visit
Admission: RE | Admit: 2020-05-09 | Discharge: 2020-05-09 | Disposition: A | Discharge status: Home / Self Care | Payer: PRIVATE HEALTH INSURANCE | Source: Ambulatory Visit | Attending: Obstetrics and Gynecology | Admitting: Obstetrics and Gynecology

## 2020-05-09 DIAGNOSIS — Z1231 Encounter for screening mammogram for malignant neoplasm of breast: Secondary | ICD-10-CM

## 2020-05-10 ENCOUNTER — Ambulatory Visit: Payer: PRIVATE HEALTH INSURANCE | Admitting: Neurology

## 2020-05-11 ENCOUNTER — Encounter: Payer: Self-pay | Admitting: Gastroenterology

## 2020-05-11 ENCOUNTER — Other Ambulatory Visit: Payer: Self-pay | Admitting: Obstetrics and Gynecology

## 2020-05-11 DIAGNOSIS — R928 Other abnormal and inconclusive findings on diagnostic imaging of breast: Secondary | ICD-10-CM

## 2020-05-22 ENCOUNTER — Other Ambulatory Visit: Payer: Self-pay | Admitting: Obstetrics and Gynecology

## 2020-05-22 NOTE — Telephone Encounter (Signed)
Please ask her if it is ok if we wait until her final imaging is ready before she continues with her HRT.

## 2020-05-22 NOTE — Telephone Encounter (Signed)
Medication refill request: estradiol-norethindrone acet 0.5-0.1 mg tablet Last AEX:  03-15-2020 Next AEX: not scheduled Last MMG (if hormonal medication request): 05-09-2020 category c density birads 0: incomplete. 05-31-2020 mmg diag left breas & u/s breast left axilla scheduled  Refill authorized: please approve 1 pack until mmg is done if appropriate

## 2020-05-25 ENCOUNTER — Other Ambulatory Visit: Payer: Self-pay

## 2020-05-25 MED ORDER — ESTRADIOL-NORETHINDRONE ACET 0.5-0.1 MG PO TABS: 1.0000 | ORAL_TABLET | Freq: Every day | ORAL | 0 refills | Status: DC

## 2020-05-25 NOTE — Telephone Encounter (Signed)
Patient said she was in in Feb for AEX and you only provided one month refill of her generic Activella until she had her mammogram.  She said she did have mammogram 05/09/20 (copy in chart) and it showed something that they are having her back for diagnostic testing and she is scheduled 05/31/20.  She said she is going to run out of her HRT and asked if you would refill once more.

## 2020-05-29 ENCOUNTER — Other Ambulatory Visit: Payer: Self-pay

## 2020-05-31 ENCOUNTER — Other Ambulatory Visit: Payer: Self-pay

## 2020-05-31 ENCOUNTER — Ambulatory Visit
Admission: RE | Admit: 2020-05-31 | Discharge: 2020-05-31 | Disposition: A | Discharge status: Home / Self Care | Payer: PRIVATE HEALTH INSURANCE | Source: Ambulatory Visit | Attending: Obstetrics and Gynecology | Admitting: Obstetrics and Gynecology

## 2020-05-31 DIAGNOSIS — R928 Other abnormal and inconclusive findings on diagnostic imaging of breast: Secondary | ICD-10-CM

## 2020-06-05 NOTE — Telephone Encounter (Signed)
Imaging is now in epic. Please review & approve rx if appropriate.

## 2020-06-15 ENCOUNTER — Other Ambulatory Visit: Payer: Self-pay | Admitting: Family Medicine

## 2020-06-15 DIAGNOSIS — B009 Herpesviral infection, unspecified: Secondary | ICD-10-CM

## 2020-07-05 ENCOUNTER — Other Ambulatory Visit: Payer: Self-pay

## 2020-07-05 MED ORDER — ESTRADIOL-NORETHINDRONE ACET 0.5-0.1 MG PO TABS: 1.0000 | ORAL_TABLET | Freq: Every day | ORAL | 2 refills | Status: DC

## 2020-07-05 NOTE — Telephone Encounter (Signed)
Received request to transfer Rx for Estradiol tabs.  Medication refill request: Estradiol 0.5/0.1 #90 Last AEX:  03-15-20 Next AEX: none Last MMG (if hormonal medication request/): 05-10-20 3D/poss Lt.Br.mass, Rt.Br.neg.  Lt.Br.Diag.w/US--Benign cyst/screening 11yr/BiRads2 Refill authorized: [facility]  Routed to provider

## 2020-08-21 ENCOUNTER — Encounter: Payer: Self-pay | Admitting: Family Medicine

## 2020-08-21 ENCOUNTER — Telehealth (INDEPENDENT_AMBULATORY_CARE_PROVIDER_SITE_OTHER): Payer: PRIVATE HEALTH INSURANCE | Admitting: Family Medicine

## 2020-08-21 ENCOUNTER — Other Ambulatory Visit: Payer: Self-pay

## 2020-08-21 DIAGNOSIS — U071 COVID-19: Secondary | ICD-10-CM

## 2020-08-21 HISTORY — DX: COVID-19: U07.1

## 2020-08-21 MED ORDER — NIRMATRELVIR/RITONAVIR (PAXLOVID)TABLET: 3.0000 | ORAL_TABLET | Freq: Two times a day (BID) | ORAL | 0 refills | Status: AC

## 2020-08-21 NOTE — Patient Instructions (Signed)
10 Things You Can Do to Manage Your COVID-19 Symptoms at Home If you have possible or confirmed COVID-19 Stay home except to get medical care. Monitor your symptoms carefully. If your symptoms get worse, call your healthcare provider immediately. Get rest and stay hydrated. If you have a medical appointment, call the healthcare provider ahead of time and tell them that you have or may have COVID-19. For medical emergencies, call 911 and notify the dispatch personnel that you have or may have COVID-19. Cover your cough and sneezes with a tissue or use the inside of your elbow. Wash your hands often with soap and water for at least 20 seconds or clean your hands with an alcohol-based hand sanitizer that contains at least 60% alcohol. As much as possible, stay in a specific room and away from other people in your home. Also, you should use a separate bathroom, if available. If you need to be around other people in or outside of the home, wear a mask. Avoid sharing personal items with other people in your household, like dishes, towels, and bedding. Clean all surfaces that are touched often, like counters, tabletops, and doorknobs. Use household cleaning sprays or wipes according to the label instructions. cdc.gov/coronavirus 08/27/2019 This information is not intended to replace advice given to you by your health care provider. Make sure you discuss any questions you have with your healthcare provider. Document Revised: 03/17/2020 Document Reviewed: 03/17/2020 Elsevier Patient Education  2022 Elsevier Inc.  

## 2020-08-21 NOTE — Progress Notes (Signed)
VIRTUAL VISIT VIA VIDEO  I connected with Heather Chambers on 08/21/20 at  4:00 PM EDT by elemedicine application and verified that I am speaking with the correct person using two identifiers. Location patient: Home Location provider: [facility], Office Persons participating in the virtual visit: Patient, Dr. Claiborne Billings and Reece Agar. Cesar, CMA  I discussed the limitations of evaluation and management by telemedicine and the availability of in person appointments. The patient expressed understanding and agreed to proceed.   SUBJECTIVE Chief Complaint  Patient presents with   Covid Positive    Pt c/o nasal congestion, sore throat, fatigue, HA, chest tightness, CP x 3 days; pt tested pos 7/10    HPI: Heather Chambers is a 65 y.o. female with covid + illness.  Exposure:unknown.positive test 08/21/2020 Sx start: Friday fatigue and dry cough. Then sore throat set in that night. Vaccine:x3 pfizer Pt endorses headache, chest tightness Pt denies shortness of breath, nausea or vomit . Otc: advil  GFR: >90 Anticoag: N/A  ROS: See pertinent positives and negatives per HPI.  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   Acute bursitis of right shoulder 03/19/2018   Allergic conjunctivitis 06/17/2017   Chronic migraine without aura 04/01/2017   Hormone replacement therapy (HRT) 01/22/2017   Arthritis 01/22/2017   Allergic rhinitis with probable nonallergic component 05/31/2014   Menopausal symptoms    Hypothyroidism 10/18/2010    Social History   Tobacco Use   Smoking status: Never   Smokeless tobacco: Never  Substance Use Topics   Alcohol use: Yes    Alcohol/week: 4.0 standard drinks    Types: 4 Cans of beer per week    Comment: wine and beer    Current Outpatient Medications:    azelastine (OPTIVAR) 0.05 % ophthalmic solution, Place 1 drop  into both eyes 2 (two) times daily., Disp: 6 mL, Rfl: 2   Beclomethasone Dipropionate (QNASL) 80 MCG/ACT AERS, Place 1 spray into the nose daily., Disp: 8.7 g, Rfl: 11   Carbinoxamine Maleate 4 MG TABS, Take 1 tablet (4 mg total) by mouth as needed (every 6-8 hours)., Disp: 30 tablet, Rfl: 11   Estradiol-Norethindrone Acet 0.5-0.1 MG tablet, Take 1 tablet by mouth daily., Disp: 84 tablet, Rfl: 2   Fremanezumab-vfrm (AJOVY) 225 MG/1.5ML SOAJ, Inject 225 mg into the skin every 30 (thirty) days., Disp: 1.5 mL, Rfl: 11   ipratropium (ATROVENT) 0.06 % nasal spray, Place 2 sprays into both nostrils 3 (three) times daily., Disp: 15 mL, Rfl: 10   nirmatrelvir/ritonavir EUA (PAXLOVID) TABS, Take 3 tablets by mouth 2 (two) times daily for 5 days. (Take nirmatrelvir 150 mg two tablets twice daily for 5 days and ritonavir 100 mg one tablet twice daily for 5 days) Patient GFR is >90, Disp: 30 tablet, Rfl: 0   ondansetron (ZOFRAN-ODT) 4 MG disintegrating tablet, Take 1-2 tablets (4-8 mg total) by mouth every 8 (eight) hours as needed for nausea., Disp: 30 tablet, Rfl: 3   rizatriptan (MAXALT-MLT) 10 MG disintegrating tablet, Take 1 tablet (10 mg total) by mouth as needed for migraine. May repeat in 2 hours if needed, Disp: 9 tablet, Rfl: 11   SUMAtriptan (IMITREX) 100 MG tablet, TAKE 1 TABLET ONCE. MAY REPEAT IN 2 HOURS IF HEADACHE PERSISTS OR RECURS, Disp: 18 tablet, Rfl: 4   SYNTHROID 75 MCG tablet, Take 1 tablet (  75 mcg total) by mouth daily. On an empty stomach, Disp: 90 tablet, Rfl: 3   valACYclovir (VALTREX) 500 MG tablet, TAKE 1 TABLET TWICE A DAY AS NEEDED, Disp: 30 tablet, Rfl: 23  Allergies  Allergen Reactions   Augmentin [Amoxicillin-Pot Clavulanate] Nausea And Vomiting   Cefdinir Hives   Etodolac     "LODINE"   Phenergan [Promethazine Hcl]     Restlessness, twitching    OBJECTIVE: LMP 02/12/2008 (Approximate)  Gen: No acute distress. Nontoxic in appearance.  HENT: AT. [place].  MMM.  Eyes:Pupils  Equal Round Reactive to light, Extraocular movements intact,  Conjunctiva without redness, discharge or icterus. Chest: Cough or shortness of breath not present.  Skin: no rashes, purpura or petechiae.  Neuro:  lert. Oriented x3  Psych: Normal affect and demeanor. Normal speech. Normal thought content and judgment.  ASSESSMENT AND PLAN: KINDELL STRADA is a 65 y.o. female present for  1COVID-19 Rest, hydrate.  mucinex (DM if cough), nettie pot or nasal saline.  paxlovid prescribed, take until completed.  If cough present it can last up to 6-8 weeks.  F/U 2 weeks if not improved-sooner of worsening.   Reviewed home care instructions for COVID. Advised self-isolation at home for at least 5 days. After 5 days, if improved and fever resolved, can be in public, but should wear a mask around others for an additional 5 days. If symptoms, esp, dyspnea develops/worsens, recommend in-person evaluation at either an urgent care or the emergency room.  Felix Pacini, DO 08/21/2020   No follow-ups on file.  No orders of the defined types were placed in this encounter.  Meds ordered this encounter  Medications   nirmatrelvir/ritonavir EUA (PAXLOVID) TABS    Sig: Take 3 tablets by mouth 2 (two) times daily for 5 days. (Take nirmatrelvir 150 mg two tablets twice daily for 5 days and ritonavir 100 mg one tablet twice daily for 5 days) Patient GFR is >90    Dispense:  30 tablet    Refill:  0   Referral Orders  No referral(s) requested today

## 2020-11-02 ENCOUNTER — Telehealth: Payer: Self-pay | Admitting: Neurology

## 2020-11-02 ENCOUNTER — Telehealth (INDEPENDENT_AMBULATORY_CARE_PROVIDER_SITE_OTHER): Payer: PRIVATE HEALTH INSURANCE | Admitting: Neurology

## 2020-11-02 DIAGNOSIS — G43701 Chronic migraine without aura, not intractable, with status migrainosus: Secondary | ICD-10-CM | POA: Diagnosis not present

## 2020-11-02 MED ORDER — AJOVY 225 MG/1.5ML ~~LOC~~ SOAJ: 225.0000 mg | SUBCUTANEOUS | 11 refills | Status: DC

## 2020-11-02 MED ORDER — RIZATRIPTAN BENZOATE 10 MG PO TBDP: 10.0000 mg | ORAL_TABLET | ORAL | 11 refills | Status: DC | PRN

## 2020-11-02 MED ORDER — ONDANSETRON 4 MG PO TBDP: 4.0000 mg | ORAL_TABLET | Freq: Three times a day (TID) | ORAL | 11 refills | Status: DC | PRN

## 2020-11-02 NOTE — Patient Instructions (Signed)
Continue current medications. 

## 2020-11-02 NOTE — Progress Notes (Signed)
[facility]    Provider:  Dr Lucia Gaskins Referring Provider: Natalia Leatherwood, DO Primary Care Physician:  Natalia Leatherwood, DO  CC:  Migraines  Virtual Visit via Video Note  I connected with Heather Chambers on 11/02/20 at  3:00 PM EDT by a video enabled telemedicine application and verified that I am speaking with the correct person using two identifiers.  Location: Patient: home Provider: office   I discussed the limitations of evaluation and management by telemedicine and the availability of in person appointments. The patient expressed understanding and agreed to proceed.    I discussed the assessment and treatment plan with the patient. The patient was provided an opportunity to ask questions and all were answered. The patient agreed with the plan and demonstrated an understanding of the instructions.   The patient was advised to call back or seek an in-person evaluation if the symptoms worsen or if the condition fails to improve as anticipated.  I provided 20 minutes of non-face-to-face time during this encounter.   Anson Fret, MD   Interval history 11/02/2020: Rizatriptan looks great. She is doing great on Ajovy. She takes citrocel and that helps with the constipation.  She was on Aimovig initially and could not tolerate due to the constipation but Ajovy is at least better.  She likes the Maxalt, so much better than the Imitrex as far side effects ago, she takes the Ajovy every 30 days, if she has a headache near the end when it is wearing off the Maxalt helps tremendously, I told her she could take the Zofran alone or with the Maxalt or if she is having really bad migraine she can take the Maxalt with the ondansetron as well as an Aleve or other over-the-counter analgesic.  We discussed that we could always try Emgality, it is a faster injection (the Ajovy does burn going in) and it may not cause as much constipation is hard to say people are different, I also  talked about Nurtec and Bernita Raisin is acute management, but at this time patient feels like she is doing really well we will schedule her for a follow-up in 1 year.  Interval history: Patient did great on Ajovy but could not get it approved. Will try again. Failed multiple medications, she [facility] and failed at least 3 classes: amitriptyline(side effects),verapamil(hypotension), propranolol contraindicated due to hypotension, topiramate(side effects), Aimovig gave her constipation. Ajovy was better. Tried sumatriptan, maxalt.   HPI:  Heather Chambers is a 65 y.o. female here as a referral from Dr. Claiborne Billings for recurrent migraines. She went to the [facility] for [facility] and tried and failed multiple medications. Migraines started in college. It was so painful she thought she had a brain tumor. Mother has migraines with aura and vision loss but less so headaches. Patient has severe headaches. She went to the [facility] over 10 years ago. For 3 years she went every 3 months, they tried multiple medications and failed multiple medications, the medication caused lots of GI problems.  Imitrex is the only thing that works. Triggered by tension and stress in the evenings. Migraines start in the evenings and she wakes up overnight. It starts at night then it is severe in the morning. Always towards the end of the week. Can last up to 4 days before resolving. Pain can be severe. Imitrex makes her "fuzzy". They are on the right side, also sinus issues may contribute. The  migraines are pounding, throbbing with light and sound sensitivity. She has 8 migraine days a month and 15 headache days a month total. No medication.  She has had an MRI in the past and it was negative.    Reviewed notes, labs and imaging from outside physicians, which showed:  TSH 0.31 (follows with pcp), bmp, cbc, hgba1c normal  Reviewed report CT sinuses 07/2004: Clinical Data: Pain  under eyes and above left eye on and off for months.   LIMITED CT OF PARANASAL SINUSES - 07/16/04:   Technique: Limited coronal CT images were obtained through the paranasal sinuses without intravenous contrast.  Findings: Imaged paranasal sinuses are clear. No bony sclerosis or thickening to suggest chronic sinusitis. Imaged intracranial contents appear normal. Imaged appearance of the orbits is normal.  IMPRESSION:   Negative study.    Provider: Fredderick Severance  ENT CT 09/2015: reviewed report   FINDINGS: SINUSES: Mild LEFT maxillary sinus mucosal thickening along the floor. Ethmoid, sphenoid and frontal sinuses are well aerated. LEFT onodi air cell. Bilateral frontal sinus air cells. LEFT concha lamella. No abnormal antral expansion or atresia, bony wall thickening/mucoperiosteal reaction. Nasal septum is midline.  OSTIA: RIGHT ostiomeatal unit, frontal and sphenoid ethmoidal recesses are patent. Soft tissue opacifies LEFT ostiomeatal unit.  FACIAL BONES: No acute facial fracture. No destructive bony lesions.  ORBITS: Ocular globes and orbital contents are unremarkable.  IMPRESSION: Mild LEFT maxillary sinus mucosal thickening and soft tissue effacement LEFT ostiomeatal unit.   Review of Systems: Patient complains of symptoms per HPI as well as the following symptoms: headache. Pertinent negatives and positives per HPI. All others negative    Social History   Socioeconomic History   Marital status: Married    Spouse name: Not on file   Number of children: 1   Years of education: Not on file   Highest education level: Master's degree (e.g., MA, MS, MEng, MEd, MSW, MBA)  Occupational History    Employer: [employer]  Tobacco Use   Smoking status: Never   Smokeless tobacco: Never  Vaping Use   Vaping Use: Never used  Substance and Sexual Activity   Alcohol use: Yes    Alcohol/week: 4.0 standard drinks    Types: 4 Cans of beer per week     Comment: wine and beer   Drug use: No   Sexual activity: Yes    Birth control/protection: Post-menopausal  Other Topics Concern   Not on file  Social History Narrative   Ms. Mazzoni lives with her husband. She has a grown son who recently moved to [place] for a job. 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    Wears seatbelt.    Exercises 3x a week.    Smoke alarm in the home.    Feels safe in relationships   Right handed   Drinks decaf tea and coffee   Social Determinants of Health   Financial Resource Strain: Not on file  Food Insecurity: Not on file  Transportation Needs: Not on file  Physical Activity: Not on file  Stress: Not on file  Social Connections: Not on file  Intimate Partner Violence: Not on file    Family History  Problem Relation Age of Onset   Allergic rhinitis Mother    Migraines Mother    Osteoarthritis Mother    Breast cancer Maternal Grandmother    Allergic rhinitis Maternal Grandmother    Heart disease Maternal Grandfather  Breast cancer Sister    Angioedema Neg Hx    Asthma Neg Hx    Atopy Neg Hx    Eczema Neg Hx    Immunodeficiency Neg Hx    Urticaria Neg Hx     Past Medical History:  Diagnosis Date   Allergy    Arthritis    Chronic pelvic pain in female 1995   LLQ   Chronic rhinitis    Health maintenance examination    HSV-1 (herpes simplex virus 1) infection    Hypothyroidism    Menopausal symptoms    Migraine    Osteopenia 03/2017   T score -2.1 FRAX 7.5% / 0.7%    Past Surgical History:  Procedure Laterality Date   FOOT SURGERY Right 05/31/2016   joint replacement   PELVIC LAPAROSCOPY     DIAG LAP    Current Outpatient Medications  Medication Sig Dispense Refill   azelastine (OPTIVAR) 0.05 % ophthalmic solution Place 1 drop into both eyes 2 (two) times daily. 6 mL 2   Beclomethasone Dipropionate (QNASL) 80 MCG/ACT AERS Place 1 spray into the nose daily. 8.7 g 11    Estradiol-Norethindrone Acet 0.5-0.1 MG tablet Take 1 tablet by mouth daily. 84 tablet 2   Fremanezumab-vfrm (AJOVY) 225 MG/1.5ML SOAJ Inject 225 mg into the skin every 30 (thirty) days. 1.5 mL 11   ipratropium (ATROVENT) 0.06 % nasal spray Place 2 sprays into both nostrils 3 (three) times daily. 15 mL 10   ondansetron (ZOFRAN-ODT) 4 MG disintegrating tablet Take 1-2 tablets (4-8 mg total) by mouth every 8 (eight) hours as needed for nausea. 30 tablet 11   rizatriptan (MAXALT-MLT) 10 MG disintegrating tablet Take 1 tablet (10 mg total) by mouth as needed for migraine. May repeat in 2 hours if needed 9 tablet 11   SYNTHROID 75 MCG tablet Take 1 tablet (75 mcg total) by mouth daily. On an empty stomach 90 tablet 3   valACYclovir (VALTREX) 500 MG tablet TAKE 1 TABLET TWICE A DAY AS NEEDED 30 tablet 23   No current facility-administered medications for this visit.    Allergies as of 11/02/2020 - Review Complete 08/21/2020  Allergen Reaction Noted   Augmentin [amoxicillin-pot clavulanate] Nausea And Vomiting 05/30/2015   Cefdinir Hives 11/20/2007   Etodolac  11/20/2007   Phenergan [promethazine hcl]  09/13/2012    Vitals: LMP 02/12/2008 (Approximate)  Last Weight:  Wt Readings from Last 1 Encounters:  04/27/20 128 lb (58.1 kg)   Last Height:   Ht Readings from Last 1 Encounters:  04/27/20 5' 3.5" (1.613 m)   Physical exam: Exam: Gen: NAD, conversant      CV: attempted, Could not perform over Web Video. Denies palpitations or chest pain or SOB. VS: Breathing at a normal rate. Weight appears within normal limits. Not febrile. Eyes: Conjunctivae clear without exudates or hemorrhage  Neuro: Detailed Neurologic Exam  Speech:    Speech is normal; fluent and spontaneous with normal comprehension.  Cognition:    The patient is oriented to person, place, and time;     recent and remote memory intact;     language fluent;     normal attention, concentration,     fund of  knowledge Cranial Nerves:    The pupils are equal, round, and reactive to light. Attempted, Cannot perform fundoscopic exam. Visual fields are full to finger confrontation. Extraocular movements are intact.  The face is symmetric with normal sensation. The palate elevates in the midline. Hearing intact. Voice is normal.  Shoulder shrug is normal. The tongue has normal motion without fasciculations.   Coordination:    Normal finger to nose  Gait:    Normal native gait  Motor Observation:   no involuntary movements noted. Tone:    Appears normal  Posture:    Posture is normal. normal erect    Strength:    Strength is anti-gravity and symmetric in the upper and lower limbs.  Assessment/Plan:  65 year old, chronic migraines, did great on Ajovy, will try it again.   -Aimovig caused severe constipation, continue Ajovy for prevention doing great - Acute management: Rizatriptan.  Loves rizatriptan, at the end of the month when she starts feeling the Ajovy wear off it works well, told her she could take it with ondansetron and Aleve or an over-the-counter analgesic if needed, also discussed Ubrelvy and Nurtec, right now she is happy the way things are    Meds ordered this encounter  Medications   Fremanezumab-vfrm (AJOVY) 225 MG/1.5ML SOAJ    Sig: Inject 225 mg into the skin every 30 (thirty) days.    Dispense:  1.5 mL    Refill:  11    Patient has copay card; she can have medication  regardless of insurance approval or copay amount.If insurance will pay, please dispense a 90-day supply with 4 refills.   rizatriptan (MAXALT-MLT) 10 MG disintegrating tablet    Sig: Take 1 tablet (10 mg total) by mouth as needed for migraine. May repeat in 2 hours if needed    Dispense:  9 tablet    Refill:  11    If insurance will pay, please dispense a 90-day supply with 4 refills.   ondansetron (ZOFRAN-ODT) 4 MG disintegrating tablet    Sig: Take 1-2 tablets (4-8 mg total) by mouth every 8 (eight) hours  as needed for nausea.    Dispense:  30 tablet    Refill:  11    Discussed: To prevent or relieve headaches, try the following: Cool Compress. Lie down and place a cool compress on your head.  Avoid headache triggers. If certain foods or odors seem to have triggered your migraines in the past, avoid them. A headache diary might help you identify triggers.  Include physical activity in your daily routine. Try a daily walk or other moderate aerobic exercise.  Manage stress. Find healthy ways to cope with the stressors, such as delegating tasks on your to-do list.  Practice relaxation techniques. Try deep breathing, yoga, massage and visualization.  Eat regularly. Eating regularly scheduled meals and maintaining a healthy diet might help prevent headaches. Also, drink plenty of fluids.  Follow a regular sleep schedule. Sleep deprivation might contribute to headaches Consider biofeedback. With this mind-body technique, you learn to control certain bodily functions -- such as muscle tension, heart rate and blood pressure -- to prevent headaches or reduce headache pain.    Proceed to emergency room if you experience new or worsening symptoms or symptoms do not resolve, if you have new neurologic symptoms or if headache is severe, or for any concerning symptom.   Provided education and documentation from American headache Society toolbox including articles on: chronic migraine medication overuse headache, chronic migraines, prevention of migraines, behavioral and other nonpharmacologic treatments for headache.    Naomie Dean, MD  [facility] [address] [place], [place] [address]  Phone [phone] Fax [phone]  I spent 45 minutes of face-to-face and non-face-to-face time with patient on the  1. Chronic migraine without aura  with status migrainosus, not intractable     diagnosis.  This included previsit chart review, lab review, study review, order  entry, electronic health record documentation, patient education on the different diagnostic and therapeutic options, counseling and coordination of care, risks and benefits of management, compliance, or risk factor reduction

## 2020-11-02 NOTE — Telephone Encounter (Signed)
Would 1 of you please call and schedule her for a 1 year follow-up video with Dr. Daisy Blossom please thank you

## 2021-01-15 ENCOUNTER — Telehealth: Payer: Self-pay | Admitting: Family Medicine

## 2021-01-15 NOTE — Telephone Encounter (Signed)
May be placed on wait list. Pease advise pt ,PCPs are usually booked months in advance for physical slots.

## 2021-01-15 NOTE — Telephone Encounter (Signed)
Please advise if pt should be placed on waitlist

## 2021-01-15 NOTE — Telephone Encounter (Signed)
Pt called and said she needs a CPE done before the end of the year, can she be worked in?

## 2021-01-16 NOTE — Telephone Encounter (Signed)
Spoke with pt and schedule pt for next avail 01/18/21. Pt is aware to call in advance for the future.

## 2021-01-18 ENCOUNTER — Encounter: Payer: Self-pay | Admitting: Family Medicine

## 2021-01-18 ENCOUNTER — Other Ambulatory Visit: Payer: Self-pay

## 2021-01-18 ENCOUNTER — Ambulatory Visit (INDEPENDENT_AMBULATORY_CARE_PROVIDER_SITE_OTHER): Payer: PRIVATE HEALTH INSURANCE | Admitting: Family Medicine

## 2021-01-18 VITALS — BP 106/71 | HR 71 | Temp 98.1°F | Ht 62.5 in | Wt 120.0 lb

## 2021-01-18 DIAGNOSIS — M8580 Other specified disorders of bone density and structure, unspecified site: Secondary | ICD-10-CM

## 2021-01-18 DIAGNOSIS — E034 Atrophy of thyroid (acquired): Secondary | ICD-10-CM

## 2021-01-18 DIAGNOSIS — B001 Herpesviral vesicular dermatitis: Secondary | ICD-10-CM | POA: Diagnosis not present

## 2021-01-18 DIAGNOSIS — Z0000 Encounter for general adult medical examination without abnormal findings: Secondary | ICD-10-CM

## 2021-01-18 DIAGNOSIS — B009 Herpesviral infection, unspecified: Secondary | ICD-10-CM

## 2021-01-18 DIAGNOSIS — Z131 Encounter for screening for diabetes mellitus: Secondary | ICD-10-CM

## 2021-01-18 DIAGNOSIS — Z79890 Hormone replacement therapy (postmenopausal): Secondary | ICD-10-CM

## 2021-01-18 DIAGNOSIS — J3089 Other allergic rhinitis: Secondary | ICD-10-CM

## 2021-01-18 LAB — LIPID PANEL
Cholesterol: 160 mg/dL (ref 0–200)
HDL: 71.4 mg/dL (ref >39.00)
LDL Cholesterol: 75 mg/dL (ref 0–99)
NonHDL: 89.02
Total CHOL/HDL Ratio: 2
Triglycerides: 71 mg/dL (ref 0.0–149.0)
VLDL: 14.2 mg/dL (ref 0.0–40.0)

## 2021-01-18 LAB — COMPREHENSIVE METABOLIC PANEL
ALT: 9 U/L (ref 0–35)
AST: 14 U/L (ref 0–37)
Albumin: 4.3 g/dL (ref 3.5–5.2)
Alkaline Phosphatase: 40 U/L (ref 39–117)
BUN: 14 mg/dL (ref 6–23)
CO2: 27 mEq/L (ref 19–32)
Calcium: 9.3 mg/dL (ref 8.4–10.5)
Chloride: 101 mEq/L (ref 96–112)
Creatinine, Ser: 0.83 mg/dL (ref 0.40–1.20)
GFR: 75.08 mL/min (ref >60.00)
Glucose, Bld: 82 mg/dL (ref 70–99)
Potassium: 4.2 mEq/L (ref 3.5–5.1)
Sodium: 134 mEq/L — ABNORMAL LOW (ref 135–145)
Total Bilirubin: 0.5 mg/dL (ref 0.2–1.2)
Total Protein: 6.8 g/dL (ref 6.0–8.3)

## 2021-01-18 LAB — T4, FREE: Free T4: 0.82 ng/dL (ref 0.60–1.60)

## 2021-01-18 LAB — CBC
HCT: 40.5 % (ref 36.0–46.0)
Hemoglobin: 13.4 g/dL (ref 12.0–15.0)
MCHC: 33.1 g/dL (ref 30.0–36.0)
MCV: 91.7 fl (ref 78.0–100.0)
Platelets: 364 10*3/uL (ref 150.0–400.0)
RBC: 4.41 Mil/uL (ref 3.87–5.11)
RDW: 14.3 % (ref 11.5–15.5)
WBC: 6.5 10*3/uL (ref 4.0–10.5)

## 2021-01-18 LAB — HEMOGLOBIN A1C: Hgb A1c MFr Bld: 5.3 % (ref 4.6–6.5)

## 2021-01-18 LAB — VITAMIN D 25 HYDROXY (VIT D DEFICIENCY, FRACTURES): VITD: 31.48 ng/mL (ref 30.00–100.00)

## 2021-01-18 LAB — TSH: TSH: 2.19 u[IU]/mL (ref 0.35–5.50)

## 2021-01-18 MED ORDER — VALACYCLOVIR HCL 500 MG PO TABS: ORAL_TABLET | ORAL | 11 refills | Status: DC

## 2021-01-18 NOTE — Patient Instructions (Signed)

## 2021-01-18 NOTE — Progress Notes (Signed)
This visit occurred during the SARS-CoV-2 public health emergency.  Safety protocols were in place, including screening questions prior to the visit, additional usage of staff PPE, and extensive cleaning of exam room while observing appropriate contact time as indicated for disinfecting solutions.    Patient ID: Heather Chambers, female  DOB: 1955-05-20, 65 y.o.   MRN: [id number] Patient Care Team    Relationship Specialty Notifications Start End  Heather Hillock, DO PCP - General Family Medicine  01/09/15   Huel Cote, NP (Inactive) Nurse Practitioner Obstetrics and Gynecology  01/18/16   Milus Banister, MD Attending Physician Gastroenterology  01/18/16   Bobbitt, [place] Muta, MD Consulting Physician Allergy and Immunology  01/22/17     Chief Complaint  Patient presents with   Annual Exam    Pt is not fasting     Subjective: Heather Chambers is a 65 y.o.  Female  present for CPE  All past medical history, surgical history, allergies, family history, immunizations, medications and social history were updated in the electronic medical record today. All recent labs, ED visits and hospitalizations within the last year were reviewed.  Health maintenance:  Colonoscopy:colonoscopy-No polys no FHx. 2011. Dr. Ardis Hughs. She elected cologuard 03/09/2020 which was negative. Screen again 02/2023. Mammogram: GYN, 03/2020,recent abnormal with further images ok.  FHX of breast cancer.  Cervical cancer screening: 03/2020 UTD, normal. 3-5 year f/u, has gyn.  Immunizations: UTD flu 2022, tdap UTD 2021, PNA series completed rpt > 65. Shingrix  series completed.  Infectious disease screening: HIV and Hep C completed DEXA: Osteopenia, DEXA 2-3 years ago. Gyn completes Assistive device: none Oxygen OPF:YTWK Patient has a Dental home. Hospitalizations/ED visits: reviewed  hypothyroidism Patient reports compliance  with brand Synthroid 75 mcg daily.  She does have some concerns that her insurance will not cover  name brand any longer and she cannot take the generic form.   Hormone replacement therapy (HRT) - long term hormone replacement gyn prescribed.    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  She is using the Imitrex as needed.  She has some concerns about insurance pain medication again.   Allergic rhinitis with probable nonallergic component Managed by allergist again.   Depression screen [facility] 2/9 01/18/2021 01/27/2020 02/10/2019 01/23/2018 01/22/2017  Decreased Interest 0 0 0 0 0  Down, Depressed, Hopeless 0 0 0 0 0  PHQ - 2 Score 0 0 0 0 0   No flowsheet data found.  Immunization History  Administered Date(s) Administered   Influenza Whole 11/20/2007, 11/30/2009   Influenza,inj,Quad PF,6+ Mos 10/31/2016, 11/07/2017, 12/11/2018, 12/02/2019   Influenza-Unspecified 10/31/2014, 10/29/2015, 10/31/2016, 12/10/2020   PFIZER(Purple Top)SARS-COV-2 Vaccination 05/15/2019, 06/10/2019, 01/02/2020   Pneumococcal Conjugate-13 07/19/2015   Pneumococcal Polysaccharide-23 02/24/2009   Td 02/24/2009   Tdap 01/27/2020   Zoster Recombinat (Shingrix) 02/10/2019, 02/09/2020   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   Injection May 04, 2018   Chronic pelvic pain in female 1995   LLQ   Chronic rhinitis    COVID-19 08/21/2020   Health maintenance examination    HSV-1 (herpes simplex virus 1) infection    Hypothyroidism    Labral tear of shoulder, degenerative, 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   Lef   Menopausal  symptoms    Migraine    Osteopenia 03/2017   T score -2.1 FRAX 7.5% / 0.7%   Allergies  Allergen Reactions   Augmentin [Amoxicillin-Pot Clavulanate] Nausea And Vomiting   Cefdinir Hives   Etodolac     "LODINE"   Phenergan  [Promethazine Hcl]     Restlessness, twitching   Past Surgical History:  Procedure Laterality Date   FOOT SURGERY Right 05/31/2016   joint replacement   PELVIC LAPAROSCOPY     DIAG LAP   Family History  Problem Relation Age of Onset   Allergic rhinitis Mother    Migraines Mother    Osteoarthritis Mother    Breast cancer Maternal Grandmother    Allergic rhinitis Maternal Grandmother    Heart disease Maternal Grandfather    Breast cancer Sister    Angioedema Neg Hx    Asthma Neg Hx    Atopy Neg Hx    Eczema Neg Hx    Immunodeficiency Neg Hx    Urticaria Neg Hx    Social History   Social History Narrative   Heather Chambers lives with her husband. She has a grown son who recently moved to [place] for a job. 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    Wears seatbelt.    Exercises 3x a week.    Smoke alarm in the home.    Feels safe in relationships   Right handed   Drinks decaf tea and coffee    Allergies as of 01/18/2021       Reactions   Augmentin [amoxicillin-pot Clavulanate] Nausea And Vomiting   Cefdinir Hives   Etodolac    "LODINE"   Phenergan [promethazine Hcl]    Restlessness, twitching        Medication List        Accurate as of January 18, 2021 10:01 AM. If you have any questions, ask your nurse or doctor.          STOP taking these medications    azelastine 0.05 % ophthalmic solution Commonly known as: OPTIVAR Stopped by: Howard Pouch, DO   Qnasl 80 MCG/ACT Aers Generic drug: Beclomethasone Dipropionate Stopped by: Howard Pouch, DO       TAKE these medications    Ajovy 225 MG/1.5ML Soaj Generic drug: Fremanezumab-vfrm Inject 225 mg into the skin every 30 (thirty) days.   Estradiol-Norethindrone Acet 0.5-0.1 MG tablet Take 1 tablet by mouth daily.   ipratropium 0.06 % nasal spray Commonly known as: ATROVENT Place 2 sprays into both nostrils 3 (three) times daily.   ondansetron 4 MG  disintegrating tablet Commonly known as: ZOFRAN-ODT Take 1-2 tablets (4-8 mg total) by mouth every 8 (eight) hours as needed for nausea.   rizatriptan 10 MG disintegrating tablet Commonly known as: MAXALT-MLT Take 1 tablet (10 mg total) by mouth as needed for migraine. May repeat in 2 hours if needed   Synthroid 75 MCG tablet Generic drug: levothyroxine Take 1 tablet (75 mcg total) by mouth daily. On an empty stomach   valACYclovir 500 MG tablet Commonly known as: VALTREX TAKE 1 TABLET TWICE A DAY AS NEEDED        All past medical history, surgical history, allergies, family history, immunizations andmedications were updated in the EMR today and reviewed under the history and medication portions of their EMR.     No results found for this or any previous visit (from the past 2160 hour(s)).  US BREAST LTD UNI  LEFT INC AXILLA  Result Date: 05/31/2020 CLINICAL DATA:  Possible left breast mass posteriorly and inferiorly on a RECOMMENDATION: Bilateral screening mammogram in 1 year when due. I have discussed the findings and recommendations with the patient. If applicable, a reminder letter will be sent to the patient regarding the next appointment. BI-RADS CATEGORY  2: Benign. Electronically Signed   By: Claudie Revering M.D.   On: 05/31/2020 15:59   MM DIAG BREAST TOMO UNI LEFT Result Date: 05/31/2020 RECOMMENDATION: Bilateral screening mammogram in 1 year when due. I have discussed the findings and recommendations with the patient. If applicable, a reminder letter will be sent to the patient regarding the next appointment. BI-RADS CATEGORY  2: Benign. Electronically Signed   By: Claudie Revering M.D.   On: 05/31/2020 15:59     ROS 14 pt review of systems performed and negative (unless mentioned in an HPI)  Objective:  BP 106/71   Pulse 71   Temp 98.1 F (36.7 C) (Oral)   Ht 5' 2.5" (1.588 m)   Wt 120 lb (54.4 kg)   LMP 02/12/2008 (Approximate)   SpO2 100%   BMI 21.60 kg/m    Physical Exam Constitutional:      General: She is not in acute distress.    Appearance: Normal appearance. She is not ill-appearing or toxic-appearing.  HENT:     Head: Normocephalic and atraumatic.     Right Ear: Tympanic membrane, ear canal and external ear normal. There is no impacted cerumen.     Left Ear: Tympanic membrane, ear canal and external ear normal. There is no impacted cerumen.     Nose: No congestion or rhinorrhea.     Mouth/Throat:     Mouth: Mucous membranes are moist.     Pharynx: Oropharynx is clear. No oropharyngeal exudate or posterior oropharyngeal erythema.  Eyes:     General: No scleral icterus.       Right eye: No discharge.        Left eye: No discharge.     Extraocular Movements: Extraocular movements intact.     Conjunctiva/sclera: Conjunctivae normal.     Pupils: Pupils are equal, round, and reactive to light.  Cardiovascular:     Rate and Rhythm: Normal rate and regular rhythm.     Pulses: Normal pulses.     Heart sounds: Normal heart sounds. No murmur heard.   No friction rub. No gallop.  Pulmonary:     Effort: Pulmonary effort is normal. No respiratory distress.     Breath sounds: Normal breath sounds. No stridor. No wheezing, rhonchi or rales.  Chest:     Chest wall: No tenderness.  Abdominal:     General: Abdomen is flat. Bowel sounds are normal. There is no distension.     Palpations: Abdomen is soft. There is no mass.     Tenderness: There is no abdominal tenderness. There is no right CVA tenderness, left CVA tenderness, guarding or rebound.     Hernia: No hernia is present.  Musculoskeletal:        General: No swelling, tenderness or deformity. Normal range of motion.     Cervical back: Normal range of motion and neck supple. No rigidity or tenderness.     Right lower leg: No edema.     Left lower leg: No edema.  Lymphadenopathy:     Cervical: No cervical adenopathy.  Skin:    General: Skin is warm and dry.     Coloration: Skin is  not  jaundiced or pale.     Findings: No bruising, erythema, lesion or rash.  Neurological:     General: No focal deficit present.     Mental Status: She is alert and oriented to person, place, and time. Mental status is at baseline.     Cranial Nerves: No cranial nerve deficit.     Sensory: No sensory deficit.     Motor: No weakness.     Coordination: Coordination normal.     Gait: Gait normal.     Deep Tendon Reflexes: Reflexes normal.  Psychiatric:        Mood and Affect: Mood normal.        Behavior: Behavior normal.        Thought Content: Thought content normal.        Judgment: Judgment normal.     No results found.  Assessment/plan: FEVEN ALDERFER is a 65 y.o. female present for CPE/cmc hypothyroidism - she requires name brand only Synthroid> [place] will be completed after results received. If she eventually desires to use the goodrx card for name brand she will call in and we can provide her with a printed script. Tsh, t4 free   Hormone replacement therapy (HRT) - long term hormone replacement gyn prescribed.  Cbc, cmp and lipids.   HSV infection/cold sore stable Continue valACYclovir (VALTREX) 500 MG tablet   Chronic migraine without aura with status migrainosus, not intractable She had been referred to neurology and started on Anjovy and maxalt  Allergic rhinitis with probable nonallergic component - managed by allergist.   Diabetes mellitus screening - Hemoglobin A1c Osteopenia, unspecified location DEXA UTD- completed by gyn - Vitamin D (25 hydroxy) Routine general medical examination at a health care facility Colonoscopy:colonoscopy-No polys no FHx. 2011. Dr. Ardis Hughs. She elected cologuard 03/09/2020 which was negative. Screen again 02/2023. Mammogram: GYN, 03/2020,recent abnormal with further images ok.  FHX of breast cancer.  Cervical cancer screening: 03/2020 UTD, normal. 3-5 year f/u, has gyn.  Immunizations: UTD flu 2022, tdap UTD 2021, PNA  series completed rpt > 65. Shingrix  series completed.  Infectious disease screening: HIV and Hep C completed DEXA: Osteopenia, DEXA 2-3 years ago. Gyn completes Patient was encouraged to exercise greater than 150 minutes a week. Patient was encouraged to choose a diet filled with fresh fruits and vegetables, and lean meats. AVS provided to patient today for education/recommendation on gender specific health and safety maintenance. Return in about 1 year (around 01/19/2022) for CPE (30 min), CMC (30 min).  Orders Placed This Encounter  Procedures   CBC   Hemoglobin A1c   Lipid panel   TSH   T4, free   Vitamin D (25 hydroxy)   Comp Met (CMET)    Meds ordered this encounter  Medications   valACYclovir (VALTREX) 500 MG tablet    Sig: TAKE 1 TABLET TWICE A DAY AS NEEDED    Dispense:  30 tablet    Refill:  11    Referral Orders  No referral(s) requested today     Electronically signed by: Howard Pouch, [place]

## 2021-01-19 ENCOUNTER — Other Ambulatory Visit: Payer: Self-pay | Admitting: Family Medicine

## 2021-01-19 MED ORDER — SYNTHROID 75 MCG PO TABS: 75.0000 ug | ORAL_TABLET | Freq: Every day | ORAL | 3 refills | Status: DC

## 2021-01-19 NOTE — Progress Notes (Signed)
[  9:29 AM] [place], Myrene Buddy She said she's good she saw it all online no need to call back

## 2021-02-20 ENCOUNTER — Other Ambulatory Visit: Payer: Self-pay

## 2021-02-20 DIAGNOSIS — J3089 Other allergic rhinitis: Secondary | ICD-10-CM

## 2021-02-20 MED ORDER — IPRATROPIUM BROMIDE 0.06 % NA SOLN: 2.0000 | Freq: Three times a day (TID) | NASAL | 6 refills | Status: DC

## 2021-04-27 ENCOUNTER — Other Ambulatory Visit: Payer: Self-pay | Admitting: Family Medicine

## 2021-04-27 DIAGNOSIS — J3089 Other allergic rhinitis: Secondary | ICD-10-CM

## 2021-05-04 ENCOUNTER — Other Ambulatory Visit: Payer: Self-pay | Admitting: Obstetrics and Gynecology

## 2021-05-04 DIAGNOSIS — Z1231 Encounter for screening mammogram for malignant neoplasm of breast: Secondary | ICD-10-CM

## 2021-05-11 ENCOUNTER — Ambulatory Visit
Admission: RE | Admit: 2021-05-11 | Discharge: 2021-05-11 | Disposition: A | Discharge status: Home / Self Care | Payer: PRIVATE HEALTH INSURANCE | Source: Ambulatory Visit | Attending: Obstetrics and Gynecology | Admitting: Obstetrics and Gynecology

## 2021-05-11 DIAGNOSIS — Z1231 Encounter for screening mammogram for malignant neoplasm of breast: Secondary | ICD-10-CM

## 2021-05-15 ENCOUNTER — Other Ambulatory Visit: Payer: Self-pay | Admitting: Obstetrics and Gynecology

## 2021-05-15 DIAGNOSIS — R928 Other abnormal and inconclusive findings on diagnostic imaging of breast: Secondary | ICD-10-CM

## 2021-05-28 ENCOUNTER — Other Ambulatory Visit: Payer: Self-pay | Admitting: Neurology

## 2021-05-28 DIAGNOSIS — G43701 Chronic migraine without aura, not intractable, with status migrainosus: Secondary | ICD-10-CM

## 2021-06-08 ENCOUNTER — Ambulatory Visit
Admission: RE | Admit: 2021-06-08 | Discharge: 2021-06-08 | Disposition: A | Discharge status: Home / Self Care | Payer: PRIVATE HEALTH INSURANCE | Source: Ambulatory Visit | Attending: Obstetrics and Gynecology | Admitting: Obstetrics and Gynecology

## 2021-06-08 DIAGNOSIS — R928 Other abnormal and inconclusive findings on diagnostic imaging of breast: Secondary | ICD-10-CM

## 2021-06-19 ENCOUNTER — Other Ambulatory Visit: Payer: Self-pay | Admitting: *Deleted

## 2021-06-19 DIAGNOSIS — G43701 Chronic migraine without aura, not intractable, with status migrainosus: Secondary | ICD-10-CM

## 2021-06-19 MED ORDER — RIZATRIPTAN BENZOATE 10 MG PO TBDP: 10.0000 mg | ORAL_TABLET | ORAL | 0 refills | Status: DC | PRN

## 2021-07-31 ENCOUNTER — Other Ambulatory Visit: Payer: Self-pay | Admitting: Obstetrics and Gynecology

## 2021-07-31 NOTE — Telephone Encounter (Signed)
Last annual exam was 03/2020 No future annual scheduled

## 2021-08-02 ENCOUNTER — Other Ambulatory Visit: Payer: Self-pay

## 2021-08-02 DIAGNOSIS — Z79890 Hormone replacement therapy (postmenopausal): Secondary | ICD-10-CM

## 2021-08-02 MED ORDER — ESTRADIOL-NORETHINDRONE ACET 0.5-0.1 MG PO TABS: 1.0000 | ORAL_TABLET | Freq: Every day | ORAL | 0 refills | Status: DC

## 2021-08-02 NOTE — Telephone Encounter (Signed)
Pt called and left msg requesting refill on HRT.   Last AEX 03/15/20--scheduled for 09/20/2021 Last mammo 05/11/21--birads 0 Diag/US Rt breast 06/08/21-birads 2 benign

## 2021-08-07 ENCOUNTER — Encounter: Payer: Self-pay | Admitting: *Deleted

## 2021-08-09 ENCOUNTER — Telehealth: Payer: Self-pay | Admitting: *Deleted

## 2021-08-09 ENCOUNTER — Other Ambulatory Visit: Payer: Self-pay

## 2021-08-09 NOTE — Telephone Encounter (Signed)
Ajovy PA done on Rx benefits portal. See other phone note for formal documentation of PA.

## 2021-08-09 NOTE — Telephone Encounter (Signed)
Ajovy PA completed on Rx Benefits online portal. Also faxed chart notes to Rx Benefits. Received a receipt of confirmation.  Prior Auth (EOC) ID: [id number]  Drug/Service Name: AJOVY 225 MG/1.5 ML AUTOINJECT Patient: Heather Chambers  Date Requested: 08/09/2021 11:20:07 AM   MemberID: [id number]  DOB: Jul 08, 1955

## 2021-08-09 NOTE — Telephone Encounter (Signed)
Received approval from Rx Benefits. Ajovy approved 08/09/21 - 08/09/22. Approval letter faxed to [facility]. Received a receipt of confirmation.

## 2021-09-18 NOTE — Progress Notes (Unsigned)
66 y.o. G1P1 Married {Race/ethnicity:17218} female here for annual exam.    PCP:     Patient's last menstrual period was 02/12/2008 (approximate).           Sexually active: {yes no:314532}  The current method of family planning is {contraception:315051}.    Exercising: {yes no:314532}  {types:19826} Smoker:  {YES NO:22349}  Health Maintenance: Pap:  *** History of abnormal Pap:  {YES NO:22349} MMG:  *** Colonoscopy:  *** BMD:   ***  Result  *** TDaP:  *** Gardasil:   {YES NO:22349} HIV: Hep C: Screening Labs:  Hb today: ***, Urine today: ***   reports that she has never smoked. She has never used smokeless tobacco. She reports current alcohol use of about 4.0 standard drinks of alcohol per week. She reports that she does not use drugs.  Past Medical History:  Diagnosis Date  . Allergy   . Arthritis   . 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   Injection May 04, 2018  . Chronic pelvic pain in female 1995   LLQ  . Chronic rhinitis   . COVID-19 08/21/2020  . Health maintenance examination   . HSV-1 (herpes simplex virus 1) infection   . Hypothyroidism   . 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 Menopausal symptoms   . Migraine   . Osteopenia 03/2017   T score -2.1 FRAX 7.5% / 0.7%    Past Surgical History:  Procedure Laterality Date  . FOOT SURGERY Right 05/31/2016   joint replacement  . PELVIC LAPAROSCOPY     DIAG LAP    Current Outpatient Medications  Medication Sig Dispense Refill  . Estradiol-Norethindrone Acet 0.5-0.1 MG tablet Take 1 tablet by mouth daily. 56 tablet 0  . Fremanezumab-vfrm (AJOVY) 225 MG/1.5ML SOAJ Inject 225 mg into the skin every 30 (thirty) days. 1.5 mL 11  . ipratropium (ATROVENT) 0.06 % nasal spray Place 2 sprays into both nostrils 3 (three) times daily. 15 mL 6  . ondansetron (ZOFRAN-ODT) 4 MG disintegrating tablet Take 1-2 tablets (4-8 mg total) by mouth every 8 (eight) hours as needed for  nausea. 30 tablet 11  . rizatriptan (MAXALT-MLT) 10 MG disintegrating tablet Take 1 tablet (10 mg total) by mouth as needed for migraine. May repeat in 2 hours if needed. Max 2 tablets in 24 hours. 27 tablet 0  . SYNTHROID 75 MCG tablet Take 1 tablet (75 mcg total) by mouth daily. On an empty stomach 90 tablet 3  . valACYclovir (VALTREX) 500 MG tablet TAKE 1 TABLET TWICE A DAY AS NEEDED 30 tablet 11   No current facility-administered medications for this visit.    Family History  Problem Relation Age of Onset  . Allergic rhinitis Mother   . Migraines Mother   . Osteoarthritis Mother   . Breast cancer Sister 33  . Breast cancer Maternal Grandmother 17  . Allergic rhinitis Maternal Grandmother   . Heart disease Maternal Grandfather   . Angioedema Neg Hx   . Asthma Neg Hx   . Atopy Neg Hx   . Eczema Neg Hx   . Immunodeficiency Neg Hx   . Urticaria Neg Hx     Review of Systems  Exam:   LMP 02/12/2008 (Approximate)     General appearance: alert, cooperative and appears stated age Head: normocephalic, without obvious abnormality, atraumatic Neck: no adenopathy, supple, symmetrical, trachea midline and thyroid normal to inspection and palpation Lungs: clear to auscultation bilaterally Breasts: normal appearance, no  masses or tenderness, No nipple retraction or dimpling, No nipple discharge or bleeding, No axillary adenopathy Heart: regular rate and rhythm Abdomen: soft, non-tender; no masses, no organomegaly Extremities: extremities normal, atraumatic, no cyanosis or edema Skin: skin color, texture, turgor normal. No rashes or lesions Lymph nodes: cervical, supraclavicular, and axillary nodes normal. Neurologic: grossly normal  Pelvic: External genitalia:  no lesions              No abnormal inguinal nodes palpated.              Urethra:  normal appearing urethra with no masses, tenderness or lesions              Bartholins and Skenes: normal                 Vagina: normal  appearing vagina with normal color and discharge, no lesions              Cervix: no lesions              Pap taken: {yes no:314532} Bimanual Exam:  Uterus:  normal size, contour, position, consistency, mobility, non-tender              Adnexa: no mass, fullness, tenderness              Rectal exam: {yes no:314532}.  Confirms.              Anus:  normal sphincter tone, no lesions  Chaperone was present for exam:  ***  Assessment:   Well woman visit with gynecologic exam.   Plan: Mammogram screening discussed. Self breast awareness reviewed. Pap and HR HPV as above. Guidelines for Calcium, Vitamin D, regular exercise program including cardiovascular and weight bearing exercise.   Follow up annually and prn.   Additional counseling given.  {yes [id number]. _______ minutes face to face time of which over 50% was spent in counseling.    After visit summary provided.

## 2021-09-20 ENCOUNTER — Ambulatory Visit (INDEPENDENT_AMBULATORY_CARE_PROVIDER_SITE_OTHER): Payer: PRIVATE HEALTH INSURANCE | Admitting: Obstetrics and Gynecology

## 2021-09-20 ENCOUNTER — Encounter: Payer: Self-pay | Admitting: Obstetrics and Gynecology

## 2021-09-20 VITALS — BP 124/76 | HR 76 | Resp 16 | Ht 62.5 in | Wt 126.0 lb

## 2021-09-20 DIAGNOSIS — Z01419 Encounter for gynecological examination (general) (routine) without abnormal findings: Secondary | ICD-10-CM | POA: Diagnosis not present

## 2021-09-20 DIAGNOSIS — Z79890 Hormone replacement therapy (postmenopausal): Secondary | ICD-10-CM

## 2021-09-20 MED ORDER — ESTRADIOL-NORETHINDRONE ACET 0.5-0.1 MG PO TABS: 1.0000 | ORAL_TABLET | Freq: Every day | ORAL | 3 refills | Status: DC

## 2021-09-20 MED ORDER — ESTRADIOL 10 MCG VA TABS: 1.0000 | ORAL_TABLET | VAGINAL | 3 refills | Status: DC

## 2021-09-20 NOTE — Patient Instructions (Signed)
EXERCISE AND DIET:  We recommended that you start or continue a regular exercise program for good health. Regular exercise means any activity that makes your heart beat faster and makes you sweat.  We recommend exercising at least 30 minutes per day at least 3 days a week, preferably 4 or 5.  We also recommend a diet low in fat and sugar.  Inactivity, poor dietary choices and obesity can cause diabetes, heart attack, stroke, and kidney damage, among others.    ALCOHOL AND SMOKING:  Women should limit their alcohol intake to no more than 7 drinks/beers/glasses of wine (combined, not each!) per week. Moderation of alcohol intake to this level decreases your risk of breast cancer and liver damage. And of course, no recreational drugs are part of a healthy lifestyle.  And absolutely no smoking or even second hand smoke. Most people know smoking can cause heart and lung diseases, but did you know it also contributes to weakening of your bones? Aging of your skin?  Yellowing of your teeth and nails?  CALCIUM AND VITAMIN D:  Adequate intake of calcium and Vitamin D are recommended.  The recommendations for exact amounts of these supplements seem to change often, but generally speaking 600 mg of calcium (either carbonate or citrate) and 800 units of Vitamin D per day seems prudent. Certain women may benefit from higher intake of Vitamin D.  If you are among these women, your doctor will have told you during your visit.    PAP SMEARS:  Pap smears, to check for cervical cancer or precancers,  have traditionally been done yearly, although recent scientific advances have shown that most women can have pap smears less often.  However, every woman still should have a physical exam from her gynecologist every year. It will include a breast check, inspection of the vulva and vagina to check for abnormal growths or skin changes, a visual exam of the cervix, and then an exam to evaluate the size and shape of the uterus and  ovaries.  And after 64 years of age, a rectal exam is indicated to check for rectal cancers. We will also provide age appropriate advice regarding health maintenance, like when you should have certain vaccines, screening for sexually transmitted diseases, bone density testing, colonoscopy, mammograms, etc.   MAMMOGRAMS:  All women over 40 years old should have a yearly mammogram. Many facilities now offer a "3D" mammogram, which may cost around $50 extra out of pocket. If possible,  we recommend you accept the option to have the 3D mammogram performed.  It both reduces the number of women who will be called back for extra views which then turn out to be normal, and it is better than the routine mammogram at detecting truly abnormal areas.    COLONOSCOPY:  Colonoscopy to screen for colon cancer is recommended for all women at age 50.  We know, you hate the idea of the prep.  We agree, BUT, having colon cancer and not knowing it is worse!!  Colon cancer so often starts as a polyp that can be seen and removed at colonscopy, which can quite literally save your life!  And if your first colonoscopy is normal and you have no family history of colon cancer, most women don't have to have it again for 10 years.  Once every ten years, you can do something that may end up saving your life, right?  We will be happy to help you get it scheduled when you are ready.    Be sure to check your insurance coverage so you understand how much it will cost.  It may be covered as a preventative service at no cost, but you should check your particular policy.    Calcium Content in Foods Calcium is the most abundant mineral in the body. Most of the body's calcium supply is stored in bones and teeth. Calcium helps many parts of the body function normally, including: Blood and blood vessels. Nerves. Hormones. Muscles. Bones and teeth. When your calcium stores are low, you may be at risk for low bone mass, bone loss, and broken bones  (fractures). When you get enough calcium, it helps to support strong bones and teeth throughout your life. Calcium is especially important for: Children during growth spurts. Girls during adolescence. Women who are pregnant or breastfeeding. Women after their menstrual cycle stops (postmenopause). Women whose menstrual cycle has stopped due to anorexia nervosa or regular intense exercise. People who cannot eat or digest dairy products. Vegans. Recommended daily amounts of calcium: Women (ages 19 to 50): 1,000 mg per day. Women (ages 51 and older): 1,200 mg per day. Men (ages 19 to 70): 1,000 mg per day. Men (ages 71 and older): 1,200 mg per day. Women (ages 9 to 18): 1,300 mg per day. Men (ages 9 to 18): 1,300 mg per day. General information Eat foods that are high in calcium. Try to get most of your calcium from food. Some people may benefit from taking calcium supplements. Check with your health care provider or diet and nutrition specialist (dietitian) before starting any calcium supplements. Calcium supplements may interact with certain medicines. Too much calcium may cause other health problems, such as constipation and kidney stones. For the body to absorb calcium, it needs vitamin D. Sources of vitamin D include: Skin exposure to direct sunlight. Foods, such as egg yolks, liver, mushrooms, saltwater fish, and fortified milk. Vitamin D supplements. Check with your health care provider or dietitian before starting any vitamin D supplements. What foods are high in calcium?  Foods that are high in calcium contain more than 100 milligrams per serving. Fruits Fortified orange juice or other fruit juice, 300 mg per 8 oz serving. Vegetables Collard greens, 360 mg per 8 oz serving. Kale, 100 mg per 8 oz serving. Bok choy, 160 mg per 8 oz serving. Grains Fortified ready-to-eat cereals, 100 to 1,000 mg per 8 oz serving. Fortified frozen waffles, 200 mg in 2 waffles. Oatmeal, 140 mg in  1 cup. Meats and other proteins Sardines, canned with bones, 325 mg per 3 oz serving. Salmon, canned with bones, 180 mg per 3 oz serving. Canned shrimp, 125 mg per 3 oz serving. Baked beans, 160 mg per 4 oz serving. Tofu, firm, made with calcium sulfate, 253 mg per 4 oz serving. Dairy Yogurt, plain, low-fat, 310 mg per 6 oz serving. Nonfat milk, 300 mg per 8 oz serving. American cheese, 195 mg per 1 oz serving. Cheddar cheese, 205 mg per 1 oz serving. Cottage cheese 2%, 105 mg per 4 oz serving. Fortified soy, rice, or almond milk, 300 mg per 8 oz serving. Mozzarella, part skim, 210 mg per 1 oz serving. The items listed above may not be a complete list of foods high in calcium. Actual amounts of calcium may be different depending on processing. Contact a dietitian for more information. What foods are lower in calcium? Foods that are lower in calcium contain 50 mg or less per serving. Fruits Apple, about 6 mg. Banana, about 12 mg.   Vegetables Lettuce, 19 mg per 2 oz serving. Tomato, about 11 mg. Grains Rice, 4 mg per 6 oz serving. Boiled potatoes, 14 mg per 8 oz serving. White bread, 6 mg per slice. Meats and other proteins Egg, 27 mg per 2 oz serving. Red meat, 7 mg per 4 oz serving. Chicken, 17 mg per 4 oz serving. Fish, cod, or trout, 20 mg per 4 oz serving. Dairy Cream cheese, regular, 14 mg per 1 Tbsp serving. Brie cheese, 50 mg per 1 oz serving. Parmesan cheese, 70 mg per 1 Tbsp serving. The items listed above may not be a complete list of foods lower in calcium. Actual amounts of calcium may be different depending on processing. Contact a dietitian for more information. Summary Calcium is an important mineral in the body because it affects many functions. Getting enough calcium helps support strong bones and teeth throughout your life. Try to get most of your calcium from food. Calcium supplements may interact with certain medicines. Check with your health care provider  or dietitian before starting any calcium supplements. This information is not intended to replace advice given to you by your health care provider. Make sure you discuss any questions you have with your health care provider. Document Revised: 05/26/2019 Document Reviewed: 05/26/2019 Elsevier Patient Education  2023 [employer]  

## 2021-09-25 ENCOUNTER — Other Ambulatory Visit: Payer: Self-pay | Admitting: Obstetrics and Gynecology

## 2021-09-25 DIAGNOSIS — Z79890 Hormone replacement therapy (postmenopausal): Secondary | ICD-10-CM

## 2021-09-25 NOTE — Telephone Encounter (Signed)
Left message for patient to call to see if she needs refill now from local pharmacy. Rx was sent for 90 day supply with mail order.

## 2021-09-25 NOTE — Telephone Encounter (Signed)
Patient called back stating she did not request refill and she has enough supply until mail order arrive from 09/20/21.Rx denied.

## 2021-09-26 ENCOUNTER — Other Ambulatory Visit: Payer: Self-pay | Admitting: *Deleted

## 2021-09-26 DIAGNOSIS — M8580 Other specified disorders of bone density and structure, unspecified site: Secondary | ICD-10-CM

## 2021-10-01 ENCOUNTER — Other Ambulatory Visit: Payer: Self-pay | Admitting: Family Medicine

## 2021-10-01 DIAGNOSIS — J3089 Other allergic rhinitis: Secondary | ICD-10-CM

## 2021-10-16 ENCOUNTER — Other Ambulatory Visit: Payer: Self-pay

## 2021-10-16 ENCOUNTER — Emergency Department (HOSPITAL_BASED_OUTPATIENT_CLINIC_OR_DEPARTMENT_OTHER)
Admission: EM | Admit: 2021-10-16 | Discharge: 2021-10-16 | Disposition: A | Discharge status: Home / Self Care | Payer: 59 | Attending: Emergency Medicine | Admitting: Emergency Medicine

## 2021-10-16 ENCOUNTER — Other Ambulatory Visit (HOSPITAL_BASED_OUTPATIENT_CLINIC_OR_DEPARTMENT_OTHER): Payer: Self-pay

## 2021-10-16 ENCOUNTER — Encounter (HOSPITAL_BASED_OUTPATIENT_CLINIC_OR_DEPARTMENT_OTHER): Payer: Self-pay | Admitting: Emergency Medicine

## 2021-10-16 ENCOUNTER — Emergency Department (HOSPITAL_BASED_OUTPATIENT_CLINIC_OR_DEPARTMENT_OTHER): Payer: 59

## 2021-10-16 DIAGNOSIS — M25552 Pain in left hip: Secondary | ICD-10-CM

## 2021-10-16 DIAGNOSIS — M545 Low back pain, unspecified: Secondary | ICD-10-CM | POA: Insufficient documentation

## 2021-10-16 DIAGNOSIS — M5410 Radiculopathy, site unspecified: Secondary | ICD-10-CM

## 2021-10-16 MED ORDER — OXYCODONE HCL 5 MG PO TABS
5.0000 mg | ORAL_TABLET | ORAL | 0 refills | Status: DC | PRN
  Filled 2021-10-16: qty 15, 3d supply, fill #0

## 2021-10-16 MED ORDER — HYDROCODONE-ACETAMINOPHEN 5-325 MG PO TABS
1.0000 | ORAL_TABLET | Freq: Once | ORAL | Status: AC
  Administered 2021-10-16: 1 via ORAL
  Filled 2021-10-16: qty 1

## 2021-10-16 MED ORDER — FENTANYL CITRATE PF 50 MCG/ML IJ SOSY
25.0000 ug | PREFILLED_SYRINGE | Freq: Once | INTRAMUSCULAR | Status: AC
  Administered 2021-10-16: 25 ug via INTRAMUSCULAR
  Filled 2021-10-16: qty 1

## 2021-10-16 MED ORDER — LIDOCAINE 5 % EX PTCH
1.0000 | MEDICATED_PATCH | CUTANEOUS | Status: DC
  Administered 2021-10-16: 1 via TRANSDERMAL
  Filled 2021-10-16: qty 1

## 2021-10-16 MED ORDER — CYCLOBENZAPRINE HCL 10 MG PO TABS
10.0000 mg | ORAL_TABLET | Freq: Two times a day (BID) | ORAL | 0 refills | Status: DC | PRN
  Filled 2021-10-16: qty 20, 10d supply, fill #0

## 2021-10-16 MED ORDER — METHYLPREDNISOLONE 4 MG PO TBPK
ORAL_TABLET | ORAL | 0 refills | Status: DC
  Filled 2021-10-16: qty 21, 6d supply, fill #0

## 2021-10-16 NOTE — Discharge Instructions (Addendum)
For your pain, you may take up to 1000mg  of acetaminophen (tylenol) 4 times daily for up to a week. This is the maximum dose of acetminophen (tylenol) you can take from all sources. Please check other over-the-counter medications and prescriptions to ensure you are not taking other medications that contain acetaminophen.  You may also take ibuprofen 400 mg 6 times a day OR 600mg  4 times a day alternating with or at the same time as tylenol.  In addition to this, you may use an over the counter lidocaine patch and the prescribed muscle relaxant 2-3 times per day. The muscle relaxant will make you sleepy.  Take oxycodone as needed for breakthrough pain.  This medication can be addicting, sedating and cause constipation.

## 2021-10-16 NOTE — ED Triage Notes (Signed)
Lower back pain since Saturday. Was trying to walk yesterday made pain worse. Pain shoots down leg.

## 2021-10-16 NOTE — ED Provider Notes (Signed)
[facility] EMERGENCY DEPARTMENT Provider Note   CSN: [id number] Arrival date & time: 10/16/21  6295     History  Chief Complaint  Patient presents with   Back Pain    Heather Chambers is a 66 y.o. female.  The history is provided by the patient.  Back Pain Heather Chambers is a 67 y.o. female who presents to the Emergency Department complaining of back/hip pain.  She presents to the emergency department for evaluation of left-sided low back/hip pain that started on Saturday.  Pain radiates to just superior to the left knee and is predominantly in the left anterior leg and lateral leg.  Pain is worse with walking and straightening her leg as well as sitting.  She had minimal bump to her leg about 1 week ago but did not feel like she had a true injury from this.  No fevers.  She has had nausea secondary to pain.  She does have a history of constipation but feels like this is well managed.  No abdominal pain.  No loss of bowel or bladder.  She does have a history of sciatica but this pain feels significantly different.  She has a history of migraines as well as hormone replacement therapy.  No personal history of DVT/PE.  She has a family history of breast cancer in her sister and her grandmother.  She does receive routine mammograms.    Home Medications Prior to Admission medications   Medication Sig Start Date End Date Taking? Authorizing Provider  Estradiol (VAGIFEM) 10 MCG TABS vaginal tablet Place 1 tablet (10 mcg total) vaginally 2 (two) times a week. Use every night before bed for two weeks when you first begin this medicine, then after the first two weeks, begin using it twice a week. 09/20/21   Patton Salles, MD  Estradiol-Norethindrone Acet 0.5-0.1 MG tablet Take 1 tablet by mouth daily. 09/20/21   Amundson Illene Regulus, Brook E, MD  Fremanezumab-vfrm (AJOVY) 225 MG/1.5ML SOAJ Inject 225 mg into the skin every 30 (thirty) days. 11/02/20   Anson Fret, MD  ipratropium  (ATROVENT) 0.06 % nasal spray PLACE 2 SPRAYS INTO BOTH NOSTRILS 3 (THREE) TIMES DAILY 10/01/21   Kuneff, Renee A, DO  ondansetron (ZOFRAN-ODT) 4 MG disintegrating tablet Take 1-2 tablets (4-8 mg total) by mouth every 8 (eight) hours as needed for nausea. 11/02/20   Anson Fret, MD  rizatriptan (MAXALT-MLT) 10 MG disintegrating tablet Take 1 tablet (10 mg total) by mouth as needed for migraine. May repeat in 2 hours if needed. Max 2 tablets in 24 hours. 06/19/21   Anson Fret, MD  SYNTHROID 75 MCG tablet Take 1 tablet (75 mcg total) by mouth daily. On an empty stomach 01/19/21   Kuneff, Renee A, DO  valACYclovir (VALTREX) 500 MG tablet TAKE 1 TABLET TWICE A DAY AS NEEDED 01/18/21   Kuneff, Renee A, DO      Allergies    Augmentin [amoxicillin-pot clavulanate], Cefdinir, Etodolac, and Phenergan [promethazine hcl]    Review of Systems   Review of Systems  Musculoskeletal:  Positive for back pain.  All other systems reviewed and are negative.   Physical Exam Updated Vital Signs BP 138/77 (BP Location: Right Arm)   Pulse 72   Temp 97.9 F (36.6 C) (Oral)   Resp (!) 22   Ht 5\' 2"  (1.575 m)   Wt 57.6 kg   LMP 02/12/2008 (Approximate)   SpO2 100%   BMI 23.23  kg/m  Physical Exam Vitals and nursing note reviewed.  Constitutional:      Appearance: She is well-developed.  HENT:     Head: Normocephalic and atraumatic.  Cardiovascular:     Rate and Rhythm: Normal rate and regular rhythm.     Heart sounds: No murmur heard. Pulmonary:     Effort: Pulmonary effort is normal. No respiratory distress.     Breath sounds: Normal breath sounds.  Abdominal:     Palpations: Abdomen is soft.     Tenderness: There is no abdominal tenderness. There is no guarding or rebound.  Musculoskeletal:        General: No tenderness.     Comments: 2+ DP pulses bilaterally.  There is no significant soft tissue tenderness to palpation over the hip, leg.  Position of comfort is with the hip flexed and knee  bent on the left lower extremity.  She is able to range both the hip and knee.  Skin:    General: Skin is warm and dry.  Neurological:     Mental Status: She is alert and oriented to person, place, and time.     Comments: 5 out of 5 strength in bilateral lower extremities with sensation to light touch intact in bilateral lower extremities.  2+ patellar reflexes bilaterally.  Psychiatric:        Behavior: Behavior normal.     ED Results / Procedures / Treatments   Labs (all labs ordered are listed, but only abnormal results are displayed) Labs Reviewed - No data to display  EKG None  Radiology No results found.  Procedures Procedures    Medications Ordered in ED Medications  HYDROcodone-acetaminophen (NORCO/VICODIN) 5-325 MG per tablet 1 tablet (has no administration in time range)    ED Course/ Medical Decision Making/ A&P                           Medical Decision Making Amount and/or Complexity of Data Reviewed Radiology: ordered.  Risk Prescription drug management.   Patient here for evaluation of low back/hip and femur pain.  She is well perfused on evaluation.  Plan to obtain plain films to further evaluate.  Patient care transferred pending images.       Final Clinical Impression(s) / ED Diagnoses Final diagnoses:  None    Rx / DC Orders ED Discharge Orders     None         Tilden Fossa, MD 10/16/21 [phone]

## 2021-10-17 ENCOUNTER — Ambulatory Visit: Payer: PRIVATE HEALTH INSURANCE | Admitting: Family Medicine

## 2021-10-18 ENCOUNTER — Telehealth: Payer: Self-pay

## 2021-10-18 NOTE — Telephone Encounter (Signed)
Transition Care Management Follow-up Telephone Call Date of discharge and from where: 10/16/21 from [facility] [place] [facility] How have you been since you were released from the hospital? no Any questions or concerns? Yes Pt states that she pain is not getting any better and has questions about medication.  Items Reviewed: Did the pt receive and understand the discharge instructions provided? Yes  Medications obtained and verified? Yes  Other? No  Any new allergies since your discharge? No  Dietary orders reviewed? No Do you have support at home? Yes   Home Care and Equipment/Supplies: Were home health services ordered? not applicable If so, what is the name of the agency? N/A  Has the agency set up a time to come to the patient's home? not applicable Were any new equipment or medical supplies ordered?  No What is the name of the medical supply agency? N/a Were you able to get the supplies/equipment? not applicable Do you have any questions related to the use of the equipment or supplies? No  Functional Questionnaire: (I = Independent and D = Dependent) ADLs: I  Bathing/Dressing- I  Meal Prep- I  Eating- I  Maintaining continence- I  Transferring/Ambulation- I  Managing Meds- I  Follow up appointments reviewed:  PCP Hospital f/u appt confirmed? Yes  Scheduled to see pcp on 10/19/21 @ 4 pm. Specialist Hospital f/u appt confirmed? No   Are transportation arrangements needed? No  If their condition worsens, is the pt aware to call PCP or go to the Emergency Dept.? Yes Was the patient provided with contact information for the PCP's office or ED? Yes Was to pt encouraged to call back with questions or concerns? Yes

## 2021-10-19 ENCOUNTER — Telehealth (INDEPENDENT_AMBULATORY_CARE_PROVIDER_SITE_OTHER): Payer: PRIVATE HEALTH INSURANCE | Admitting: Family Medicine

## 2021-10-19 ENCOUNTER — Encounter: Payer: Self-pay | Admitting: Family Medicine

## 2021-10-19 DIAGNOSIS — M25552 Pain in left hip: Secondary | ICD-10-CM

## 2021-10-19 MED ORDER — OXYCODONE-ACETAMINOPHEN 10-325 MG PO TABS: 1.0000 | ORAL_TABLET | Freq: Three times a day (TID) | ORAL | 0 refills | Status: DC | PRN

## 2021-10-19 NOTE — Progress Notes (Signed)
VIRTUAL VISIT VIA VIDEO  I connected with Heather Chambers on 10/22/21 at  4:00 PM EDT by a video enabled telemedicine application and verified that I am speaking with the correct person using two identifiers. Location patient: Home Location provider: [facility], Office Persons participating in the virtual visit: Patient, Dr. Claiborne Billings and Les Pou, CMA  I discussed the limitations of evaluation and management by telemedicine and the availability of in person appointments. The patient expressed understanding and agreed to proceed.     Heather Chambers , [birth year], 66 y.o., female MRN: [id number] Patient Care Team    Relationship Specialty Notifications Start End  Natalia Leatherwood, DO PCP - General Family Medicine  01/09/15   Harrington Challenger, NP (Inactive) Nurse Practitioner Obstetrics and Gynecology  01/18/16   Rachael Fee, MD Attending Physician Gastroenterology  01/18/16   Bobbitt, Heywood Iles, MD Consulting Physician Allergy and Immunology  01/22/17     Chief Complaint  Patient presents with   Hip Pain    Pt c/o hip pain and leg pain x 6 days; pt was seen in ED      Subjective: Pt presents for an OV with complaints of hip pain.  Patient was seen in the emergency room 10/16/2021 for radicular pain.  At that visit she had concerns over left-sided hip pain that had started the Saturday prior to that visit.  The pain reportedly radiated to her left knee and was located in her front and lateral portion of her leg.  Pain was worse when walking and straightening her leg, as well as sitting.  She has had a history of sciatica in the past, but reported that the pain she was experiencing was different than her sciatic pain. She was prescribed Medrol Dosepak, Flexeril and oxycodone. Fentanyl injection and hydrocodone 5-3 25 during ED visit.  Patient reports her pain is very severe.  She points near hip flexor/left lateral hip area.  She reports she cannot stand.  She try to get a shower  and the pain was so excruciating she had to go back to bed.  She denies any known injury prior to onset of symptoms.  She states she did notice 2 weeks ago she had left lower groin pain, more so in her abdominal area that she thought there was something wrong with a "organ.  "She states that seems to have resolved.  She then was doing some work out in the yard and her husband states there was a branch or a stick that she was trying to move that came back in hit her in the left thigh.  It sounds like may be more lateral but in the same general location as current discomfort.  She reports she bruised over that area, but did not feel it was significant enough to cause current symptoms.  He does feel her leg is swollen, she reports no redness but notices her underwear is tighter on the left leg currently.  She denies any fevers or chills.  She states they checked her circulation quite well in the emergency room and the circulation appeared to be normal in her legs.  No lab work was completed.  She reports when she straightens her leg it causes pain down to her knee and it feels tight and "pulling.  "She has been taking the Oxy IR 5 mg when the pain gets severe and she does not feel like it is touching it.  She did not  want to take the Oxy 3 4 to 6 hours because she was fearful of the medication.  She states she has been going through the Advil quite a bit.  Her husband states she is even unable to sit at the dinner table without severe pressure and pain.     01/18/2021    9:34 AM 01/27/2020    9:34 AM 02/10/2019    8:36 AM 01/23/2018    1:06 PM 01/22/2017    1:01 PM  Depression screen PHQ 2/9  Decreased Interest 0 0 0 0 0  Down, Depressed, Hopeless 0 0 0 0 0  PHQ - 2 Score 0 0 0 0 0    Allergies  Allergen Reactions   Augmentin [Amoxicillin-Pot Clavulanate] Nausea And Vomiting   Cefdinir Hives   Etodolac Other (See Comments)    "LODINE"   Phenergan [Promethazine Hcl]     Restlessness, twitching    Social History   Social History Narrative   Ms. Hughson lives with her husband. She has a grown son who recently moved to [place] for a job. 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    Wears seatbelt.    Exercises 3x a week.    Smoke alarm in the home.    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   Lef   Menopausal symptoms    Migraine    Osteopenia 03/2017   T score -2.1 FRAX 7.5% / 0.7%   Past Surgical History:  Procedure Laterality Date   FOOT SURGERY Right 05/31/2016   joint replacement   PELVIC LAPAROSCOPY     DIAG LAP   Family History  Problem Relation Age of Onset   Allergic rhinitis Mother    Migraines Mother    Osteoarthritis Mother    Breast cancer Sister 82   Breast cancer Maternal Grandmother 23   Allergic rhinitis Maternal Grandmother    Heart disease Maternal Grandfather    Angioedema Neg Hx    Asthma Neg Hx    Atopy Neg Hx    Eczema Neg Hx    Immunodeficiency Neg Hx    Urticaria Neg Hx    Allergies as of 10/19/2021       Reactions   Augmentin [amoxicillin-pot Clavulanate] Nausea And Vomiting   Cefdinir Hives   Etodolac Other (See Comments)   "LODINE"   Phenergan [promethazine Hcl]    Restlessness, twitching        Medication List        Accurate as of October 19, 2021  6:14 PM. If you have any questions, ask your nurse or doctor.          Ajovy 225 MG/1.5ML Soaj Generic drug: Fremanezumab-vfrm Inject 225 mg into the skin every 30 (thirty) days.   cyclobenzaprine 10 MG  tablet Commonly known as: FLEXERIL Take 1 tablet (10 mg total) by mouth 2 (two) times daily as needed for muscle spasms.   Estradiol 10 MCG Tabs  vaginal tablet Commonly known as: Vagifem Place 1 tablet (10 mcg total) vaginally 2 (two) times a week. Use every night before bed for two weeks when you first begin this medicine, then after the first two weeks, begin using it twice a week.   Estradiol-Norethindrone Acet 0.5-0.1 MG tablet Take 1 tablet by mouth daily.   ipratropium 0.06 % nasal spray Commonly known as: ATROVENT PLACE 2 SPRAYS INTO BOTH NOSTRILS 3 (THREE) TIMES DAILY   methylPREDNISolone 4 MG Tbpk tablet Commonly known as: MEDROL DOSEPAK See instructions on package   ondansetron 4 MG disintegrating tablet Commonly known as: ZOFRAN-ODT Take 1-2 tablets (4-8 mg total) by mouth every 8 (eight) hours as needed for nausea.   oxyCODONE 5 MG immediate release tablet Commonly known as: Roxicodone Take 1 tablet (5 mg total) by mouth every 4 (four) hours as needed for severe pain.   oxyCODONE-acetaminophen 10-325 MG tablet Commonly known as: PERCOCET Take 1 tablet by mouth every 8 (eight) hours as needed for up to 7 days for pain. Started by: Felix Pacini, DO   rizatriptan 10 MG disintegrating tablet Commonly known as: MAXALT-MLT Take 1 tablet (10 mg total) by mouth as needed for migraine. May repeat in 2 hours if needed. Max 2 tablets in 24 hours.   Synthroid 75 MCG tablet Generic drug: levothyroxine Take 1 tablet (75 mcg total) by mouth daily. On an empty stomach   valACYclovir 500 MG tablet Commonly known as: VALTREX TAKE 1 TABLET TWICE A DAY AS NEEDED        All past medical history, surgical history, allergies, family history, immunizations andmedications were updated in the EMR today and reviewed under the history and medication portions of their EMR.     ROS Negative, with the exception of above mentioned in HPI   Objective:  LMP 02/12/2008 (Approximate)   There is no height or weight on file to calculate BMI.  Physical Exam Vitals and nursing note reviewed.  Constitutional:      General: She is not in acute distress.    Appearance: Normal appearance. She is normal weight. She is not ill-appearing or toxic-appearing.     Comments: In bed  Eyes:     Extraocular Movements: Extraocular movements intact.     Conjunctiva/sclera: Conjunctivae normal.     Pupils: Pupils are equal, round, and reactive to light.  Neurological:     Mental Status: She is alert and oriented to person, place, and time. Mental status is at baseline.  Psychiatric:        Mood and Affect: Mood normal.        Behavior: Behavior normal.        Thought Content: Thought content normal.        Judgment: Judgment normal.     Comments: Tearful      No results found. No results found. No results found for this or any previous visit (from the past 24 hour(s)).  Assessment/Plan: Heather Chambers is a 66 y.o. female present for OV for  Left hip pain Reviewed x-rays of femur, lumbar spine and pelvis from ED visit prior to appointment. No acute findings reported.  Patient pain appears rather severe, and she is not a patient that typically complains of discomfort. We had a lengthy conversation today and its difficult for me to settle on possible etiology of her pain without seeing her in person unable to do a full exam.   I do have concerns over possible submuscular hematoma or abscess causing  her pain.  Although no known injury, her husband states she did have a significant size branch swung back and hit her in the leg that caused bruising in the general location of her current discomfort 1 to 2 weeks ago.  Could have created hematoma formation or even progressed to abscess etc. Also cannot rule out her prior left lower quadrant pain and if it has a role in her current condition. For now, we will attempt to decrease her pain by increasing her oxycodone 5 to Percocet 10-3 25.  I  have encouraged her to try to take every 12 hours scheduled to get ahead of her pain and if needed she could take every 8 hours over the weekend. We have scheduled her for a Monday early afternoon appointment to discuss further and consider CT/MRI, along with labs rule out infection, anemia etc. I have also encouraged her to try heating pad 15 minutes on 15 minutes off over the weekend, a couple times a day.   Reviewed expectations re: course of current medical issues. Discussed self-management of symptoms. Outlined signs and symptoms indicating need for more acute intervention. Patient verbalized understanding and all questions were answered. Patient received an After-Visit Summary.    No orders of the defined types were placed in this encounter.  Meds ordered this encounter  Medications   oxyCODONE-acetaminophen (PERCOCET) 10-325 MG tablet    Sig: Take 1 tablet by mouth every 8 (eight) hours as needed for up to 7 days for pain.    Dispense:  21 tablet    Refill:  0   Referral Orders  No referral(s) requested today     Note is dictated utilizing voice recognition software. Although note has been proof read prior to signing, occasional typographical errors still can be missed. If any questions arise, please do not hesitate to call for verification.   electronically signed by:  Felix Pacini, DO  [facility] [place]

## 2021-10-19 NOTE — Patient Instructions (Signed)
Hematoma A hematoma is a collection of blood. A hematoma can happen: Under the skin. In an organ. In a body space. In a joint space. In other tissues. The blood can thicken (clot) to form a lump that you can see and feel. The lump is often hard and may become sore and tender. The lump can be very small or very big. Most hematomas get better in a few days to weeks. However, some hematomas may be serious and need medical care. What are the causes? This condition is caused by: An injury. Blood that leaks under the skin. Problems from surgeries. Medical conditions that cause bleeding or bruising. What increases the risk? You are more likely to develop this condition if: You are an older adult. You use medicines that thin your blood. What are the signs or symptoms? Symptoms depend on where the hematoma is in your body. If the hematoma is under the skin, there is: A firm lump on the body. Pain and tenderness in the area. Bruising. The skin above the lump may be blue, dark blue, purple-red, or yellowish. If the hematoma is deep in the tissues or body spaces, there may be: Blood in the stomach. This may cause pain in the belly (abdomen), weakness, passing out (fainting), and shortness of breath. Blood in the head. This may cause a headache, weakness, trouble speaking or understanding speech, or passing out. How is this diagnosed? This condition is diagnosed based on: Your medical history. A physical exam. Imaging tests, such as ultrasound or CT scan. Blood tests. How is this treated? Treatment depends on the cause, size, and location of the hematoma. Treatment may include: Doing nothing. Many hematomas go away on their own without treatment. Surgery or close monitoring. This may be needed for large hematomas or hematomas that affect the body's organs. Medicines. These may be given if a medical condition caused the hematoma. Follow these instructions at home: Managing pain, stiffness,  and swelling  If told, put ice on the area. Put ice in a plastic bag. Place a towel between your skin and the bag. Leave the ice on for 20 minutes, 2-3 times a day for the first two days. If told, put heat on the affected area after putting ice on the area for two days. Use the heat source that your doctor tells you to use. This could be a moist heat pack or a heating pad. To do this: Place a towel between your skin and the heat source. Leave the heat on for 20-30 minutes. Remove the heat if your skin turns bright red. This is very important if you are unable to feel pain, heat, or cold. You may have a greater risk of getting burned. Raise (elevate) the affected area above the level of your heart while you are sitting or lying down. Wrap the affected area with an elastic bandage, if told by your doctor. Do not wrap the bandage too tightly. If your hematoma is on a leg or foot and is painful, your doctor may give you crutches. Use them as told by your doctor. General instructions Take over-the-counter and prescription medicines only as told by your doctor. Keep all follow-up visits as told by your doctor. This is important. Contact a doctor if: You have a fever. The swelling or bruising gets worse. You start to get more hematomas. Get help right away if: Your pain gets worse. Your pain is not getting better with medicine. Your skin over the hematoma breaks or starts to bleed.   Your hematoma is in your chest or belly and you: Pass out. Feel weak. Become short of breath. You have a hematoma on your scalp that is caused by a fall or injury, and you: Have a headache that gets worse. Have trouble speaking or understanding speech. Become less alert or you pass out. Summary A hematoma is a collection of blood in any part of your body. Most hematomas get better on their own in a few days to weeks. Some may need medical care. Follow instructions from your doctor about how to care for your  hematoma. Contact a doctor if the swelling or bruising gets worse, or if you are short of breath. This information is not intended to replace advice given to you by your health care provider. Make sure you discuss any questions you have with your health care provider. Document Revised: 11/23/2020 Document Reviewed: 11/23/2020 Elsevier Patient Education  2023 [employer]  

## 2021-10-22 ENCOUNTER — Ambulatory Visit: Payer: PRIVATE HEALTH INSURANCE | Admitting: Family Medicine

## 2021-10-22 ENCOUNTER — Encounter: Payer: Self-pay | Admitting: Family Medicine

## 2021-10-22 VITALS — BP 120/84 | HR 93 | Temp 98.1°F | Ht 62.0 in | Wt 125.0 lb

## 2021-10-22 DIAGNOSIS — M79605 Pain in left leg: Secondary | ICD-10-CM | POA: Diagnosis not present

## 2021-10-22 DIAGNOSIS — R2681 Unsteadiness on feet: Secondary | ICD-10-CM | POA: Diagnosis not present

## 2021-10-22 LAB — CBC WITH DIFFERENTIAL/PLATELET
Basophils Absolute: 0.1 10*3/uL (ref 0.0–0.1)
Basophils Relative: 0.7 % (ref 0.0–3.0)
Eosinophils Absolute: 0.1 10*3/uL (ref 0.0–0.7)
Eosinophils Relative: 0.7 % (ref 0.0–5.0)
HCT: 41.1 % (ref 36.0–46.0)
Hemoglobin: 13.7 g/dL (ref 12.0–15.0)
Lymphocytes Relative: 12.6 % (ref 12.0–46.0)
Lymphs Abs: 1.1 10*3/uL (ref 0.7–4.0)
MCHC: 33.4 g/dL (ref 30.0–36.0)
MCV: 91.3 fl (ref 78.0–100.0)
Monocytes Absolute: 0.4 10*3/uL (ref 0.1–1.0)
Monocytes Relative: 4.7 % (ref 3.0–12.0)
Neutro Abs: 7.4 10*3/uL (ref 1.4–7.7)
Neutrophils Relative %: 81.3 % — ABNORMAL HIGH (ref 43.0–77.0)
Platelets: 380 10*3/uL (ref 150.0–400.0)
RBC: 4.5 Mil/uL (ref 3.87–5.11)
RDW: 13.7 % (ref 11.5–15.5)
WBC: 9.1 10*3/uL (ref 4.0–10.5)

## 2021-10-22 LAB — BASIC METABOLIC PANEL
BUN: 18 mg/dL (ref 6–23)
CO2: 26 mEq/L (ref 19–32)
Calcium: 8.8 mg/dL (ref 8.4–10.5)
Chloride: 96 mEq/L (ref 96–112)
Creatinine, Ser: 0.86 mg/dL (ref 0.40–1.20)
GFR: 71.57 mL/min (ref >60.00)
Glucose, Bld: 74 mg/dL (ref 70–99)
Potassium: 4.4 mEq/L (ref 3.5–5.1)
Sodium: 130 mEq/L — ABNORMAL LOW (ref 135–145)

## 2021-10-22 NOTE — Patient Instructions (Signed)
  We will call you with results and make further plan based on results.

## 2021-10-22 NOTE — Progress Notes (Signed)
Heather Chambers , 04-Feb-1956, 66 y.o., female MRN: [id number] Patient Care Team    Relationship Specialty Notifications Start End  Natalia Leatherwood, DO PCP - General Family Medicine  01/09/15   Harrington Challenger, NP (Inactive) Nurse Practitioner Obstetrics and Gynecology  01/18/16   Rachael Fee, MD Attending Physician Gastroenterology  01/18/16   Bobbitt, Heywood Iles, MD Consulting Physician Allergy and Immunology  01/22/17     Chief Complaint  Patient presents with   Hip Pain    Weightbearing painful     Subjective: Pt presents for an OV with complaints of left hip pain.  She was seen in the ED on September 5 with extreme left-sided hip pain without known injury.  Patient states the pain started on September 2, and she had no injury that she was aware of.  She has had x-rays of her pelvis, femur and lumbar spine that did not show cause of acute pain.  Patient was prescribed oxycodone in the ED and reported that it was not helping her pain.  She was unable to stand even in the shower.  We increased her pain meds over the weekend to see an in person evaluation today, since Friday's appointment was virtual.  She states since using the increased pain medicine, muscle relaxant and steroid she sees a mild improvement in her pain level.  However she is still unable to bear weight for longer periods of time.  She was able to get up out of bed on her own today and dressed without assistance.  That being said, she states she could feel the pain starting to build even with those minor activities.  Pain starts at the lateral bony prominence of the femur and she will feel tightness down the anterior thigh.  She also describes having some's paresthesia/numbness tingling on her anterior thigh over the weekend.  He reports that felt like there was tape or something stuck to her leg mid anterior thigh.  Prior note: Patient was seen in the emergency room 10/16/2021 for radicular pain.  At that visit she had  concerns over left-sided hip pain that had started the Saturday prior to that visit.  The pain reportedly radiated to her left knee and was located in her front and lateral portion of her leg.  Pain was worse when walking and straightening her leg, as well as sitting.  She has had a history of sciatica in the past, but reported that the pain she was experiencing was different than her sciatic pain. She was prescribed Medrol Dosepak, Flexeril and oxycodone. Fentanyl injection and hydrocodone 5-3 25 during ED visit.  Patient reports her pain is very severe.  She points near hip flexor/left lateral hip area.  She reports she cannot stand.  She try to get a shower and the pain was so excruciating she had to go back to bed.  She denies any known injury prior to onset of symptoms.  She states she did notice 2 weeks ago she had left lower groin pain, more so in her abdominal area that she thought there was something wrong with a "organ.  "She states that seems to have resolved.  She then was doing some work out in the yard and her husband states there was a branch or a stick that she was trying to move that came back in hit her in the left thigh.  It sounds like may be more lateral but in the same general location as current discomfort.  She reports she bruised over that area, but did not feel it was significant enough to cause current symptoms.  He does feel her leg is swollen, she reports no redness but notices her underwear is tighter on the left leg currently.  She denies any fevers or chills.  She states they checked her circulation quite well in the emergency room and the circulation appeared to be normal in her legs.  No lab work was completed.  She reports when she straightens her leg it causes pain down to her knee and it feels tight and "pulling.  "She has been taking the Oxy IR 5 mg when the pain gets severe and she does not feel like it is touching it.  She did not want to take the Oxy 3 4 to 6 hours  because she was fearful of the medication.  She states she has been going through the Advil quite a bit.  Her husband states she is even unable to sit at the dinner table without severe pressure and pain.     01/18/2021    9:34 AM 01/27/2020    9:34 AM 02/10/2019    8:36 AM 01/23/2018    1:06 PM 01/22/2017    1:01 PM  Depression screen PHQ 2/9  Decreased Interest 0 0 0 0 0  Down, Depressed, Hopeless 0 0 0 0 0  PHQ - 2 Score 0 0 0 0 0    Allergies  Allergen Reactions   Augmentin [Amoxicillin-Pot Clavulanate] Nausea And Vomiting   Cefdinir Hives   Etodolac Other (See Comments)    "LODINE"   Phenergan [Promethazine Hcl]     Restlessness, twitching   Social History   Social History Narrative   Ms. Rollyson lives with her husband. She has a grown son who recently moved to [place] for a job. 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    Wears seatbelt.    Exercises 3x a week.    Smoke alarm in the home.    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   Lef   Menopausal symptoms    Migraine    Osteopenia 03/2017   T score -2.1 FRAX 7.5% / 0.7%   Past Surgical History:  Procedure Laterality Date   FOOT SURGERY Right 05/31/2016   joint replacement   PELVIC LAPAROSCOPY     DIAG LAP   Family History  Problem Relation Age of Onset   Allergic rhinitis Mother    Migraines Mother    Osteoarthritis Mother    Breast cancer Sister 24   Breast cancer Maternal Grandmother 39   Allergic rhinitis Maternal Grandmother    Heart  disease Maternal Grandfather    Angioedema Neg Hx    Asthma Neg Hx    Atopy Neg Hx    Eczema Neg Hx    Immunodeficiency Neg Hx    Urticaria Neg Hx    Allergies as of 10/22/2021  Reactions   Augmentin [amoxicillin-pot Clavulanate] Nausea And Vomiting   Cefdinir Hives   Etodolac Other (See Comments)   "LODINE"   Phenergan [promethazine Hcl]    Restlessness, twitching        Medication List        Accurate as of October 22, 2021 12:43 PM. If you have any questions, ask your nurse or doctor.          Ajovy 225 MG/1.5ML Soaj Generic drug: Fremanezumab-vfrm Inject 225 mg into the skin every 30 (thirty) days.   cyclobenzaprine 10 MG tablet Commonly known as: FLEXERIL Take 1 tablet (10 mg total) by mouth 2 (two) times daily as needed for muscle spasms.   Estradiol 10 MCG Tabs vaginal tablet Commonly known as: Vagifem Place 1 tablet (10 mcg total) vaginally 2 (two) times a week. Use every night before bed for two weeks when you first begin this medicine, then after the first two weeks, begin using it twice a week.   Estradiol-Norethindrone Acet 0.5-0.1 MG tablet Take 1 tablet by mouth daily.   ipratropium 0.06 % nasal spray Commonly known as: ATROVENT PLACE 2 SPRAYS INTO BOTH NOSTRILS 3 (THREE) TIMES DAILY   methylPREDNISolone 4 MG Tbpk tablet Commonly known as: MEDROL DOSEPAK See instructions on package   ondansetron 4 MG disintegrating tablet Commonly known as: ZOFRAN-ODT Take 1-2 tablets (4-8 mg total) by mouth every 8 (eight) hours as needed for nausea.   oxyCODONE 5 MG immediate release tablet Commonly known as: Roxicodone Take 1 tablet (5 mg total) by mouth every 4 (four) hours as needed for severe pain.   oxyCODONE-acetaminophen 10-325 MG tablet Commonly known as: PERCOCET Take 1 tablet by mouth every 8 (eight) hours as needed for up to 7 days for pain.   rizatriptan 10 MG disintegrating tablet Commonly known as: MAXALT-MLT Take 1 tablet (10 mg  total) by mouth as needed for migraine. May repeat in 2 hours if needed. Max 2 tablets in 24 hours.   Synthroid 75 MCG tablet Generic drug: levothyroxine Take 1 tablet (75 mcg total) by mouth daily. On an empty stomach   valACYclovir 500 MG tablet Commonly known as: VALTREX TAKE 1 TABLET TWICE A DAY AS NEEDED        All past medical history, surgical history, allergies, family history, immunizations andmedications were updated in the EMR today and reviewed under the history and medication portions of their EMR.     ROS Negative, with the exception of above mentioned in HPI   Objective:  BP 120/84   Pulse 93   Temp 98.1 F (36.7 C) (Oral)   Ht 5\' 2"  (1.575 m)   Wt 125 lb (56.7 kg)   LMP 02/12/2008 (Approximate)   SpO2 100%   BMI 22.86 kg/m  Body mass index is 22.86 kg/m. Physical Exam Vitals and nursing note reviewed.  Constitutional:      General: She is not in acute distress.    Appearance: Normal appearance. She is normal weight. She is not ill-appearing or toxic-appearing.     Comments: In bed  Eyes:     General:        Right eye: No discharge.        Left eye: No discharge.     Extraocular Movements: Extraocular movements intact.     Conjunctiva/sclera: Conjunctivae normal.     Pupils: Pupils are equal, round, and reactive to light.  Musculoskeletal:     Right hip: Normal.     Left hip: Tenderness  and bony tenderness present. Decreased range of motion. Decreased strength.       Legs:     Comments: Left lateral leg:  (Red area marked above) tender to palpation.  No erythema.  Mild swelling present.  Greater and lesser trochanter-tender over these areas.  Decreased range of motion.  Neurovascular intact distally. (Purple): Area of paresthesia Chilton Si): Small yellow healing bruise  Skin:    Coloration: Skin is not pale.     Findings: Bruising present. No erythema or rash.  Neurological:     Mental Status: She is alert and oriented to person, place, and time.  Mental status is at baseline.  Psychiatric:        Mood and Affect: Mood normal.        Behavior: Behavior normal.        Thought Content: Thought content normal.        Judgment: Judgment normal.      No results found. No results found. No results found for this or any previous visit (from the past 24 hour(s)).  Assessment/Plan: TELIAH BUFFALO is a 66 y.o. female present for OV for  Left hip pain/gait instability Reviewed x-rays of femur, lumbar spine and pelvis from ED visit prior to appointment. No acute findings reported.   Patient initially appeared to be in rather severe pain Friday.  She is not a patient that typically complains of discomfort. She is tender to palpation today over the left lateral/anterior proximal femur and mild swelling is appreciated around this area on exam. I do have concerns over possible submuscular hematoma, abscess or stress fracture causing her pain.   Although no known injury, her husband states she did have a significant size branch swing back and hit her in the leg over a week ago. Also cannot rule out her prior left lower quadrant pain has a role in her current condition. Pain is well controlled on muscle relaxant, steroid, opioid and heating pad.  With rest.  With activity her pain resurfaces. CBC with differential to rule out infection or anemia. BMP completed to ensure able to have contrast with MRI. MRI femur ASAP.  Further recommendations on treatment plan will be discussed with results. She understands until that time to continue rest and medications discussed.  Reviewed expectations re: course of current medical issues. Discussed self-management of symptoms. Outlined signs and symptoms indicating need for more acute intervention. Patient verbalized understanding and all questions were answered. Patient received an After-Visit Summary.    Orders Placed This Encounter  Procedures   MR FEMUR LEFT W WO CONTRAST   CBC w/Diff   Basic  Metabolic Panel (BMET)   No orders of the defined types were placed in this encounter.  Referral Orders  No referral(s) requested today     Note is dictated utilizing voice recognition software. Although note has been proof read prior to signing, occasional typographical errors still can be missed. If any questions arise, please do not hesitate to call for verification.   electronically signed by:  Felix Pacini, DO  [facility] [place]

## 2021-10-23 ENCOUNTER — Telehealth: Payer: Self-pay | Admitting: Family Medicine

## 2021-10-23 DIAGNOSIS — M79605 Pain in left leg: Secondary | ICD-10-CM

## 2021-10-23 DIAGNOSIS — R2681 Unsteadiness on feet: Secondary | ICD-10-CM

## 2021-10-23 NOTE — Telephone Encounter (Signed)
Please inform patient her kidney function is normal.  Her sodium is mildly low, I would encourage her to increase electrolyte replacement with electrolyte drinks. Her white blood cell counts are borderline, still in normal range but this makes it more inconclusive on cause of her pain.   I ordered an MRI ASAP yesterday.  Please check on this and get it scheduled today if possible.  This really needs to be done today.

## 2021-10-23 NOTE — Telephone Encounter (Signed)
Spoke with pt regarding labs and instructions. Pt states she is feeling much better. Please advise on status of MRI

## 2021-10-23 NOTE — Telephone Encounter (Signed)
Priority changed to STAT

## 2021-10-23 NOTE — Addendum Note (Signed)
Addended by: Maxie Barb on: 10/23/2021 03:58 PM   Modules accepted: Orders

## 2021-10-24 ENCOUNTER — Telehealth: Payer: Self-pay | Admitting: Family Medicine

## 2021-10-24 ENCOUNTER — Ambulatory Visit (HOSPITAL_COMMUNITY)
Admission: RE | Admit: 2021-10-24 | Discharge: 2021-10-24 | Disposition: A | Discharge status: Home / Self Care | Payer: PRIVATE HEALTH INSURANCE | Source: Ambulatory Visit | Attending: Family Medicine | Admitting: Family Medicine

## 2021-10-24 DIAGNOSIS — M79605 Pain in left leg: Secondary | ICD-10-CM | POA: Insufficient documentation

## 2021-10-24 DIAGNOSIS — R2681 Unsteadiness on feet: Secondary | ICD-10-CM | POA: Diagnosis present

## 2021-10-24 MED ORDER — GADOBUTROL 1 MMOL/ML IV SOLN
5.0000 mL | Freq: Once | INTRAVENOUS | Status: AC | PRN
  Administered 2021-10-24: 5 mL via INTRAVENOUS

## 2021-10-24 NOTE — Telephone Encounter (Signed)
Spoke with pt regarding labs and instructions.   

## 2021-10-24 NOTE — Telephone Encounter (Signed)
Good news.  Her MRI shows a normal femur bone without any fractures and no signs of fluid collection such as an abscess or large hematoma.  Continue pain medications as needed only and use of heating pad as needed.  Slowly ease back into normal activity after that we can using pain as her guide.  If pain reoccurs, would need a to refer her to sports med at that time.

## 2021-10-24 NOTE — Telephone Encounter (Signed)
Provider aware

## 2021-11-02 ENCOUNTER — Encounter: Payer: Self-pay | Admitting: Family Medicine

## 2021-11-02 ENCOUNTER — Ambulatory Visit: Payer: PRIVATE HEALTH INSURANCE | Admitting: Family Medicine

## 2021-11-02 VITALS — BP 128/80 | HR 67 | Temp 97.6°F | Wt 125.0 lb

## 2021-11-02 DIAGNOSIS — M79652 Pain in left thigh: Secondary | ICD-10-CM

## 2021-11-02 DIAGNOSIS — M25552 Pain in left hip: Secondary | ICD-10-CM | POA: Diagnosis not present

## 2021-11-02 DIAGNOSIS — M7062 Trochanteric bursitis, left hip: Secondary | ICD-10-CM

## 2021-11-02 MED ORDER — OXYCODONE-ACETAMINOPHEN 10-325 MG PO TABS: ORAL_TABLET | ORAL | 0 refills | Status: DC

## 2021-11-02 MED ORDER — GABAPENTIN 300 MG PO CAPS: 300.0000 mg | ORAL_CAPSULE | Freq: Three times a day (TID) | ORAL | 0 refills | Status: DC

## 2021-11-02 MED ORDER — TRIAMCINOLONE ACETONIDE 40 MG/ML IJ SUSP
40.0000 mg | Freq: Once | INTRAMUSCULAR | Status: AC
  Administered 2021-11-02: 40 mg via INTRA_ARTICULAR

## 2021-11-02 NOTE — Addendum Note (Signed)
Addended by: Beatrix Fetters on: 11/02/2021 04:51 PM   Modules accepted: Orders

## 2021-11-02 NOTE — Progress Notes (Signed)
OFFICE VISIT  11/02/2021  CC:  Chief Complaint  Patient presents with   Abdominal Pain   Patient is a 66 y.o. female who presents accompanied by her husband Roger Kill for groin and hip and thigh pain.  HPI: This problem started back on September 4 when she had acute onset of pain in the left groin and thigh region.  This extended over the lateral aspect of the left hip.  The pain was severe intensity.  She does have the sensation that there was s encounter from 10/16/2021.  ome numbness over the anterior and lateral thigh.  The pain did not extend past the distal aspect of the thigh.  She had no significant back pain at all.  No preceding trauma or overuse. The continued to intensify and eventually she went to the emergency department.  I reviewed this Encounter dated 10/16/2021.  She had pelvis, femur, and L-spine x-rays and these were all normal. Provisional diagnosis was lumbar radiculopathy and she was given a Medrol Dosepak and muscle relaxer. The pain improved a little bit for a while but then seemed to return just as bad.  She then followed up with Dr. Claiborne Billings and there was concern of this being possible soft tissue injury, infection, or intra-articular left hip derangement. An MRI of the hip was done on 10/24/2021 and was normal.  The only trauma patient recalls is about a week prior to onset of her symptoms she had a large limb snapped back and hit the back of her thigh. She did not really have any alteration in gait after that and it did not cause any lasting pain.  No change in activity level proceeded with her pain. No recent weight gain. No excessive pressure across the lower waist.  She has no leg weakness, no loss of bowel or bladder control, no saddle anesthesia. The pain is made worse by lifting and rotating the left leg and by lying on the left hip.  ROS as above, plus--> no fevers, no CP, no SOB, no wheezing, no cough, no dizziness, no HAs, no rashes, no melena/hematochezia.  No  polyuria or polydipsia.  No myalgias or arthralgias.  No focal weakness, paresthesias, or tremors.  No acute vision or hearing abnormalities.  No dysuria or unusual/new urinary urgency or frequency.  No recent changes in lower legs. No n/v/d or abd pain.  No palpitations.     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   Injection May 04, 2018   Chronic pelvic pain in female 1995   LLQ   Chronic rhinitis    COVID-19 08/21/2020   Health maintenance examination    HSV-1 (herpes simplex virus 1) infection    Hypothyroidism    Labral tear of shoulder, degenerative, 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   Lef   Menopausal symptoms    Migraine    Osteopenia 03/2017   T score -2.1 FRAX 7.5% / 0.7%    Past Surgical History:  Procedure Laterality Date   FOOT SURGERY Right 05/31/2016   joint replacement   PELVIC LAPAROSCOPY     DIAG LAP    Outpatient Medications Prior to Visit  Medication Sig Dispense Refill   cyclobenzaprine (FLEXERIL) 10 MG tablet Take 1 tablet (10 mg total) by mouth 2 (two) times daily as needed for muscle spasms. 20 tablet 0   Estradiol (VAGIFEM) 10 MCG TABS vaginal tablet Place 1 tablet (10 mcg total) vaginally 2 (two)  times a week. Use every night before bed for two weeks when you first begin this medicine, then after the first two weeks, begin using it twice a week. 34 tablet 3   Estradiol-Norethindrone Acet 0.5-0.1 MG tablet Take 1 tablet by mouth daily. 84 tablet 3   Fremanezumab-vfrm (AJOVY) 225 MG/1.5ML SOAJ Inject 225 mg into the skin every 30 (thirty) days. 1.5 mL 11   ipratropium (ATROVENT) 0.06 % nasal spray PLACE 2 SPRAYS INTO BOTH NOSTRILS 3 (THREE) TIMES DAILY 15 mL 0   methylPREDNISolone (MEDROL DOSEPAK) 4 MG TBPK tablet See instructions on package 21 each 0   ondansetron (ZOFRAN-ODT) 4 MG disintegrating tablet Take 1-2 tablets (4-8 mg total) by mouth every 8 (eight) hours as needed for nausea. 30  tablet 11   rizatriptan (MAXALT-MLT) 10 MG disintegrating tablet Take 1 tablet (10 mg total) by mouth as needed for migraine. May repeat in 2 hours if needed. Max 2 tablets in 24 hours. 27 tablet 0   SYNTHROID 75 MCG tablet Take 1 tablet (75 mcg total) by mouth daily. On an empty stomach 90 tablet 3   valACYclovir (VALTREX) 500 MG tablet TAKE 1 TABLET TWICE A DAY AS NEEDED 30 tablet 11   oxyCODONE (ROXICODONE) 5 MG immediate release tablet Take 1 tablet (5 mg total) by mouth every 4 (four) hours as needed for severe pain. 15 tablet 0   No facility-administered medications prior to visit.    Allergies  Allergen Reactions   Augmentin [Amoxicillin-Pot Clavulanate] Nausea And Vomiting   Cefdinir Hives   Etodolac Other (See Comments)    "LODINE"   Phenergan [Promethazine Hcl]     Restlessness, twitching    ROS As per HPI  PE:    11/02/2021   11:31 AM 11/02/2021   11:23 AM 10/22/2021   11:22 AM  Vitals with BMI  Height   5\' 2"   Weight  125 lbs 125 lbs  BMI  25.85 27.78  Systolic 242 353 614  Diastolic 80 82 84  Pulse  67 93   Physical Exam  Gen: Alert, well appearing.  Patient is oriented to person, place, time, and situation. Affect is anxious, she is in severe acute pain. She is able to limp to the exam table and get up slowly. Single-leg stand on the right leg elicits more pain on her left leg.  Single-leg stand on the left leg elicits severe pain in the posterolateral left hip. No low back or SI joint tenderness.  She has moderate to severe tenderness to palpation in the lateral aspect of the left thigh extending up over the greater trochanter and somewhat posterior to this in the glutes. Severe pain with left hip flexion extension as well as internal and external rotation. Strength 5 out of 5 in proximal and distal lower extremities bilaterally. Abdomen is nontender.  Groin is nontender. Skin: There is no rash in the area of her pain.  LABS:  Last CBC Lab Results   Component Value Date   WBC 9.1 10/22/2021   HGB 13.7 10/22/2021   HCT 41.1 10/22/2021   MCV 91.3 10/22/2021   MCH 29.6 07/03/2015   RDW 13.7 10/22/2021   PLT 380.0 43/15/4008   Last metabolic panel Lab Results  Component Value Date   GLUCOSE 74 10/22/2021   NA 130 (L) 10/22/2021   K 4.4 10/22/2021   CL 96 10/22/2021   CO2 26 10/22/2021   BUN 18 10/22/2021   CREATININE 0.86 10/22/2021   CALCIUM 8.8 10/22/2021  PROT 6.8 01/18/2021   ALBUMIN 4.3 01/18/2021   BILITOT 0.5 01/18/2021   ALKPHOS 40 01/18/2021   AST 14 01/18/2021   ALT 9 01/18/2021   IMPRESSION AND PLAN:  Left hip and thigh pain. The intensity of her pain suggest more of a lumbar radiculopathy. However, her exam does not support this. Gluteal tendon/trochanteric bursa seems to be her pain generator, although it is a more intense pain than typically associated with this.  Meralgia paresthetica again fits her distribution but I think is lower on the list.  Diagnostic injection into the greater trochanter region brought significant relief of her pain today. We will see how this goes through the weekend and potentially get her into physical therapy next week. If her pain is only temporarily relieved and it returns to its previous intensity then will proceed with MRI of the low back.  Did send in gabapentin 300 mg for her to take if her pain returns severe this weekend.  I also sent in #15 more of the 10-325 Percocet.  Ultrasound-guided injection is preferred based on studies that show increased duration, increased effect, greater accuracy, decreased procedural pain, increased response rate, and decreased cost with ultrasound-guided versus blind injection. Procedure: Real-time ultrasound guided injection of left trochanteric bursa/gluteal tendons. Device: GE [employer] informed consent obtained.  Timeout conducted.  No overlying erythema, induration, or other signs of local infection. After sterile prep with  Betadine, injected 3 cc of 1% lidocaine without epinephrine for anesthesia.  I then injected mixture of 40 mg triamcinolone and 3 mL of 1% lidocaine. injectate seen filling the trochanteric bursa. Patient tolerated the procedure well.  No immediate complications.  Post-injection care discussed. Advised to call if fever/chills, erythema, drainage, or persistent bleeding.  Impression: Technically successful ultrasound-guided injection.  An After Visit Summary was printed and given to the patient.  FOLLOW UP: Return for TBD.  Signed:  Santiago Bumpers, MD           11/02/2021

## 2021-11-06 ENCOUNTER — Encounter: Payer: Self-pay | Admitting: Family Medicine

## 2021-11-06 NOTE — Telephone Encounter (Signed)
Please advise 

## 2021-11-07 NOTE — Telephone Encounter (Signed)
Pt called inquiring about an MRI. Please contact patient with information.

## 2021-11-08 ENCOUNTER — Encounter: Payer: Self-pay | Admitting: Neurology

## 2021-11-08 ENCOUNTER — Telehealth (INDEPENDENT_AMBULATORY_CARE_PROVIDER_SITE_OTHER): Payer: PRIVATE HEALTH INSURANCE | Admitting: Neurology

## 2021-11-08 DIAGNOSIS — G43701 Chronic migraine without aura, not intractable, with status migrainosus: Secondary | ICD-10-CM

## 2021-11-08 MED ORDER — AJOVY 225 MG/1.5ML ~~LOC~~ SOAJ: 225.0000 mg | SUBCUTANEOUS | 4 refills | Status: DC

## 2021-11-08 MED ORDER — RIZATRIPTAN BENZOATE 10 MG PO TBDP: 10.0000 mg | ORAL_TABLET | ORAL | 4 refills | Status: DC | PRN

## 2021-11-08 NOTE — Progress Notes (Signed)
[facility]    Provider:  Dr Lucia Gaskins Referring Provider: Natalia Leatherwood, DO Primary Care Physician:  Natalia Leatherwood, DO  CC:  Migraines  Virtual Visit via Video Note  I connected with Heather Chambers on 11/08/21 at  3:00 PM EDT by a video enabled telemedicine application and verified that I am speaking with the correct person using two identifiers.  Location: Patient: home Provider: office   I discussed the limitations of evaluation and management by telemedicine and the availability of in person appointments. The patient expressed understanding and agreed to proceed.    I discussed the assessment and treatment plan with the patient. The patient was provided an opportunity to ask questions and all were answered. The patient agreed with the plan and demonstrated an understanding of the instructions.   The patient was advised to call back or seek an in-person evaluation if the symptoms worsen or if the condition fails to improve as anticipated.  I provided 10 minutes of non-face-to-face time during this encounter.   Anson Fret, MD  Interval history 11/08/2021: Doing well on Ajovy, no significant headaches, she may change insurance she is going to be retiring early and if she can't get the ajovy until she goes on medicare in a year we can always supplement with samples. Let us know about her new insurance.   Interval history 11/02/2020: Rizatriptan looks great. She is doing great on Ajovy. She takes citrocel and that helps with the constipation.  She was on Aimovig initially and could not tolerate due to the constipation but Ajovy is at least better.  She likes the Maxalt, so much better than the Imitrex as far side effects ago, she takes the Ajovy every 30 days, if she has a headache near the end when it is wearing off the Maxalt helps tremendously, I told her she could take the Zofran alone or with the Maxalt or if she is having really bad migraine she can take the  Maxalt with the ondansetron as well as an Aleve or other over-the-counter analgesic.  We discussed that we could always try Emgality, it is a faster injection (the Ajovy does burn going in) and it may not cause as much constipation is hard to say people are different, I also talked about Nurtec and Bernita Raisin is acute management, but at this time patient feels like she is doing really well we will schedule her for a follow-up in 1 year.  Interval history: Patient did great on Ajovy but could not get it approved. Will try again. Failed multiple medications, she [facility] and failed at least 3 classes: amitriptyline(side effects),verapamil(hypotension), propranolol contraindicated due to hypotension, topiramate(side effects), Aimovig gave her constipation. Ajovy was better. Tried sumatriptan, maxalt.   HPI:  Heather Chambers is a 66 y.o. female here as a referral from Dr. Claiborne Billings for recurrent migraines. She went to the [facility] for [facility] and tried and failed multiple medications. Migraines started in college. It was so painful she thought she had a brain tumor. Mother has migraines with aura and vision loss but less so headaches. Patient has severe headaches. She went to the [facility] over 10 years ago. For 3 years she went every 3 months, they tried multiple medications and failed multiple medications, the medication caused lots of GI problems.  Imitrex is the only thing that works. Triggered by tension and stress in the evenings. Migraines start in the evenings and  she wakes up overnight. It starts at night then it is severe in the morning. Always towards the end of the week. Can last up to 4 days before resolving. Pain can be severe. Imitrex makes her "fuzzy". They are on the right side, also sinus issues may contribute. The migraines are pounding, throbbing with light and sound sensitivity. She has 8 migraine days a month and 15 headache days a  month total. No medication.  She has had an MRI in the past and it was negative.    Reviewed notes, labs and imaging from outside physicians, which showed:  TSH 0.31 (follows with pcp), bmp, cbc, hgba1c normal  Reviewed report CT sinuses 07/2004: Clinical Data: Pain under eyes and above left eye on and off for months.   LIMITED CT OF PARANASAL SINUSES - 07/16/04:   Technique: Limited coronal CT images were obtained through the paranasal sinuses without intravenous contrast.  Findings: Imaged paranasal sinuses are clear. No bony sclerosis or thickening to suggest chronic sinusitis. Imaged intracranial contents appear normal. Imaged appearance of the orbits is normal.  IMPRESSION:   Negative study.    Provider: Fredderick Severance  ENT CT 09/2015: reviewed report   FINDINGS: SINUSES: Mild LEFT maxillary sinus mucosal thickening along the floor. Ethmoid, sphenoid and frontal sinuses are well aerated. LEFT onodi air cell. Bilateral frontal sinus air cells. LEFT concha lamella. No abnormal antral expansion or atresia, bony wall thickening/mucoperiosteal reaction. Nasal septum is midline.  OSTIA: RIGHT ostiomeatal unit, frontal and sphenoid ethmoidal recesses are patent. Soft tissue opacifies LEFT ostiomeatal unit.  FACIAL BONES: No acute facial fracture. No destructive bony lesions.  ORBITS: Ocular globes and orbital contents are unremarkable.  IMPRESSION: Mild LEFT maxillary sinus mucosal thickening and soft tissue effacement LEFT ostiomeatal unit.   Review of Systems: Patient complains of symptoms per HPI as well as the following symptoms: headache. Pertinent negatives and positives per HPI. All others negative    Social History   Socioeconomic History   Marital status: Married    Spouse name: Not on file   Number of children: 1   Years of education: Not on file   Highest education level: Master's degree (e.g., MA, MS, MEng, MEd, MSW, MBA)  Occupational History    Employer:  [employer]  Tobacco Use   Smoking status: Never   Smokeless tobacco: Never  Vaping Use   Vaping Use: Never used  Substance and Sexual Activity   Alcohol use: Yes    Alcohol/week: 4.0 standard drinks of alcohol    Types: 4 Cans of beer per week    Comment: wine and beer   Drug use: No   Sexual activity: Yes    Birth control/protection: Post-menopausal  Other Topics Concern   Not on file  Social History Narrative   Ms. Krasinski lives with her husband. She has a grown son who recently moved to [place] for a job. 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    Wears seatbelt.    Exercises 3x a week.    Smoke alarm in the home.    Feels safe in relationships   Right handed   Drinks decaf tea and coffee   Social Determinants of Health   Financial Resource Strain: Low Risk  (10/21/2021)   Overall Financial Resource Strain (CARDIA)    Difficulty of Paying Living Expenses: Not hard at all  Food Insecurity: No Food Insecurity (10/21/2021)   Hunger Vital Sign  Worried About Charity fundraiser in the Last Year: Never true    [place] in the Last Year: Never true  Transportation Needs: No Transportation Needs (10/21/2021)   PRAPARE - Hydrologist (Medical): No    Lack of Transportation (Non-Medical): No  Physical Activity: Sufficiently Active (10/21/2021)   Exercise Vital Sign    Days of Exercise per Week: 5 days    Minutes of Exercise per Session: 40 min  Stress: No Stress Concern Present (10/21/2021)   [place]    Feeling of Stress : Only a little  Social Connections: Moderately Integrated (10/21/2021)   Social Connection and Isolation Panel [NHANES]    Frequency of Communication with Friends and Family: More than three times a week    Frequency of Social Gatherings with Friends and Family: Once a week    Attends Religious  Services: Never    Marine scientist or Organizations: Yes    Attends Music therapist: More than 4 times per year    Marital Status: Married  Human resources officer Violence: Not on file    Family History  Problem Relation Age of Onset   Allergic rhinitis Mother    Migraines Mother    Osteoarthritis Mother    Breast cancer Sister 71   Breast cancer Maternal Grandmother 55   Allergic rhinitis Maternal Grandmother    Heart disease Maternal Grandfather    Angioedema Neg Hx    Asthma Neg Hx    Atopy Neg Hx    Eczema Neg Hx    Immunodeficiency Neg Hx    Urticaria Neg Hx     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   Injection May 04, 2018   Chronic pelvic pain in female 1995   LLQ   Chronic rhinitis    COVID-19 08/21/2020   Health maintenance examination    HSV-1 (herpes simplex virus 1) infection    Hypothyroidism    Labral tear of shoulder, degenerative, 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 20 tablet 0   Estradiol (VAGIFEM) 10 MCG TABS vaginal tablet Place 1 tablet (10 mcg total) vaginally 2 (two) times a week. Use every night before bed for two weeks when you first begin this medicine, then after the first two weeks, begin using it twice a week. 34 tablet 3   Estradiol-Norethindrone Acet 0.5-0.1 MG tablet Take 1 tablet by mouth daily. 84 tablet 3   Fremanezumab-vfrm (AJOVY) 225 MG/1.5ML SOAJ Inject 225 mg into the skin every 30 (thirty) days. 4.5 mL 4   gabapentin  (NEURONTIN) 300 MG capsule Take 1 capsule (300 mg total) by mouth 3 (three) times daily. 15 capsule 0   ipratropium (ATROVENT) 0.06 % nasal spray PLACE 2 SPRAYS INTO BOTH NOSTRILS 3 (THREE) TIMES DAILY 15 mL 0   methylPREDNISolone (MEDROL DOSEPAK) 4 MG  TBPK tablet See instructions on package 21 each 0   ondansetron (ZOFRAN-ODT) 4 MG disintegrating tablet Take 1-2 tablets (4-8 mg total) by mouth every 8 (eight) hours as needed for nausea. 30 tablet 11   oxyCODONE-acetaminophen (PERCOCET) 10-325 MG tablet 1/2- 1 tab po q6h prn severe pain 15 tablet 0   rizatriptan (MAXALT-MLT) 10 MG disintegrating tablet Take 1 tablet (10 mg total) by mouth as needed for migraine. May repeat in 2 hours if needed. Max 2 tablets in 24 hours. 27 tablet 4   SYNTHROID 75 MCG tablet Take 1 tablet (75 mcg total) by mouth daily. On an empty stomach 90 tablet 3   valACYclovir (VALTREX) 500 MG tablet TAKE 1 TABLET TWICE A DAY AS NEEDED 30 tablet 11   No current facility-administered medications for this visit.    Allergies as of 11/08/2021 - Review Complete 11/02/2021  Allergen Reaction Noted   Augmentin [amoxicillin-pot clavulanate] Nausea And Vomiting 05/30/2015   Cefdinir Hives 11/20/2007   Etodolac Other (See Comments) 11/20/2007   Phenergan [promethazine hcl]  09/13/2012    Vitals: LMP 02/12/2008 (Approximate)  Last Weight:  Wt Readings from Last 1 Encounters:  11/02/21 125 lb (56.7 kg)   Last Height:   Ht Readings from Last 1 Encounters:  10/22/21 5\' 2"  (1.575 m)    Physical exam: Exam: Gen: NAD, conversant      CV: Denies palpitations or chest pain or SOB. VS: Breathing at a normal rate. Weight appears within normal limits. Not febrile. Eyes: Conjunctivae clear without exudates or hemorrhage  Neuro: Detailed Neurologic Exam  Speech:    Speech is normal; fluent and spontaneous with normal comprehension.  Cognition:    The patient is oriented to person, place, and time;     recent and remote  memory intact;     language fluent;     normal attention, concentration,     fund of knowledge Cranial Nerves:     Visual fields are full to finger confrontation. Extraocular movements are intact.  The face is symmetric with normal sensation. The palate elevates in the midline. Hearing intact. Voice is normal. Shoulder shrug is normal. The tongue has normal motion without fasciculations.   Coordination:    Normal finger to nose  Gait:    Normal native gait  Motor Observation:   no involuntary movements noted. Tone:    Appears normal  Posture:    Posture is normal. normal erect    Strength:    Strength is anti-gravity and symmetric in the upper and lower limbs.      Sensation: intact to LT      Assessment/Plan:  66 year old, chronic migraines, doing great on Ajovy, no significant headaches, she may change insurance she is going to be retiring early and if she can't get the ajovy until she goes on medicare in a year we can always supplement with samples for a few months. Let 64 know about her new insurance.   -Aimovig caused severe constipation, continue Ajovy for prevention doing great - Acute management: Rizatriptan.  Loves rizatriptan, at the end of the month when she starts feeling the Ajovy wear off it works well, told her she could take it with ondansetron and Aleve or an over-the-counter analgesic if needed, also discussed Ubrelvy and Nurtec, right now she is happy the way things are    Meds ordered this encounter  Medications   Fremanezumab-vfrm (AJOVY) 225 MG/1.5ML SOAJ    Sig: Inject 225 mg into the skin every  30 (thirty) days.    Dispense:  4.5 mL    Refill:  4    Patient has copay card; she can have medication  regardless of insurance approval or copay amount.If insurance will pay, please dispense a 90-day supply with 4 refills.   rizatriptan (MAXALT-MLT) 10 MG disintegrating tablet    Sig: Take 1 tablet (10 mg total) by mouth as needed for migraine. May repeat in  2 hours if needed. Max 2 tablets in 24 hours.    Dispense:  27 tablet    Refill:  4    If insurance will pay, please dispense a 90-day supply of 27 tablets.    Discussed: To prevent or relieve headaches, try the following: Cool Compress. Lie down and place a cool compress on your head.  Avoid headache triggers. If certain foods or odors seem to have triggered your migraines in the past, avoid them. A headache diary might help you identify triggers.  Include physical activity in your daily routine. Try a daily walk or other moderate aerobic exercise.  Manage stress. Find healthy ways to cope with the stressors, such as delegating tasks on your to-do list.  Practice relaxation techniques. Try deep breathing, yoga, massage and visualization.  Eat regularly. Eating regularly scheduled meals and maintaining a healthy diet might help prevent headaches. Also, drink plenty of fluids.  Follow a regular sleep schedule. Sleep deprivation might contribute to headaches Consider biofeedback. With this mind-body technique, you learn to control certain bodily functions -- such as muscle tension, heart rate and blood pressure -- to prevent headaches or reduce headache pain.    Proceed to emergency room if you experience new or worsening symptoms or symptoms do not resolve, if you have new neurologic symptoms or if headache is severe, or for any concerning symptom.   Provided education and documentation from American headache Society toolbox including articles on: chronic migraine medication overuse headache, chronic migraines, prevention of migraines, behavioral and other nonpharmacologic treatments for headache.    Naomie DeanAntonia Xzayvion Vaeth, MD  [facility] [address] [place], [place] [id number]  Phone [phone] Fax [phone]

## 2021-11-23 ENCOUNTER — Encounter: Payer: Self-pay | Admitting: Family Medicine

## 2021-11-23 ENCOUNTER — Ambulatory Visit: Payer: PRIVATE HEALTH INSURANCE | Admitting: Family Medicine

## 2021-11-23 VITALS — BP 122/71 | HR 76 | Temp 98.3°F | Wt 121.2 lb

## 2021-11-23 DIAGNOSIS — G5712 Meralgia paresthetica, left lower limb: Secondary | ICD-10-CM

## 2021-11-23 NOTE — Patient Instructions (Signed)
Start physical therapy.  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

## 2021-11-23 NOTE — Progress Notes (Unsigned)
Heather Chambers , [birth year], 66 y.o., female MRN: [id number] Patient Care Team    Relationship Specialty Notifications Start End  Natalia Leatherwood, DO PCP - General Family Medicine  01/09/15   Harrington Challenger, NP (Inactive) Nurse Practitioner Obstetrics and Gynecology  01/18/16   Rachael Fee, MD Attending Physician Gastroenterology  01/18/16   Bobbitt, Heywood Iles, MD Consulting Physician Allergy and Immunology  01/22/17     Chief Complaint  Patient presents with   Leg Pain     Subjective: Heather Chambers is 66 y.o. female present to the [facility] left lateral leg pain.  Patient reports she has seen a great deal of improvement.  She is concerned she still can have a flare and is unable to return to her normal activity. She reports the steroid injection she received last week from other provider may have provided a little relief, but feels the pain is more nerve related. Prior note: Pt presents for an OV with complaints of left hip pain.  She was seen in the ED on September 5 with extreme left-sided hip pain without known injury.  Patient states the pain started on September 2, and she had no injury that she was aware of.  She has had x-rays of her pelvis, femur and lumbar spine that did not show cause of acute pain.  Patient was prescribed oxycodone in the ED and reported that it was not helping her pain.  She was unable to stand even in the shower.  We increased her pain meds over the weekend to see an in person evaluation today, since Friday's appointment was virtual.  She states since using the increased pain medicine, muscle relaxant and steroid she sees a mild improvement in her pain level.  However she is still unable to bear weight for longer periods of time.  She was able to get up out of bed on her own today and dressed without assistance.  That being said, she states she could feel the pain starting to build even with those minor activities.  Pain starts at the lateral bony prominence of  the femur and she will feel tightness down the anterior thigh.  She also describes having some's paresthesia/numbness tingling on her anterior thigh over the weekend.  He reports that felt like there was tape or something stuck to her leg mid anterior thigh.  Prior note: Patient was seen in the emergency room 10/16/2021 for radicular pain.  At that visit she had concerns over left-sided hip pain that had started the Saturday prior to that visit.  The pain reportedly radiated to her left knee and was located in her front and lateral portion of her leg.  Pain was worse when walking and straightening her leg, as well as sitting.  She has had a history of sciatica in the past, but reported that the pain she was experiencing was different than her sciatic pain. She was prescribed Medrol Dosepak, Flexeril and oxycodone. Fentanyl injection and hydrocodone 5-3 25 during ED visit.  Patient reports her pain is very severe.  She points near hip flexor/left lateral hip area.  She reports she cannot stand.  She try to get a shower and the pain was so excruciating she had to go back to bed.  She denies any known injury prior to onset of symptoms.  She states she did notice 2 weeks ago she had left lower groin pain, more so in her abdominal area that she thought there was  something wrong with a "organ.  "She states that seems to have resolved.  She then was doing some work out in the yard and her husband states there was a branch or a stick that she was trying to move that came back in hit her in the left thigh.  It sounds like may be more lateral but in the same general location as current discomfort.  She reports she bruised over that area, but did not feel it was significant enough to cause current symptoms.  He does feel her leg is swollen, she reports no redness but notices her underwear is tighter on the left leg currently.  She denies any fevers or chills.  She states they checked her circulation quite well in the  emergency room and the circulation appeared to be normal in her legs.  No lab work was completed.  She reports when she straightens her leg it causes pain down to her knee and it feels tight and "pulling.  "She has been taking the Oxy IR 5 mg when the pain gets severe and she does not feel like it is touching it.  She did not want to take the Oxy 3 4 to 6 hours because she was fearful of the medication.  She states she has been going through the Advil quite a bit.  Her husband states she is even unable to sit at the dinner table without severe pressure and pain.     01/18/2021    9:34 AM 01/27/2020    9:34 AM 02/10/2019    8:36 AM 01/23/2018    1:06 PM 01/22/2017    1:01 PM  Depression screen PHQ 2/9  Decreased Interest 0 0 0 0 0  Down, Depressed, Hopeless 0 0 0 0 0  PHQ - 2 Score 0 0 0 0 0    Allergies  Allergen Reactions   Augmentin [Amoxicillin-Pot Clavulanate] Nausea And Vomiting   Cefdinir Hives   Etodolac Other (See Comments)    "LODINE"   Phenergan [Promethazine Hcl]     Restlessness, twitching   Social History   Social History Narrative   Ms. Chopin lives with her husband. She has a grown son who recently moved to [place] for a job. 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    Wears seatbelt.    Exercises 3x a week.    Smoke alarm in the home.    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   Lef   Menopausal symptoms    Migraine    Osteopenia 03/2017   T score -2.1 FRAX 7.5% / 0.7%   Past Surgical History:   Procedure Laterality Date   FOOT SURGERY Right 05/31/2016   joint replacement   PELVIC LAPAROSCOPY     DIAG LAP   Family History  Problem Relation Age of Onset   Allergic rhinitis Mother    Migraines Mother    Osteoarthritis Mother    Breast cancer  Sister 67   Breast cancer Maternal Grandmother 55   Allergic rhinitis Maternal Grandmother    Heart disease Maternal Grandfather    Angioedema Neg Hx    Asthma Neg Hx    Atopy Neg Hx    Eczema Neg Hx    Immunodeficiency Neg Hx    Urticaria Neg Hx    Allergies as of 11/23/2021       Reactions   Augmentin [amoxicillin-pot Clavulanate] Nausea And Vomiting   Cefdinir Hives   Etodolac Other (See Comments)   "LODINE"   Phenergan [promethazine Hcl]    Restlessness, twitching        Medication List        Accurate as of November 23, 2021  1:57 PM. If you have any questions, ask your nurse or doctor.          Ajovy 225 MG/1.5ML Soaj Generic drug: Fremanezumab-vfrm Inject 225 mg into the skin every 30 (thirty) days.   cyclobenzaprine 10 MG tablet Commonly known as: FLEXERIL Take 1 tablet (10 mg total) by mouth 2 (two) times daily as needed for muscle spasms.   Estradiol 10 MCG Tabs vaginal tablet Commonly known as: Vagifem Place 1 tablet (10 mcg total) vaginally 2 (two) times a week. Use every night before bed for two weeks when you first begin this medicine, then after the first two weeks, begin using it twice a week.   Estradiol-Norethindrone Acet 0.5-0.1 MG tablet Take 1 tablet by mouth daily.   gabapentin 300 MG capsule Commonly known as: NEURONTIN Take 1 capsule (300 mg total) by mouth 3 (three) times daily.   ipratropium 0.06 % nasal spray Commonly known as: ATROVENT PLACE 2 SPRAYS INTO BOTH NOSTRILS 3 (THREE) TIMES DAILY   methylPREDNISolone 4 MG Tbpk tablet Commonly known as: MEDROL DOSEPAK See instructions on package   ondansetron 4 MG disintegrating tablet Commonly known as: ZOFRAN-ODT Take 1-2  tablets (4-8 mg total) by mouth every 8 (eight) hours as needed for nausea.   oxyCODONE-acetaminophen 10-325 MG tablet Commonly known as: PERCOCET 1/2- 1 tab po q6h prn severe pain   rizatriptan 10 MG disintegrating tablet Commonly known as: MAXALT-MLT Take 1 tablet (10 mg total) by mouth as needed for migraine. May repeat in 2 hours if needed. Max 2 tablets in 24 hours.   Synthroid 75 MCG tablet Generic drug: levothyroxine Take 1 tablet (75 mcg total) by mouth daily. On an empty stomach   valACYclovir 500 MG tablet Commonly known as: VALTREX TAKE 1 TABLET TWICE A DAY AS NEEDED        All past medical history, surgical history, allergies, family history, immunizations andmedications were updated in the EMR today and reviewed under the history and medication portions of their EMR.     ROS Negative, with the exception of above mentioned in HPI   Objective:  LMP 02/12/2008 (Approximate)  There is no height or weight on file to calculate BMI. Physical Exam Vitals and nursing note reviewed.  Constitutional:      General: She is not in acute distress.    Appearance: Normal appearance. She is normal weight. She is not ill-appearing or toxic-appearing.  Eyes:     Extraocular Movements: Extraocular movements intact.     Conjunctiva/sclera: Conjunctivae normal.     Pupils: Pupils are equal, round, and reactive to light.  Musculoskeletal:        General: No swelling, tenderness or deformity. Normal range of motion.     Right lower leg: No edema.  Left lower leg: No edema.     Comments: Negative straight leg raise bilaterally.  Neurological:     Mental Status: She is alert and oriented to person, place, and time. Mental status is at baseline.  Psychiatric:        Mood and Affect: Mood normal.        Behavior: Behavior normal.        Thought Content: Thought content normal.        Judgment: Judgment normal.      No results found. No results found. No results found for  this or any previous visit (from the past 24 hour(s)).  Assessment/Plan: Heather Chambers is a 66 y.o. female present for OV for  Meralgia paresthetica of left side We discussed meralgia paresthetica today in detail and I feel her exam is most consistent with this diagnosis now.  I shared with her the difference between radiculopathy, dermatome patterns and the pattern of discomfort from neuralgia paresthetica and she agrees with diagnoses.  We discussed referral to physical therapy to help strengthen the support of muscles, in hopes to prevent further exacerbations and she is open to this idea. - Ambulatory referral to Physical Therapy  Reviewed expectations re: course of current medical issues. Discussed self-management of symptoms. Outlined signs and symptoms indicating need for more acute intervention. Patient verbalized understanding and all questions were answered. Patient received an After-Visit Summary.    No orders of the defined types were placed in this encounter.  No orders of the defined types were placed in this encounter.  Referral Orders  No referral(s) requested today     Note is dictated utilizing voice recognition software. Although note has been proof read prior to signing, occasional typographical errors still can be missed. If any questions arise, please do not hesitate to call for verification.   electronically signed by:  Felix Pacini, DO  [facility] [place]

## 2021-11-27 ENCOUNTER — Ambulatory Visit (INDEPENDENT_AMBULATORY_CARE_PROVIDER_SITE_OTHER): Payer: PRIVATE HEALTH INSURANCE

## 2021-11-27 ENCOUNTER — Other Ambulatory Visit: Payer: Self-pay | Admitting: Obstetrics and Gynecology

## 2021-11-27 DIAGNOSIS — M8589 Other specified disorders of bone density and structure, multiple sites: Secondary | ICD-10-CM

## 2021-11-27 DIAGNOSIS — Z13820 Encounter for screening for osteoporosis: Secondary | ICD-10-CM | POA: Diagnosis not present

## 2021-11-27 DIAGNOSIS — Z780 Asymptomatic menopausal state: Secondary | ICD-10-CM

## 2021-11-27 DIAGNOSIS — M8580 Other specified disorders of bone density and structure, unspecified site: Secondary | ICD-10-CM

## 2021-12-03 ENCOUNTER — Encounter: Payer: Self-pay | Admitting: *Deleted

## 2021-12-03 ENCOUNTER — Other Ambulatory Visit: Payer: Self-pay | Admitting: *Deleted

## 2021-12-03 DIAGNOSIS — G43701 Chronic migraine without aura, not intractable, with status migrainosus: Secondary | ICD-10-CM

## 2021-12-03 MED ORDER — RIZATRIPTAN BENZOATE 10 MG PO TBDP: 10.0000 mg | ORAL_TABLET | ORAL | 2 refills | Status: DC | PRN

## 2021-12-27 ENCOUNTER — Other Ambulatory Visit: Payer: Self-pay | Admitting: Family Medicine

## 2022-01-18 ENCOUNTER — Other Ambulatory Visit: Payer: Self-pay | Admitting: Family Medicine

## 2022-01-18 ENCOUNTER — Ambulatory Visit (INDEPENDENT_AMBULATORY_CARE_PROVIDER_SITE_OTHER): Payer: PRIVATE HEALTH INSURANCE | Admitting: Family Medicine

## 2022-01-18 ENCOUNTER — Encounter: Payer: Self-pay | Admitting: Family Medicine

## 2022-01-18 VITALS — BP 129/80 | HR 82 | Temp 97.8°F | Ht 62.75 in | Wt 118.0 lb

## 2022-01-18 DIAGNOSIS — E034 Atrophy of thyroid (acquired): Secondary | ICD-10-CM

## 2022-01-18 DIAGNOSIS — Z0000 Encounter for general adult medical examination without abnormal findings: Secondary | ICD-10-CM | POA: Diagnosis not present

## 2022-01-18 DIAGNOSIS — B001 Herpesviral vesicular dermatitis: Secondary | ICD-10-CM | POA: Diagnosis not present

## 2022-01-18 DIAGNOSIS — M8580 Other specified disorders of bone density and structure, unspecified site: Secondary | ICD-10-CM | POA: Insufficient documentation

## 2022-01-18 DIAGNOSIS — B009 Herpesviral infection, unspecified: Secondary | ICD-10-CM | POA: Diagnosis not present

## 2022-01-18 DIAGNOSIS — Z79890 Hormone replacement therapy (postmenopausal): Secondary | ICD-10-CM | POA: Diagnosis not present

## 2022-01-18 DIAGNOSIS — J3089 Other allergic rhinitis: Secondary | ICD-10-CM

## 2022-01-18 LAB — CBC
HCT: 39.1 % (ref 36.0–46.0)
Hemoglobin: 12.9 g/dL (ref 12.0–15.0)
MCHC: 33 g/dL (ref 30.0–36.0)
MCV: 91.4 fl (ref 78.0–100.0)
Platelets: 379 10*3/uL (ref 150.0–400.0)
RBC: 4.27 Mil/uL (ref 3.87–5.11)
RDW: 14.6 % (ref 11.5–15.5)
WBC: 9.2 10*3/uL (ref 4.0–10.5)

## 2022-01-18 LAB — LIPID PANEL
Cholesterol: 161 mg/dL (ref 0–200)
HDL: 68.3 mg/dL (ref >39.00)
LDL Cholesterol: 80 mg/dL (ref 0–99)
NonHDL: 92.96
Total CHOL/HDL Ratio: 2
Triglycerides: 67 mg/dL (ref 0.0–149.0)
VLDL: 13.4 mg/dL (ref 0.0–40.0)

## 2022-01-18 LAB — T4, FREE: Free T4: 1.01 ng/dL (ref 0.60–1.60)

## 2022-01-18 LAB — TSH: TSH: 0.54 u[IU]/mL (ref 0.35–5.50)

## 2022-01-18 LAB — COMPREHENSIVE METABOLIC PANEL
ALT: 11 U/L (ref 0–35)
AST: 14 U/L (ref 0–37)
Albumin: 4.5 g/dL (ref 3.5–5.2)
Alkaline Phosphatase: 41 U/L (ref 39–117)
BUN: 12 mg/dL (ref 6–23)
CO2: 30 mEq/L (ref 19–32)
Calcium: 9.2 mg/dL (ref 8.4–10.5)
Chloride: 101 mEq/L (ref 96–112)
Creatinine, Ser: 0.8 mg/dL (ref 0.40–1.20)
GFR: 77.93 mL/min (ref >60.00)
Glucose, Bld: 91 mg/dL (ref 70–99)
Potassium: 4.3 mEq/L (ref 3.5–5.1)
Sodium: 134 mEq/L — ABNORMAL LOW (ref 135–145)
Total Bilirubin: 0.6 mg/dL (ref 0.2–1.2)
Total Protein: 6.8 g/dL (ref 6.0–8.3)

## 2022-01-18 LAB — HEMOGLOBIN A1C: Hgb A1c MFr Bld: 5.3 % (ref 4.6–6.5)

## 2022-01-18 LAB — VITAMIN D 25 HYDROXY (VIT D DEFICIENCY, FRACTURES): VITD: 31.03 ng/mL (ref 30.00–100.00)

## 2022-01-18 MED ORDER — SYNTHROID 75 MCG PO TABS: 75.0000 ug | ORAL_TABLET | Freq: Every day | ORAL | 3 refills | Status: DC

## 2022-01-18 MED ORDER — VALACYCLOVIR HCL 500 MG PO TABS: ORAL_TABLET | ORAL | 11 refills | Status: DC

## 2022-01-18 MED ORDER — IPRATROPIUM BROMIDE 0.06 % NA SOLN: 2.0000 | Freq: Three times a day (TID) | NASAL | 5 refills | Status: DC

## 2022-01-18 NOTE — Patient Instructions (Addendum)
Return in about 1 year (around 01/20/2023).        Great to see you today.  I have refilled the medication(s) we provide.   If labs were collected, we will inform you of lab results once received either by echart message or telephone call.   - echart message- for normal results that have been seen by the patient already.   - telephone call: abnormal results or if patient has not viewed results in their echart.  Health Maintenance, Female Adopting a healthy lifestyle and getting preventive care are important in promoting health and wellness. Ask your health care provider about: The right schedule for you to have regular tests and exams. Things you can do on your own to prevent diseases and keep yourself healthy. What should I know about diet, weight, and exercise? Eat a healthy diet  Eat a diet that includes plenty of vegetables, fruits, low-fat dairy products, and lean protein. Do not eat a lot of foods that are high in solid fats, added sugars, or sodium. Maintain a healthy weight Body mass index (BMI) is used to identify weight problems. It estimates body fat based on height and weight. Your health care provider can help determine your BMI and help you achieve or maintain a healthy weight. Get regular exercise Get regular exercise. This is one of the most important things you can do for your health. Most adults should: Exercise for at least 150 minutes each week. The exercise should increase your heart rate and make you sweat (moderate-intensity exercise). Do strengthening exercises at least twice a week. This is in addition to the moderate-intensity exercise. Spend less time sitting. Even light physical activity can be beneficial. Watch cholesterol and blood lipids Have your blood tested for lipids and cholesterol at 64 years of age, then have this test every 5 years. Have your cholesterol levels checked more often if: Your lipid or cholesterol levels are high. You are older than  64 years of age. You are at high risk for heart disease. What should I know about cancer screening? Depending on your health history and family history, you may need to have cancer screening at various ages. This may include screening for: Breast cancer. Cervical cancer. Colorectal cancer. Skin cancer. Lung cancer. What should I know about heart disease, diabetes, and high blood pressure? Blood pressure and heart disease High blood pressure causes heart disease and increases the risk of stroke. This is more likely to develop in people who have high blood pressure readings or are overweight. Have your blood pressure checked: Every 3-5 years if you are 21-32 years of age. Every year if you are 58 years old or older. Diabetes Have regular diabetes screenings. This checks your fasting blood sugar level. Have the screening done: Once every three years after age 30 if you are at a normal weight and have a low risk for diabetes. More often and at a younger age if you are overweight or have a high risk for diabetes. What should I know about preventing infection? Hepatitis B If you have a higher risk for hepatitis B, you should be screened for this virus. Talk with your health care provider to find out if you are at risk for hepatitis B infection. Hepatitis C Testing is recommended for: Everyone born from [birth year] through 1963. Anyone with known risk factors for hepatitis C. Sexually transmitted infections (STIs) Get screened for STIs, including gonorrhea and chlamydia, if: You are sexually active and are younger than 64 years of  age. Bonita Quin are older than 64 years of age and your health care provider tells you that you are at risk for this type of infection. Your sexual activity has changed since you were last screened, and you are at increased risk for chlamydia or gonorrhea. Ask your health care provider if you are at risk. Ask your health care provider about whether you are at high risk for HIV.  Your health care provider may recommend a prescription medicine to help prevent HIV infection. If you choose to take medicine to prevent HIV, you should first get tested for HIV. You should then be tested every 3 months for as long as you are taking the medicine. Pregnancy If you are about to stop having your period (premenopausal) and you may become pregnant, seek counseling before you get pregnant. Take 400 to 800 micrograms (mcg) of folic acid every day if you become pregnant. Ask for birth control (contraception) if you want to prevent pregnancy. Osteoporosis and menopause Osteoporosis is a disease in which the bones lose minerals and strength with aging. This can result in bone fractures. If you are 43 years old or older, or if you are at risk for osteoporosis and fractures, ask your health care provider if you should: Be screened for bone loss. Take a calcium or vitamin D supplement to lower your risk of fractures. Be given hormone replacement therapy (HRT) to treat symptoms of menopause. Follow these instructions at home: Alcohol use Do not drink alcohol if: Your health care provider tells you not to drink. You are pregnant, may be pregnant, or are planning to become pregnant. If you drink alcohol: Limit how much you have to: 0-1 drink a day. Know how much alcohol is in your drink. In the [place] one drink equals one 12 oz bottle of beer (355 mL), one 5 oz glass of wine (148 mL), or one 1 oz glass of hard liquor (44 mL). Lifestyle Do not use any products that contain nicotine or tobacco. These products include cigarettes, chewing tobacco, and vaping devices, such as e-cigarettes. If you need help quitting, ask your health care provider. Do not use street drugs. Do not share needles. Ask your health care provider for help if you need support or information about quitting drugs. General instructions Schedule regular health, dental, and eye exams. Stay current with your vaccines. Tell  your health care provider if: You often feel depressed. You have ever been abused or do not feel safe at home. Summary Adopting a healthy lifestyle and getting preventive care are important in promoting health and wellness. Follow your health care provider's instructions about healthy diet, exercising, and getting tested or screened for diseases. Follow your health care provider's instructions on monitoring your cholesterol and blood pressure. This information is not intended to replace advice given to you by your health care provider. Make sure you discuss any questions you have with your health care provider. Document Revised: 06/19/2020 Document Reviewed: 06/19/2020 Elsevier Patient Education  2023 [employer].

## 2022-01-18 NOTE — Progress Notes (Signed)
Patient ID: Heather Chambers, female  DOB: 1955-11-17, 66 y.o.   MRN: [id number] Patient Care Team    Relationship Specialty Notifications Start End  Heather LeatherwoodKuneff, Brecken Dewoody A, DO PCP - General Family Medicine  01/09/15   Heather ChallengerYoung, Nancy J, NP (Inactive) Nurse Practitioner Obstetrics and Gynecology  01/18/16   Heather FeeJacobs, Daniel P, MD Attending Physician Gastroenterology  01/18/16   Bobbitt, Heywood Ilesalph Carter, MD Consulting Physician Allergy and Immunology  01/22/17     Chief Complaint  Patient presents with   Annual Exam    Pt is not fasting    Subjective: Heather Chambers is Chambers 66 y.o.  Female  present for CPE  All past medical history, surgical history, allergies, family history, immunizations, medications and social history were updated in the electronic medical record today. All recent labs, ED visits and hospitalizations within the last year were reviewed.  Health maintenance:  Colonoscopy:Dr. Christella HartiganJacobs performed last colonoscopy. cologuard 03/09/2020 which was negative. Screen again 02/2023. Mammogram: GYN, 05/2021,recent abnormal with further images ok.  FHX of breast cancer.  Cervical cancer screening: 03/2020 UTD, normal. 3-5 year f/u, has gyn.  Immunizations: UTD flu 2023, tdap UTD 2021, PNA series completed rpt > 65. Shingrix  series completed.  Infectious disease screening: HIV and Hep C completed DEXA: Osteopenia, 11/27/2021 [facility] has [facility]. Hospitalizations/ED visits: reviewed  hypothyroidism Patient reports compliance with brand Synthroid 75 mcg daily.     Hormone replacement therapy (HRT) - long term hormone replacement gyn prescribed.    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  She is using the Imitrex as needed.  She has some concerns about insurance pain medication again.   Allergic rhinitis with  probable nonallergic component Managed by allergist again.      01/18/2022    9:05 AM 01/18/2021    9:34 AM 01/27/2020    9:34 AM 02/10/2019    8:36 AM 01/23/2018    1:06 PM  Depression screen PHQ 2/9  Decreased Interest 0 0 0 0 0  Down, Depressed, Hopeless 0 0 0 0 0  PHQ - 2 Score 0 0 0 0 0       No data to display          Immunization History  Administered Date(s) Administered   Influenza Whole 11/20/2007, 11/30/2009   Influenza,inj,Quad PF,6+ Mos 10/31/2016, 11/07/2017, 12/11/2018, 12/02/2019, 11/19/2021   Influenza-Unspecified 10/31/2014, 10/29/2015, 10/31/2016, 12/10/2020   PFIZER(Purple Top)SARS-COV-2 Vaccination 05/15/2019, 06/10/2019, 01/02/2020   Pneumococcal Conjugate-13 07/19/2015   Pneumococcal Polysaccharide-23 02/24/2009   Td 02/24/2009   Tdap 01/27/2020   Zoster Recombinat (Shingrix) 02/10/2019, 02/09/2020   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   Injection May 04, 2018   Chronic pelvic pain in female 1995   LLQ   Chronic rhinitis    COVID-19 08/21/2020   Health maintenance examination    HSV-1 (herpes simplex virus 1) infection    Hypothyroidism    Labral tear of shoulder, degenerative, 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   Lef   Menopausal symptoms    Migraine    Osteopenia 03/2017   T score -2.1 FRAX 7.5% / 0.7%   Allergies  Allergen Reactions   Augmentin [Amoxicillin-Pot Clavulanate] Nausea And Vomiting   Cefdinir Hives   Etodolac  Other (See Comments)    "LODINE"   Phenergan [Promethazine Hcl]     Restlessness, twitching   Past Surgical History:  Procedure Laterality Date   FOOT SURGERY Right 05/31/2016   joint replacement   PELVIC LAPAROSCOPY     DIAG LAP   Family History  Problem Relation Age of Onset   Allergic rhinitis Mother    Migraines Mother    Osteoarthritis Mother    Breast cancer Sister 51   Breast cancer Maternal Grandmother 36   Allergic rhinitis  Maternal Grandmother    Heart disease Maternal Grandfather    Angioedema Neg Hx    Asthma Neg Hx    Atopy Neg Hx    Eczema Neg Hx    Immunodeficiency Neg Hx    Urticaria Neg Hx    Social History   Social History Narrative   Heather Chambers lives with her husband. She has Chambers grown son who recently moved to [place] for Chambers job. 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    Wears seatbelt.    Exercises 3x Chambers week.    Smoke alarm in the home.    Feels safe in relationships   Right handed   Drinks decaf tea and coffee    Allergies as of 01/18/2022       Reactions   Augmentin [amoxicillin-pot Clavulanate] Nausea And Vomiting   Cefdinir Hives   Etodolac Other (See Comments)   "LODINE"   Phenergan [promethazine Hcl]    Restlessness, twitching        Medication List        Accurate as of January 18, 2022  9:26 AM. If you have any questions, ask your nurse or doctor.          Ajovy 225 MG/1.5ML Soaj Generic drug: Fremanezumab-vfrm Inject 225 mg into the skin every 30 (thirty) days.   Estradiol 10 MCG Tabs vaginal tablet Commonly known as: Vagifem Place 1 tablet (10 mcg total) vaginally 2 (two) times Chambers week. Use every night before bed for two weeks when you first begin this medicine, then after the first two weeks, begin using it twice Chambers week.   Estradiol-Norethindrone Acet 0.5-0.1 MG tablet Take 1 tablet by mouth daily.   ipratropium 0.06 % nasal spray Commonly known as: ATROVENT Place 2 sprays into both nostrils 3 (three) times daily.   ondansetron 4 MG disintegrating tablet Commonly known as: ZOFRAN-ODT Take 1-2 tablets (4-8 mg total) by mouth every 8 (eight) hours as needed for nausea.   rizatriptan 10 MG disintegrating tablet Commonly known as: MAXALT-MLT Take 1 tablet (10 mg total) by mouth as needed for migraine. May repeat in 2 hours if needed. Max 2 tablets in 24 hours.   Synthroid 75 MCG tablet Generic drug:  levothyroxine Take 1 tablet (75 mcg total) by mouth daily. On an empty stomach   valACYclovir 500 MG tablet Commonly known as: VALTREX TAKE 1 TABLET TWICE Chambers DAY AS NEEDED        All past medical history, surgical history, allergies, family history, immunizations andmedications were updated in the EMR today and reviewed under the history and medication portions of their EMR.      ROS 14 pt review of systems performed and negative (unless mentioned in an HPI)  Objective: BP 129/80   Pulse 82   Temp 97.8 F (36.6 C) (Oral)   Ht 5' 2.75" (1.594 m)   Wt 118 lb (53.5 kg)  LMP 02/12/2008 (Approximate)   SpO2 99%   BMI 21.07 kg/m  Physical Exam Constitutional:      General: She is not in acute distress.    Appearance: Normal appearance. She is not ill-appearing or toxic-appearing.  HENT:     Head: Normocephalic and atraumatic.     Right Ear: Tympanic membrane, ear canal and external ear normal. There is no impacted cerumen.     Left Ear: Tympanic membrane, ear canal and external ear normal. There is no impacted cerumen.     Nose: No congestion or rhinorrhea.     Mouth/Throat:     Mouth: Mucous membranes are moist.     Pharynx: Oropharynx is clear. No oropharyngeal exudate or posterior oropharyngeal erythema.  Eyes:     General: No scleral icterus.       Right eye: No discharge.        Left eye: No discharge.     Extraocular Movements: Extraocular movements intact.     Conjunctiva/sclera: Conjunctivae normal.     Pupils: Pupils are equal, round, and reactive to light.  Cardiovascular:     Rate and Rhythm: Normal rate and regular rhythm.     Pulses: Normal pulses.     Heart sounds: Normal heart sounds. No murmur heard.    No friction rub. No gallop.  Pulmonary:     Effort: Pulmonary effort is normal. No respiratory distress.     Breath sounds: Normal breath sounds. No stridor. No wheezing, rhonchi or rales.  Chest:     Chest wall: No tenderness.  Abdominal:      General: Abdomen is flat. Bowel sounds are normal. There is no distension.     Palpations: Abdomen is soft. There is no mass.     Tenderness: There is no abdominal tenderness. There is no right CVA tenderness, left CVA tenderness, guarding or rebound.     Hernia: No hernia is present.  Musculoskeletal:        General: No swelling, tenderness or deformity. Normal range of motion.     Cervical back: Normal range of motion and neck supple. No rigidity or tenderness.     Right lower leg: No edema.     Left lower leg: No edema.  Lymphadenopathy:     Cervical: No cervical adenopathy.  Skin:    General: Skin is warm and dry.     Coloration: Skin is not jaundiced or pale.     Findings: No bruising, erythema, lesion or rash.  Neurological:     General: No focal deficit present.     Mental Status: She is alert and oriented to person, place, and time. Mental status is at baseline.     Cranial Nerves: No cranial nerve deficit.     Sensory: No sensory deficit.     Motor: No weakness.     Coordination: Coordination normal.     Gait: Gait normal.     Deep Tendon Reflexes: Reflexes normal.  Psychiatric:        Mood and Affect: Mood normal.        Behavior: Behavior normal.        Thought Content: Thought content normal.        Judgment: Judgment normal.      No results found.  Assessment/plan: ELYZA WHITT is Chambers 66 y.o. female present for CPE/cmc hypothyroidism - she requires name brand only Synthroid> [facility] Refills will be completed after results received.  TSH, T10f   Hormone replacement therapy (HRT) -  long term hormone replacement gyn prescribed.  Cbc, cmp, tsh, lipids.   HSV infection/cold sore Stable Continue valACYclovir (VALTREX) 500 MG tablet   Chronic migraine without aura with status migrainosus, not intractable She had been referred to neurology and started on Anjovy and maxalt  Allergic rhinitis with probable nonallergic component Atrovent refilled  prn  Osteopenia, unspecified location DEXA UTD- completed by gyn Vit d collected today  Hormone replacement therapy (HRT) - Comprehensive metabolic panel - CBC - Hemoglobin A1c - Lipid panel  Routine general medical examination at [facility] Colonoscopy:Dr. Christella Hartigan performed last colonoscopy. cologuard 03/09/2020 which was negative. Screen again 02/2023. Mammogram: GYN, 05/2021,recent abnormal with further images ok.  FHX of breast cancer.  Cervical cancer screening: 03/2020 UTD, normal. 3-5 year f/u, has gyn.  Immunizations: UTD flu 2023, tdap UTD 2021, PNA series completed rpt > 65. Shingrix  series completed.  Infectious disease screening: HIV and Hep C completed DEXA: Osteopenia, 11/27/2021 Patient was encouraged to exercise greater than 150 minutes Chambers week. Patient was encouraged to choose Chambers diet filled with fresh fruits and vegetables, and lean meats. AVS provided to patient today for education/recommendation on gender specific health and safety maintenance.   Return in about 1 year (around 01/20/2023).  Orders Placed This Encounter  Procedures   Comprehensive metabolic panel   CBC   Hemoglobin A1c   Lipid panel   TSH   T4, free   Vitamin D (25 hydroxy)    Meds ordered this encounter  Medications   valACYclovir (VALTREX) 500 MG tablet    Sig: TAKE 1 TABLET TWICE Chambers DAY AS NEEDED    Dispense:  30 tablet    Refill:  11   ipratropium (ATROVENT) 0.06 % nasal spray    Sig: Place 2 sprays into both nostrils 3 (three) times daily.    Dispense:  15 mL    Refill:  5    Referral Orders  No referral(s) requested today     Electronically signed by: Felix Pacini, DO [facility] [place]

## 2022-04-01 ENCOUNTER — Other Ambulatory Visit: Payer: Self-pay | Admitting: Obstetrics and Gynecology

## 2022-04-01 DIAGNOSIS — Z1231 Encounter for screening mammogram for malignant neoplasm of breast: Secondary | ICD-10-CM

## 2022-05-14 ENCOUNTER — Ambulatory Visit
Admission: RE | Admit: 2022-05-14 | Discharge: 2022-05-14 | Disposition: A | Discharge status: Home / Self Care | Payer: 59 | Source: Ambulatory Visit | Attending: Obstetrics and Gynecology | Admitting: Obstetrics and Gynecology

## 2022-05-14 DIAGNOSIS — Z1231 Encounter for screening mammogram for malignant neoplasm of breast: Secondary | ICD-10-CM

## 2022-05-16 ENCOUNTER — Encounter: Payer: Self-pay | Admitting: Family Medicine

## 2022-05-17 NOTE — Telephone Encounter (Signed)
Please inform pt Dr. Nunzio Cobbs is still in practice, but in [place] through [facility].   She can either make an appt with him to continue management or make an appt here for [place] to take over management. Either is fine.   If needing a new referral placed to him, please do so for her.

## 2022-07-24 DIAGNOSIS — M19072 Primary osteoarthritis, left ankle and foot: Secondary | ICD-10-CM | POA: Diagnosis not present

## 2022-07-24 DIAGNOSIS — M19071 Primary osteoarthritis, right ankle and foot: Secondary | ICD-10-CM | POA: Diagnosis not present

## 2022-07-24 DIAGNOSIS — M21962 Unspecified acquired deformity of left lower leg: Secondary | ICD-10-CM | POA: Diagnosis not present

## 2022-07-24 DIAGNOSIS — M21961 Unspecified acquired deformity of right lower leg: Secondary | ICD-10-CM | POA: Diagnosis not present

## 2022-07-24 DIAGNOSIS — L602 Onychogryphosis: Secondary | ICD-10-CM | POA: Diagnosis not present

## 2022-07-24 DIAGNOSIS — M7751 Other enthesopathy of right foot: Secondary | ICD-10-CM | POA: Diagnosis not present

## 2022-07-24 DIAGNOSIS — M205X1 Other deformities of toe(s) (acquired), right foot: Secondary | ICD-10-CM | POA: Diagnosis not present

## 2022-09-11 ENCOUNTER — Other Ambulatory Visit: Payer: Self-pay | Admitting: *Deleted

## 2022-09-11 DIAGNOSIS — Z79890 Hormone replacement therapy (postmenopausal): Secondary | ICD-10-CM

## 2022-09-11 NOTE — Telephone Encounter (Signed)
Med refill request: Estradiol-Norethindrone Acet 0.5-0.1 mg tab PO daily Last AEX: 09/20/21 -BS Next AEX: 01/06/23 -BS Last MMG (if hormonal med) 05/14/22 -BiRads 1 neg   Patient left message [facility]   Call returned to patient and left message requesting return call to confirm [facility].

## 2022-09-12 NOTE — Telephone Encounter (Signed)
Patient returned call and confirmed pharmacy on file.   Rx pended. Routing to Dr. Edward Jolly

## 2022-09-13 MED ORDER — ESTRADIOL-NORETHINDRONE ACET 0.5-0.1 MG PO TABS: 1.0000 | ORAL_TABLET | Freq: Every day | ORAL | 1 refills | Status: DC

## 2022-10-01 ENCOUNTER — Telehealth: Payer: Self-pay | Admitting: Neurology

## 2022-10-01 NOTE — Telephone Encounter (Signed)
LVM and sent mychart msg informing pt of need to reschedule 11/11/22 appt - MD out

## 2022-10-03 ENCOUNTER — Ambulatory Visit: Payer: PRIVATE HEALTH INSURANCE | Admitting: Podiatry

## 2022-10-08 ENCOUNTER — Ambulatory Visit (INDEPENDENT_AMBULATORY_CARE_PROVIDER_SITE_OTHER): Payer: Medicare Other | Admitting: Podiatry

## 2022-10-08 ENCOUNTER — Encounter: Payer: Self-pay | Admitting: Podiatry

## 2022-10-08 ENCOUNTER — Ambulatory Visit (INDEPENDENT_AMBULATORY_CARE_PROVIDER_SITE_OTHER): Payer: Medicare Other

## 2022-10-08 DIAGNOSIS — M778 Other enthesopathies, not elsewhere classified: Secondary | ICD-10-CM | POA: Diagnosis not present

## 2022-10-08 NOTE — Progress Notes (Signed)
Subjective:  Patient ID: Heather [facility], female    DOB: 05/06/1955,  MRN: [id number] HPI Chief Complaint  Patient presents with   Foot Pain    Plantar forefoot right - burning x couple months, previous implant joint surgery, seen podiatrist in June 2024-xrayed, said just inflammation, gave toe splint for night use(made foot worse), OTC orthotics, recommended custom orthotics, but wanted another opinion before paying $600   New Patient (Initial Visit)    67 y.o. female presents with the above complaint.   ROS: Denies fever chills nausea vomiting muscle aches pains calf pain back pain chest pain shortness of breath.  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 Use every night before bed for two weeks when you first begin this medicine, then after the first two weeks, begin using it twice a week., Disp: 34 tablet, Rfl: 3   Estradiol-Norethindrone Acet 0.5-0.1 MG tablet, Take 1 tablet by mouth daily., Disp: 84 tablet, Rfl: 1   Fremanezumab-vfrm (AJOVY) 225 MG/1.5ML SOAJ, Inject 225 mg into the skin every 30 (thirty) days., Disp: 4.5 mL, Rfl: 4   ipratropium (ATROVENT) 0.06 % nasal spray, Place 2 sprays into both nostrils 3 (three) times daily., Disp: 15 mL, Rfl: 5    ondansetron (ZOFRAN-ODT) 4 MG disintegrating tablet, Take 1-2 tablets (4-8 mg total) by mouth every 8 (eight) hours as needed for nausea., Disp: 30 tablet, Rfl: 11   rizatriptan (MAXALT-MLT) 10 MG disintegrating tablet, Take 1 tablet (10 mg total) by mouth as needed for migraine. May repeat in 2 hours if needed. Heather Chambers 2 tablets in 24 hours., Disp: 27 tablet, Rfl: 2   SYNTHROID 75 MCG tablet, Take 1 tablet (75 mcg total) by mouth daily. On an empty stomach, Disp: 90 tablet, Rfl: 3   valACYclovir (VALTREX) 500 MG tablet, TAKE 1 TABLET TWICE A DAY AS NEEDED, Disp: 30 tablet, Rfl: 11  Allergies  Allergen Reactions   Augmentin [Amoxicillin-Pot Clavulanate] Nausea And Vomiting   Cefdinir Hives   Etodolac Other (See Comments)    "LODINE"   Phenergan [Promethazine Hcl]     Restlessness, twitching   Review of Systems Objective:  There were no vitals filed for this visit.  General: Well developed, nourished, in no acute distress, alert and oriented x3   Dermatological: Skin is warm, dry and supple bilateral. Nails x 10 are well maintained; remaining integument appears unremarkable at this time. There are no open sores, no preulcerative lesions, no rash or signs of infection present.  Vascular: Dorsalis Pedis artery and Posterior Tibial artery pedal pulses are 2/4 bilateral with immedate capillary fill time. Pedal hair growth present. No  varicosities and no lower extremity edema present bilateral.   Neruologic: Grossly intact via light touch bilateral. Vibratory intact via tuning fork bilateral. Protective threshold with Semmes Wienstein monofilament intact to all pedal sites bilateral. Patellar and Achilles deep tendon reflexes 2+ bilateral. No Babinski or clonus noted bilateral.   Musculoskeletal: No gross boney pedal deformities bilateral. No pain, crepitus, or limitation noted with foot and ankle range of motion bilateral. Muscular strength 5/5 in all groups tested bilateral.  She has pain on  palpation and end range of motion of the second third and fourth metatarsal phalangeal joints of the right foot.  She still has good range of motion of the first metatarsal phalangeal joint of the right foot with the [facility] implant present.  Gait: Unassisted, Nonantalgic.    Radiographs:  Patient refused  Assessment & Plan:   Assessment: Capsulitis lesser metatarsal heads primarily second and third right foot  Plan: Unguinal request that she have a pair of orthotics made.  We will schedule her with Heather Chambers.  I feel that a thin orthotic with a cut out subtwo and 3 and a metatarsal pad behind the heads of the metatarsal would benefit her greatly.  She would like to have them as thin as possible so they will fit into other shoes.     Heather Chambers T. [place], [place]

## 2022-11-04 ENCOUNTER — Ambulatory Visit (INDEPENDENT_AMBULATORY_CARE_PROVIDER_SITE_OTHER): Payer: Medicare Other

## 2022-11-04 DIAGNOSIS — M778 Other enthesopathies, not elsewhere classified: Secondary | ICD-10-CM | POA: Diagnosis not present

## 2022-11-04 NOTE — Progress Notes (Signed)
Patient was seen, measured for custom molded foot orthotics.  Hallux limitus and Capsulitis / Neuroma causing severe pain   Patient will benefit from CFO's as they will help provide total contact to MLA's helping to better distribute body weight across BIL feet greater reducing plantar pressure and pain and to also encourage FF and RF alignment.  Patient was scanned items to be ordered and fit when in   Patient is aware [facility] does not cover will owe $490 Financial ppw signed  Addison Bailey CPed, CFo, CFm

## 2022-11-08 ENCOUNTER — Other Ambulatory Visit: Payer: Self-pay | Admitting: Family Medicine

## 2022-11-08 DIAGNOSIS — J3089 Other allergic rhinitis: Secondary | ICD-10-CM

## 2022-11-11 ENCOUNTER — Ambulatory Visit: Payer: PRIVATE HEALTH INSURANCE | Admitting: Neurology

## 2022-11-19 ENCOUNTER — Other Ambulatory Visit: Payer: Self-pay | Admitting: *Deleted

## 2022-11-19 DIAGNOSIS — Z79890 Hormone replacement therapy (postmenopausal): Secondary | ICD-10-CM

## 2022-11-19 MED ORDER — ESTRADIOL 10 MCG VA TABS: 1.0000 | ORAL_TABLET | VAGINAL | 0 refills | Status: DC

## 2022-11-19 MED ORDER — ESTRADIOL-NORETHINDRONE ACET 0.5-0.1 MG PO TABS
1.0000 | ORAL_TABLET | Freq: Every day | ORAL | 0 refills | Status: DC
Start: 2022-11-19 — End: 2023-02-24

## 2022-11-19 NOTE — Telephone Encounter (Signed)
Patient left message requesting refill of estradiol vag tab and cream until next AEX.    Call returned to patient. Left message to call [facility] Triage at [phone], option 4.  No Rx for vaginal cream on file, is patient requesting to change from tab to cream?

## 2022-11-19 NOTE — Telephone Encounter (Signed)
Med refill request:Patient returned call and confirmed Rx for vagifem and estradiol -norethindrone needs refill until next AEX Last AEX: 09/20/21 -BS Next AEX: 01/06/23 -BS Last MMG (if hormonal med) 05/14/22 -BiRads 1 neg Refill authorized: Please Advise?

## 2022-11-26 ENCOUNTER — Other Ambulatory Visit: Payer: Self-pay | Admitting: Family Medicine

## 2022-12-23 NOTE — Progress Notes (Deleted)
67 y.o. G1P1 Married Caucasian female here for annual exam.    PCP: Claiborne Billings, Renee A, DO   Patient's last menstrual period was 02/12/2008 (approximate).           Sexually active: Yes.    The current method of family planning is post menopausal status.    Menopausal hormone therapy:  estradiol Exercising: {yes no:314532}  {types:19826} Smoker:  no  OB History  Gravida Para Term Preterm AB Living  1 1       1   SAB IAB Ectopic Multiple Live Births               # Outcome Date GA Lbr Len/2nd Weight Sex Type Anes PTL Lv  1 Para              HEALTH MAINTENANCE:    Component Value Date/Time   DIAGPAP  03/15/2020 1205    - Negative for intraepithelial lesion or malignancy (NILM)   HPVHIGH Negative 03/15/2020 1205   ADEQPAP  03/15/2020 1205    Satisfactory for evaluation; transformation zone component ABSENT.    History of abnormal Pap or positive HPV:  yes, possible cryotherapy over 30 years ago--normal paps since.  Mammogram: 05/14/22 Breast density Cat C, BI-RADS CAT 1 neg Colonoscopy:  01/15/10 Bone Density:  11/27/21  Result  osteopenia   Immunization History  Administered Date(s) Administered   Influenza Whole 11/20/2007, 11/30/2009   Influenza,inj,Quad PF,6+ Mos 10/31/2016, 11/07/2017, 12/11/2018, 12/02/2019, 11/19/2021   Influenza-Unspecified 10/31/2014, 10/29/2015, 10/31/2016, 12/10/2020   PFIZER(Purple Top)SARS-COV-2 Vaccination 05/15/2019, 06/10/2019, 01/02/2020   Pneumococcal Conjugate-13 07/19/2015   Pneumococcal Polysaccharide-23 02/24/2009   Td 02/24/2009   Tdap 01/27/2020   Zoster Recombinant(Shingrix) 02/10/2019, 02/09/2020      reports that she has never smoked. She has never used smokeless tobacco. She reports current alcohol use of about 4.0 standard drinks of alcohol per week. She reports that she does not use drugs.  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   Injection  May 04, 2018   Chronic pelvic pain in female 1995   LLQ   Chronic rhinitis    COVID-19 08/21/2020   Health maintenance examination    HSV-1 (herpes simplex virus 1) infection    Hypothyroidism    Labral tear of shoulder, degenerative, 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   Lef   Menopausal symptoms    Migraine    Osteopenia 03/2017   T score -2.1 FRAX 7.5% / 0.7%    Past Surgical History:  Procedure Laterality Date   FOOT SURGERY Right 05/31/2016   joint replacement   PELVIC LAPAROSCOPY     DIAG LAP    Current Outpatient Medications  Medication Sig Dispense Refill   Estradiol (VAGIFEM) 10 MCG TABS vaginal tablet Place 1 tablet (10 mcg total) vaginally 2 (two) times a week. Place 1 tablet vaginally twice weekly at bedtime. 24 tablet 0   Estradiol-Norethindrone Acet 0.5-0.1 MG tablet Take 1 tablet by mouth daily. 84 tablet 0   Fremanezumab-vfrm (AJOVY) 225 MG/1.5ML SOAJ Inject 225 mg into the skin every 30 (thirty) days. 4.5 mL 4   ipratropium (ATROVENT) 0.06 % nasal spray PLACE 2 SPRAYS INTO BOTH NOSTRILS 3 (THREE) TIMES DAILY 15 mL 1   ondansetron (ZOFRAN-ODT) 4 MG disintegrating tablet Take 1-2 tablets (4-8 mg total) by mouth every 8 (eight) hours as needed for nausea. 30 tablet 11   rizatriptan (MAXALT-MLT) 10 MG disintegrating tablet Take 1  tablet (10 mg total) by mouth as needed for migraine. May repeat in 2 hours if needed. Max 2 tablets in 24 hours. 27 tablet 2   SYNTHROID 75 MCG tablet TAKE 1 TABLET DAILY ON AN EMPTY STOMACH 90 tablet 0   valACYclovir (VALTREX) 500 MG tablet TAKE 1 TABLET TWICE A DAY AS NEEDED 30 tablet 11   No current facility-administered medications for this visit.    ALLERGIES: Augmentin [amoxicillin-pot clavulanate], Cefdinir, Etodolac, and Phenergan [promethazine hcl]  Family History  Problem Relation Age of Onset   Allergic rhinitis Mother    Migraines Mother    Osteoarthritis Mother    Breast cancer Sister 59   Breast cancer Maternal Grandmother 51    Allergic rhinitis Maternal Grandmother    Heart disease Maternal Grandfather    Angioedema Neg Hx    Asthma Neg Hx    Atopy Neg Hx    Eczema Neg Hx    Immunodeficiency Neg Hx    Urticaria Neg Hx     Review of Systems  PHYSICAL EXAM:  LMP 02/12/2008 (Approximate)     General appearance: alert, cooperative and appears stated age Head: normocephalic, without obvious abnormality, atraumatic Neck: no adenopathy, supple, symmetrical, trachea midline and thyroid normal to inspection and palpation Lungs: clear to auscultation bilaterally Breasts: normal appearance, no masses or tenderness, No nipple retraction or dimpling, No nipple discharge or bleeding, No axillary adenopathy Heart: regular rate and rhythm Abdomen: soft, non-tender; no masses, no organomegaly Extremities: extremities normal, atraumatic, no cyanosis or edema Skin: skin color, texture, turgor normal. No rashes or lesions Lymph nodes: cervical, supraclavicular, and axillary nodes normal. Neurologic: grossly normal  Pelvic: External genitalia:  no lesions              No abnormal inguinal nodes palpated.              Urethra:  normal appearing urethra with no masses, tenderness or lesions              Bartholins and Skenes: normal                 Vagina: normal appearing vagina with normal color and discharge, no lesions              Cervix: no lesions              Pap taken: {yes no:314532} Bimanual Exam:  Uterus:  normal size, contour, position, consistency, mobility, non-tender              Adnexa: no mass, fullness, tenderness              Rectal exam: {yes no:314532}.  Confirms.              Anus:  normal sphincter tone, no lesions  Chaperone was present for exam:  {BSCHAPERONE:31226::"Chakita Mcgraw F, CMA"}  {LABS (Optional):23779}  ASSESSMENT: Well woman visit with gynecologic exam  ***  PLAN: Mammogram screening discussed. Self breast awareness reviewed. Pap and HRV collected:  {yes no:314532} Guidelines for  Calcium, Vitamin D, regular exercise program including cardiovascular and weight bearing exercise. Medication refills:  *** Follow up:  ***    Additional counseling given.  {yes [id number]. ***  total time was spent for this patient encounter, including preparation, face-to-face counseling with the patient, coordination of care, and documentation of the encounter in addition to doing the well woman visit with gynecologic exam.   An After Visit Summary was provided to the patient.

## 2022-12-24 ENCOUNTER — Ambulatory Visit: Payer: Medicare Other

## 2022-12-24 NOTE — Progress Notes (Signed)
Patient presents today to pick up custom molded foot orthotics, diagnosed with Capsulitis by Dr. Al Corpus.   Orthotics were dispensed and fit was satisfactory. Reviewed instructions for break-in and wear. Written instructions given to patient.  Patient will follow up as needed.  Addison Bailey CPed, CFo, CFm

## 2023-01-06 ENCOUNTER — Ambulatory Visit: Payer: PRIVATE HEALTH INSURANCE | Admitting: Obstetrics and Gynecology

## 2023-01-21 ENCOUNTER — Encounter: Payer: PRIVATE HEALTH INSURANCE | Admitting: Family Medicine

## 2023-02-13 ENCOUNTER — Ambulatory Visit: Payer: Medicare Other | Admitting: Neurology

## 2023-02-17 ENCOUNTER — Ambulatory Visit: Payer: PRIVATE HEALTH INSURANCE | Admitting: Obstetrics and Gynecology

## 2023-02-20 ENCOUNTER — Other Ambulatory Visit: Payer: Self-pay

## 2023-02-20 NOTE — Telephone Encounter (Signed)
 Med refill request: Vagifem Last AEX: 09/20/21 Next AEX: cancelled 01/06/23 and 02/17/23 appt Last MMG (if hormonal med) 05/14/22 Refill authorized: Please Advise?

## 2023-02-21 MED ORDER — ESTRADIOL 10 MCG VA TABS: 1.0000 | ORAL_TABLET | VAGINAL | 0 refills | Status: DC

## 2023-02-22 ENCOUNTER — Other Ambulatory Visit: Payer: Self-pay | Admitting: Obstetrics and Gynecology

## 2023-02-22 DIAGNOSIS — Z79890 Hormone replacement therapy (postmenopausal): Secondary | ICD-10-CM

## 2023-02-24 NOTE — Telephone Encounter (Signed)
 Med refill request: estradiol-norethin Last AEX: 09/20/21 Next AEX: 03/26/23 Last MMG (if hormonal med) 05/14/22 Refill authorized: Please Advise, #[phone], 0 RF

## 2023-02-28 ENCOUNTER — Ambulatory Visit (INDEPENDENT_AMBULATORY_CARE_PROVIDER_SITE_OTHER): Payer: Medicare Other | Admitting: Family Medicine

## 2023-02-28 ENCOUNTER — Encounter: Payer: Self-pay | Admitting: Family Medicine

## 2023-02-28 VITALS — BP 122/78 | HR 67 | Ht 67.2 in | Wt 130.0 lb

## 2023-02-28 DIAGNOSIS — Z1211 Encounter for screening for malignant neoplasm of colon: Secondary | ICD-10-CM | POA: Diagnosis not present

## 2023-02-28 DIAGNOSIS — M1990 Unspecified osteoarthritis, unspecified site: Secondary | ICD-10-CM

## 2023-02-28 DIAGNOSIS — E034 Atrophy of thyroid (acquired): Secondary | ICD-10-CM

## 2023-02-28 DIAGNOSIS — B001 Herpesviral vesicular dermatitis: Secondary | ICD-10-CM

## 2023-02-28 DIAGNOSIS — M8589 Other specified disorders of bone density and structure, multiple sites: Secondary | ICD-10-CM

## 2023-02-28 DIAGNOSIS — Z79890 Hormone replacement therapy (postmenopausal): Secondary | ICD-10-CM | POA: Diagnosis not present

## 2023-02-28 DIAGNOSIS — Z23 Encounter for immunization: Secondary | ICD-10-CM

## 2023-02-28 DIAGNOSIS — N951 Menopausal and female climacteric states: Secondary | ICD-10-CM

## 2023-02-28 DIAGNOSIS — B009 Herpesviral infection, unspecified: Secondary | ICD-10-CM

## 2023-02-28 DIAGNOSIS — E038 Other specified hypothyroidism: Secondary | ICD-10-CM

## 2023-02-28 DIAGNOSIS — M899 Disorder of bone, unspecified: Secondary | ICD-10-CM | POA: Diagnosis not present

## 2023-02-28 DIAGNOSIS — M8580 Other specified disorders of bone density and structure, unspecified site: Secondary | ICD-10-CM

## 2023-02-28 DIAGNOSIS — G43701 Chronic migraine without aura, not intractable, with status migrainosus: Secondary | ICD-10-CM

## 2023-02-28 DIAGNOSIS — Z0000 Encounter for general adult medical examination without abnormal findings: Secondary | ICD-10-CM | POA: Diagnosis not present

## 2023-02-28 DIAGNOSIS — J3089 Other allergic rhinitis: Secondary | ICD-10-CM

## 2023-02-28 LAB — CBC
HCT: 40.8 % (ref 36.0–46.0)
Hemoglobin: 13.6 g/dL (ref 12.0–15.0)
MCHC: 33.3 g/dL (ref 30.0–36.0)
MCV: 91.7 fL (ref 78.0–100.0)
Platelets: 329 10*3/uL (ref 150.0–400.0)
RBC: 4.45 Mil/uL (ref 3.87–5.11)
RDW: 14.2 % (ref 11.5–15.5)
WBC: 6.5 10*3/uL (ref 4.0–10.5)

## 2023-02-28 LAB — COMPREHENSIVE METABOLIC PANEL
ALT: 9 U/L (ref 0–35)
AST: 13 U/L (ref 0–37)
Albumin: 4.4 g/dL (ref 3.5–5.2)
Alkaline Phosphatase: 36 U/L — ABNORMAL LOW (ref 39–117)
BUN: 13 mg/dL (ref 6–23)
CO2: 28 meq/L (ref 19–32)
Calcium: 8.8 mg/dL (ref 8.4–10.5)
Chloride: 103 meq/L (ref 96–112)
Creatinine, Ser: 0.73 mg/dL (ref 0.40–1.20)
GFR: 86.3 mL/min (ref >60.00)
Glucose, Bld: 91 mg/dL (ref 70–99)
Potassium: 4.8 meq/L (ref 3.5–5.1)
Sodium: 137 meq/L (ref 135–145)
Total Bilirubin: 0.3 mg/dL (ref 0.2–1.2)
Total Protein: 6.8 g/dL (ref 6.0–8.3)

## 2023-02-28 LAB — LIPID PANEL
Cholesterol: 169 mg/dL (ref 0–200)
HDL: 66.3 mg/dL
LDL Cholesterol: 94 mg/dL (ref 0–99)
NonHDL: 102.96
Total CHOL/HDL Ratio: 3
Triglycerides: 47 mg/dL (ref 0.0–149.0)
VLDL: 9.4 mg/dL (ref 0.0–40.0)

## 2023-02-28 LAB — VITAMIN D 25 HYDROXY (VIT D DEFICIENCY, FRACTURES): VITD: 32.48 ng/mL (ref 30.00–100.00)

## 2023-02-28 LAB — T4, FREE: Free T4: 0.94 ng/dL (ref 0.60–1.60)

## 2023-02-28 LAB — TSH: TSH: 1.19 u[IU]/mL (ref 0.35–5.50)

## 2023-02-28 MED ORDER — AZELASTINE HCL 0.05 % OP SOLN: 1.0000 [drp] | Freq: Two times a day (BID) | OPHTHALMIC | 11 refills | Status: AC

## 2023-02-28 MED ORDER — IPRATROPIUM BROMIDE 0.06 % NA SOLN: 2.0000 | Freq: Three times a day (TID) | NASAL | 1 refills | Status: DC

## 2023-02-28 MED ORDER — SYNTHROID 75 MCG PO TABS: 75.0000 ug | ORAL_TABLET | Freq: Every day | ORAL | 3 refills | Status: DC

## 2023-02-28 MED ORDER — VALACYCLOVIR HCL 500 MG PO TABS: ORAL_TABLET | ORAL | 11 refills | Status: AC

## 2023-02-28 NOTE — Progress Notes (Deleted)
Patient ID: Heather Chambers, female  DOB: [birth year], 68 y.o.   MRN: [id number] Patient Care Team    Relationship Specialty Notifications Start End  Natalia Leatherwood, DO PCP - General Family Medicine  01/09/15   Harrington Challenger, NP (Inactive) Nurse Practitioner Obstetrics and Gynecology  01/18/16   Rachael Fee, MD (Inactive) Attending Physician Gastroenterology  01/18/16   Bobbitt, Heywood Iles, MD Consulting Physician Allergy and Immunology  01/22/17     No chief complaint on file.   Subjective: Heather Chambers is a 68 y.o.  Female  present for CPE  All past medical history, surgical history, allergies, family history, immunizations, medications and social history were updated in the electronic medical record today. All recent labs, ED visits and hospitalizations within the last year were reviewed.  Health maintenance:  Colonoscopy:Dr. Christella Hartigan performed last colonoscopy. cologuard 03/09/2020 which was negative. Screen again 02/2023. Mammogram: GYN, 05/2021,recent abnormal with further images ok.  FHX of breast cancer.  Cervical cancer screening: 03/2020 UTD, normal. 3-5 year f/u, has gyn.  Immunizations: UTD flu 2023, tdap UTD 2021, PNA series completed rpt > 65. Shingrix  series completed.  Infectious disease screening: HIV and Hep C completed DEXA: Osteopenia, 11/27/2021 Patient has a Dental home. Hospitalizations/ED visits: reviewed  hypothyroidism Patient reports compliance with brand Synthroid 75 mcg daily.     Hormone replacement therapy (HRT) - long term hormone replacement gyn prescribed.    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  She is using the Imitrex as needed.  She has some concerns about insurance pain medication again.   Allergic rhinitis with probable nonallergic component Managed  by allergist again.      01/18/2022    9:05 AM 01/18/2021    9:34 AM 01/27/2020    9:34 AM 02/10/2019    8:36 AM 01/23/2018    1:06 PM  Depression screen PHQ 2/9  Decreased Interest 0 0 0 0 0  Down, Depressed, Hopeless 0 0 0 0 0  PHQ - 2 Score 0 0 0 0 0       No data to display          Immunization History  Administered Date(s) Administered   Influenza Whole 11/20/2007, 11/30/2009   Influenza,inj,Quad PF,6+ Mos 10/31/2016, 11/07/2017, 12/11/2018, 12/02/2019, 11/19/2021   Influenza-Unspecified 10/31/2014, 10/29/2015, 10/31/2016, 12/10/2020   PFIZER(Purple Top)SARS-COV-2 Vaccination 05/15/2019, 06/10/2019, 01/02/2020   Pneumococcal Conjugate-13 07/19/2015   Pneumococcal Polysaccharide-23 02/24/2009   Td 02/24/2009   Tdap 01/27/2020   Zoster Recombinant(Shingrix) 02/10/2019, 02/09/2020   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   Injection May 04, 2018   Chronic pelvic pain in female 1995   LLQ   Chronic rhinitis    COVID-19 08/21/2020   Health maintenance examination    HSV-1 (herpes simplex virus 1) infection    Hypothyroidism    Labral tear of shoulder, degenerative, 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   Lef   Menopausal symptoms    Migraine    Osteopenia 03/2017   T score -2.1 FRAX 7.5% / 0.7%   Allergies  Allergen Reactions   Augmentin [Amoxicillin-Pot Clavulanate] Nausea And Vomiting   Cefdinir Hives   Etodolac Other (See Comments)    "LODINE"   Phenergan [Promethazine Hcl]  Restlessness, twitching   Past Surgical History:  Procedure Laterality Date   FOOT SURGERY Right 05/31/2016   joint replacement   PELVIC LAPAROSCOPY     DIAG LAP   Family History  Problem Relation Age of Onset   Allergic rhinitis Mother    Migraines Mother    Osteoarthritis Mother    Breast cancer Sister 7   Breast cancer Maternal Grandmother 1   Allergic rhinitis Maternal Grandmother    Heart disease  Maternal Grandfather    Angioedema Neg Hx    Asthma Neg Hx    Atopy Neg Hx    Eczema Neg Hx    Immunodeficiency Neg Hx    Urticaria Neg Hx    Social History   Social History Narrative   Heather Chambers lives with her husband. She has a grown son who recently moved to [place] for a job. 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    Wears seatbelt.    Exercises 3x a week.    Smoke alarm in the home.    Feels safe in relationships   Right handed   Drinks decaf tea and coffee    Allergies as of 02/28/2023       Reactions   Augmentin [amoxicillin-pot Clavulanate] Nausea And Vomiting   Cefdinir Hives   Etodolac Other (See Comments)   "LODINE"   Phenergan [promethazine Hcl]    Restlessness, twitching        Medication List        Accurate as of February 28, 2023  9:07 AM. If you have any questions, ask your nurse or doctor.          Ajovy 225 MG/1.5ML Soaj Generic drug: Fremanezumab-vfrm Inject 225 mg into the skin every 30 (thirty) days.   Estradiol 10 MCG Tabs vaginal tablet Commonly known as: Vagifem Place 1 tablet (10 mcg total) vaginally 2 (two) times a week. Place 1 tablet vaginally twice weekly at bedtime.   Estradiol-Norethindrone Acet 0.5-0.1 MG tablet TAKE 1 TABLET BY MOUTH EVERY DAY   ipratropium 0.06 % nasal spray Commonly known as: ATROVENT PLACE 2 SPRAYS INTO BOTH NOSTRILS 3 (THREE) TIMES DAILY   ondansetron 4 MG disintegrating tablet Commonly known as: ZOFRAN-ODT Take 1-2 tablets (4-8 mg total) by mouth every 8 (eight) hours as needed for nausea.   rizatriptan 10 MG disintegrating tablet Commonly known as: MAXALT-MLT Take 1 tablet (10 mg total) by mouth as needed for migraine. May repeat in 2 hours if needed. Max 2 tablets in 24 hours.   Synthroid 75 MCG tablet Generic drug: levothyroxine TAKE 1 TABLET DAILY ON AN EMPTY STOMACH   valACYclovir 500 MG tablet Commonly known as: VALTREX TAKE 1 TABLET TWICE  A DAY AS NEEDED        All past medical history, surgical history, allergies, family history, immunizations andmedications were updated in the EMR today and reviewed under the history and medication portions of their EMR.      ROS 14 pt review of systems performed and negative (unless mentioned in an HPI)  Objective: LMP 02/12/2008 (Approximate)  Physical Exam Constitutional:      General: She is not in acute distress.    Appearance: Normal appearance. She is not ill-appearing or toxic-appearing.  HENT:     Head: Normocephalic and atraumatic.     Right Ear: Tympanic membrane, ear canal and external ear normal. There is no impacted cerumen.     Left Ear: Tympanic  membrane, ear canal and external ear normal. There is no impacted cerumen.     Nose: No congestion or rhinorrhea.     Mouth/Throat:     Mouth: Mucous membranes are moist.     Pharynx: Oropharynx is clear. No oropharyngeal exudate or posterior oropharyngeal erythema.  Eyes:     General: No scleral icterus.       Right eye: No discharge.        Left eye: No discharge.     Extraocular Movements: Extraocular movements intact.     Conjunctiva/sclera: Conjunctivae normal.     Pupils: Pupils are equal, round, and reactive to light.  Cardiovascular:     Rate and Rhythm: Normal rate and regular rhythm.     Pulses: Normal pulses.     Heart sounds: Normal heart sounds. No murmur heard.    No friction rub. No gallop.  Pulmonary:     Effort: Pulmonary effort is normal. No respiratory distress.     Breath sounds: Normal breath sounds. No stridor. No wheezing, rhonchi or rales.  Chest:     Chest wall: No tenderness.  Abdominal:     General: Abdomen is flat. Bowel sounds are normal. There is no distension.     Palpations: Abdomen is soft. There is no mass.     Tenderness: There is no abdominal tenderness. There is no right CVA tenderness, left CVA tenderness, guarding or rebound.     Hernia: No hernia is present.   Musculoskeletal:        General: No swelling, tenderness or deformity. Normal range of motion.     Cervical back: Normal range of motion and neck supple. No rigidity or tenderness.     Right lower leg: No edema.     Left lower leg: No edema.  Lymphadenopathy:     Cervical: No cervical adenopathy.  Skin:    General: Skin is warm and dry.     Coloration: Skin is not jaundiced or pale.     Findings: No bruising, erythema, lesion or rash.  Neurological:     General: No focal deficit present.     Mental Status: She is alert and oriented to person, place, and time. Mental status is at baseline.     Cranial Nerves: No cranial nerve deficit.     Sensory: No sensory deficit.     Motor: No weakness.     Coordination: Coordination normal.     Gait: Gait normal.     Deep Tendon Reflexes: Reflexes normal.  Psychiatric:        Mood and Affect: Mood normal.        Behavior: Behavior normal.        Thought Content: Thought content normal.        Judgment: Judgment normal.      No results found.  Assessment/plan: TANJALA SINAGRA is a 68 y.o. female present for CPE/cmc hypothyroidism - she requires name brand only Synthroid> [facility] Refills will be completed after results received.  TSH, T59f   Hormone replacement therapy (HRT) - long term hormone replacement gyn prescribed.  Cbc, cmp, tsh, lipids.   HSV infection/cold sore Stable Continue valACYclovir (VALTREX) 500 MG tablet   Chronic migraine without aura with status migrainosus, not intractable She had been referred to neurology and started on Anjovy and maxalt  Allergic rhinitis with probable nonallergic component Atrovent refilled prn  Osteopenia, unspecified location DEXA UTD- completed by gyn Vit d collected today  Hormone replacement therapy (HRT) - Comprehensive  metabolic panel - CBC - Hemoglobin A1c - Lipid panel  Routine general medical examination at a health care facility Colonoscopy:Dr. Christella Hartigan performed  last colonoscopy. cologuard 03/09/2020 which was negative. Screen again 02/2023. Mammogram: GYN, 05/2021,recent abnormal with further images ok.  FHX of breast cancer.  Cervical cancer screening: 03/2020 UTD, normal. 3-5 year f/u, has gyn.  Immunizations: UTD flu 2023, tdap UTD 2021, PNA series completed rpt > 65. Shingrix  series completed.  Infectious disease screening: HIV and Hep C completed DEXA: Osteopenia, 11/27/2021 Patient was encouraged to exercise greater than 150 minutes a week. Patient was encouraged to choose a diet filled with fresh fruits and vegetables, and lean meats. AVS provided to patient today for education/recommendation on gender specific health and safety maintenance.   No follow-ups on file.  No orders of the defined types were placed in this encounter.   No orders of the defined types were placed in this encounter.   Referral Orders  No referral(s) requested today     Electronically signed by: Felix Pacini, DO [facility]

## 2023-02-28 NOTE — Progress Notes (Unsigned)
Welcome to [facility] preventative exam  Patient Care Team    Relationship Specialty Notifications Start End  Natalia Leatherwood, DO PCP - General Family Medicine  01/09/15   Harrington Challenger, NP (Inactive) Nurse Practitioner Obstetrics and Gynecology  01/18/16   Rachael Fee, MD (Inactive) Attending Physician Gastroenterology  01/18/16   Bobbitt, Heywood Iles, MD Consulting Physician Allergy and Immunology  01/22/17     Chief Complaint  Patient presents with   Medicare Wellness    WTM, needs new PT referral (expired)    History of Present Ilness: Heather Chambers, 68 y.o. , female presents today for welcome to Medicare preventative exam   Health maintenance:  Colonoscopy:Dr. Christella Hartigan performed last colonoscopy. cologuard 03/09/2020 which was negative. Screen again 02/2023.rpt ordered Mammogram: GYN, 05/14/2022, Dr. Edward Jolly. FHX of breast cancer.  Cervical cancer screening: 03/2020 UTD, normal. 3-5 year f/u, has gyn.  Immunizations: UTD flu 11/12/2022, tdap UTD 2021, PNA completed, Shingrix series completed.  Infectious disease screening: HIV and Hep C completed DEXA: Osteopenia, 11/27/2021 Patient has a Dental home. Hospitalizations/ED visits: reviewed Glaucoma screen: she is establishing with new eye doctor.   Hearing Screening   500Hz  1000Hz  2000Hz  4000Hz   Right ear 30 30 30 30   Left ear 30 30 30 30    Vision Screening   Right eye Left eye Both eyes  Without correction 20/70 20/50 20/30   With correction       Hypothyroidism Patient reports compliance with brand Synthroid 75 mcg daily.     Hormone replacement therapy (HRT) - long term hormone replacement gyn prescribed.    HSV infection/cold sore Stable. Continue  Valtrex   Allergic rhinitis with probable nonallergic component Currently prescribed Atrovent and this Optivar ophthalmic.   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   Lef   Menopausal symptoms    Migraine    Osteopenia 03/2017   T score -2.1 FRAX 7.5% / 0.7%   All allergies reviewed Allergies  Allergen Reactions   Augmentin [Amoxicillin-Pot Clavulanate] Nausea And Vomiting   Cefdinir Hives   Etodolac Other (See Comments)    "LODINE"   Phenergan [Promethazine Hcl]     Restlessness, twitching   Past Surgical History:  Procedure Laterality Date   FOOT SURGERY Right 05/31/2016   joint replacement   PELVIC LAPAROSCOPY     DIAG LAP   Family History  Problem Relation Age of Onset   Allergic rhinitis Mother    Migraines Mother    Osteoarthritis Mother    Breast cancer Sister 87   Breast cancer Maternal Grandmother 51   Allergic rhinitis Maternal Grandmother    Heart disease Maternal Grandfather    Angioedema Neg Hx    Asthma Neg Hx    Atopy Neg Hx    Eczema Neg Hx    Immunodeficiency Neg Hx    Urticaria Neg Hx    Social History   Social History Narrative   Ms. Draughon lives with her husband. She  has a grown son who recently moved to [place] for a job. 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    Wears seatbelt.    Exercises 3x a week.    Smoke alarm in the home.    Feels safe in relationships   Right handed   Drinks decaf tea and coffee    All medications verified Allergies as of 02/28/2023       Reactions   Augmentin [amoxicillin-pot Clavulanate] Nausea And Vomiting   Cefdinir Hives   Etodolac Other (See Comments)   "LODINE"   Phenergan [promethazine Hcl]    Restlessness,  twitching        Medication List        Accurate as of February 28, 2023 11:59 PM. If you have any questions, ask your nurse or doctor.          Ajovy 225 MG/1.5ML Soaj Generic drug: Fremanezumab-vfrm Inject 225 mg into the skin every 30 (thirty) days.   azelastine 0.05 % ophthalmic solution Commonly known as: OPTIVAR Place 1 drop into both eyes 2 (two) times daily. Started by: Felix Pacini   Estradiol 10 MCG Tabs vaginal tablet Commonly known as: Vagifem Place 1 tablet (10 mcg total) vaginally 2 (two) times a week. Place 1 tablet vaginally twice weekly at bedtime.   Estradiol-Norethindrone Acet 0.5-0.1 MG tablet TAKE 1 TABLET BY MOUTH EVERY DAY   ipratropium 0.06 % nasal spray Commonly known as: ATROVENT Place 2 sprays into both nostrils 3 (three) times daily.   ondansetron 4 MG disintegrating tablet Commonly known as: ZOFRAN-ODT Take 1-2 tablets (4-8 mg total) by mouth every 8 (eight) hours as needed for nausea.   rizatriptan 10 MG disintegrating tablet Commonly known as: MAXALT-MLT Take 1 tablet (10 mg total) by mouth as needed for migraine. May repeat in 2 hours if needed. Max 2 tablets in 24 hours.   Synthroid 75 MCG tablet Generic drug: levothyroxine Take 1 tablet (75 mcg total) by mouth daily. on an empty stomach   valACYclovir 500 MG tablet Commonly known as: VALTREX TAKE 1 TABLET TWICE A DAY AS NEEDED        Depression Screen    02/28/2023    9:51 AM 01/18/2022    9:05 AM 01/18/2021    9:34 AM 01/27/2020    9:34 AM 02/10/2019    8:36 AM  Depression screen PHQ 2/9  Decreased Interest 1 0 0 0 0  Down, Depressed, Hopeless 1 0 0 0 0  PHQ - 2 Score 2 0 0 0 0  Altered sleeping 1      Tired, decreased energy 1      Change in appetite 0      Feeling bad or failure about yourself  0      Trouble concentrating 0      Moving slowly or fidgety/restless 0      Suicidal thoughts 0      PHQ-9 Score 4      Difficult doing work/chores Not difficult at  all          02/28/2023    9:51 AM  GAD 7 : Generalized Anxiety Score  Nervous, Anxious, on Edge 1  Control/stop worrying 1  Worry too much - different things 0  Trouble relaxing 1  Restless 0  Easily annoyed or irritable 1  Afraid - awful might happen 0  Total GAD 7 Score 4  Anxiety Difficulty Not difficult at all  Immunizations: Immunization History  Administered Date(s) Administered   Influenza Whole 11/20/2007, 11/30/2009   Influenza,inj,Quad PF,6+ Mos 10/31/2016, 11/07/2017, 12/11/2018, 12/02/2019, 11/19/2021   Influenza-Unspecified 10/31/2014, 10/29/2015, 10/31/2016, 12/10/2020, 11/12/2022   PFIZER(Purple Top)SARS-COV-2 Vaccination 05/15/2019, 06/10/2019, 01/02/2020   PNEUMOCOCCAL CONJUGATE-20 11/12/2022   Pneumococcal Conjugate-13 07/19/2015   Pneumococcal Polysaccharide-23 02/24/2009   Td 02/24/2009   Tdap 01/27/2020   Zoster Recombinant(Shingrix) 02/10/2019, 02/09/2020    Cognitive Function        02/28/2023    9:21 AM  6CIT Screen  What Year? 0 points  What month? 0 points  What time? 0 points  Count back from 20 0 points  Months in reverse 0 points  Repeat phrase 0 points  Total Score 0 points    Exercise: Routine exercise      Diet: Regular  Functional Status Survey: Get up and go test: steady and less than 20 seconds.  Is the patient deaf or have difficulty hearing?: No Does the patient have difficulty seeing, even when wearing glasses/contacts?: No Does the patient have difficulty concentrating, remembering, or making decisions?: No Does the patient have difficulty walking or climbing stairs?: No Does the patient have difficulty dressing or bathing?: No Does the patient have difficulty doing errands alone such as visiting a doctor's office or shopping?: No        02/28/2023    9:50 AM  Fall Risk   Falls in the past year? 0  Number falls in past yr: 0  Injury with Fall? 0  Risk for fall due to : No Fall Risks  Follow up Falls  evaluation completed   Physical Activity: Insufficiently Active (02/28/2023)   Exercise Vital Sign    Days of Exercise per Week: 3 days    Minutes of Exercise per Session: 40 min    Social Connections: Unknown (02/28/2023)   Social Connection and Isolation Panel [NHANES]    Frequency of Communication with Friends and Family: Three times a week    Frequency of Social Gatherings with Friends and Family: More than three times a week    Attends Religious Services: Patient declined    Active Member of Clubs or Organizations: No    Attends Banker Meetings: Not on file    Marital Status: Married   Social Drivers of Health with Concerns   Physical Activity: Insufficiently Active (02/28/2023)   Exercise Vital Sign    Days of Exercise per Week: 3 days    Minutes of Exercise per Session: 40 min  Stress: Stress Concern Present (02/28/2023)   [employer] of Occupational Health - Occupational Stress Questionnaire    Feeling of Stress : To some extent  Social Connections: Unknown (02/28/2023)   Social Connection and Isolation Panel [NHANES]    Frequency of Communication with Friends and Family: Three times a week    Frequency of Social Gatherings with Friends and Family: More than three times a week    Attends Religious Services: Patient declined    Active Member of Clubs or Organizations: No    Attends Banker Meetings: Not on file    Marital Status: Married   Tobacco Use: Low Risk  (02/28/2023)   Patient History    Smoking Tobacco Use: Never    Smokeless Tobacco Use: Never    Passive Exposure: Not on file   @alcohol @ Flowsheet Row Office Visit from 02/28/2023 in [facility]  AUDIT-C Score 2       Advanced Directives: has an  advanced directive - a copy HAS NOT been provided.  ROS  Objective   BP 122/78   Pulse 67   Ht 5' 7.2" (1.707 m)   Wt 130 lb (59 kg)   LMP 02/12/2008 (Approximate)   BMI 20.24 kg/m  Physical  Exam Vitals and nursing note reviewed.  Constitutional:      General: She is not in acute distress.    Appearance: Normal appearance. She is not ill-appearing or toxic-appearing.  HENT:     Head: Normocephalic and atraumatic.     Right Ear: Tympanic membrane, ear canal and external ear normal. There is no impacted cerumen.     Left Ear: Tympanic membrane, ear canal and external ear normal. There is no impacted cerumen.     Nose: No congestion or rhinorrhea.     Mouth/Throat:     Mouth: Mucous membranes are moist.     Pharynx: Oropharynx is clear. No oropharyngeal exudate or posterior oropharyngeal erythema.  Eyes:     General: No scleral icterus.       Right eye: No discharge.        Left eye: No discharge.     Extraocular Movements: Extraocular movements intact.     Conjunctiva/sclera: Conjunctivae normal.     Pupils: Pupils are equal, round, and reactive to light.  Cardiovascular:     Rate and Rhythm: Normal rate and regular rhythm.     Pulses: Normal pulses.     Heart sounds: Normal heart sounds. No murmur heard.    No friction rub. No gallop.  Pulmonary:     Effort: Pulmonary effort is normal. No respiratory distress.     Breath sounds: Normal breath sounds. No stridor. No wheezing, rhonchi or rales.  Chest:     Chest wall: No tenderness.  Abdominal:     General: Abdomen is flat. Bowel sounds are normal. There is no distension.     Palpations: Abdomen is soft. There is no mass.     Tenderness: There is no abdominal tenderness. There is no right CVA tenderness, left CVA tenderness, guarding or rebound.     Hernia: No hernia is present.  Musculoskeletal:        General: No swelling, tenderness or deformity. Normal range of motion.     Cervical back: Normal range of motion and neck supple. No rigidity or tenderness.     Right lower leg: No edema.     Left lower leg: No edema.  Lymphadenopathy:     Cervical: No cervical adenopathy.  Skin:    General: Skin is warm and dry.      Coloration: Skin is not jaundiced or pale.     Findings: No bruising, erythema, lesion or rash.  Neurological:     General: No focal deficit present.     Mental Status: She is alert and oriented to person, place, and time. Mental status is at baseline.     Cranial Nerves: No cranial nerve deficit.     Sensory: No sensory deficit.     Motor: No weakness.     Coordination: Coordination normal.     Gait: Gait normal.     Deep Tendon Reflexes: Reflexes normal.  Psychiatric:        Mood and Affect: Mood normal.        Behavior: Behavior normal.        Thought Content: Thought content normal.        Judgment: Judgment normal.     Hearing Screening  500Hz  1000Hz  2000Hz  4000Hz   Right ear 30 30 30 30   Left ear 30 30 30 30    Vision Screening   Right eye Left eye Both eyes  Without correction 20/70 20/50 20/30   With correction       Assessment and Plan: Heather Chambers is a 68 y.o. patient present today for their welcome to Medicare preventative visit and combination chronic medical condition follow-up visit Welcome to Medicare preventative visit Patient was encouraged to exercise greater than 150 minutes a week. Patient was encouraged to choose a diet filled with fresh fruits and vegetables, and lean meats. AVS provided to patient today for education/recommendation on gender specific health and safety maintenance. Colonoscopy:Dr. Christella Hartigan performed last colonoscopy. cologuard 03/09/2020 which was negative. Screen again 02/2023.rpt ordered Mammogram: GYN, 05/14/2022, Dr. Edward Jolly. FHX of breast cancer.  Cervical cancer screening: 03/2020 UTD, normal. 3-5 year f/u, has gyn.  Immunizations: UTD flu 11/12/2022, tdap UTD 2021, PNA completed, Shingrix series completed.  Infectious disease screening: HIV and Hep C completed DEXA: Osteopenia, 11/27/2021  Hypothyroidism due to acquired atrophy of thyroid Has been stable.  Labs collected today. Synthroid brain required-continue Synthroid 75 mcg daily.  refills will be provided in appropriate dose based on lab result today - CBC - Comp Met (CMET) - TSH - Lipid panel - Vitamin D (25 hydroxy) - T4, free  Hormone replacement therapy (HRT) - CBC - Comp Met (CMET) - TSH - Lipid panel -Managed by gynecology  Colon cancer screening - Cologuard  Allergic rhinitis with probable nonallergic component Stable -Continue ipratropium (ATROVENT) 0.06 % nasal spray; Place 2 sprays into both nostrils 3 (three) times daily.  Dispense: 15 mL; Refill: 1 -Continue Optivar as needed HSV infection Stable -Continue valACYclovir (VALTREX) 500 MG tablet; TAKE 1 TABLET TWICE A DAY AS NEEDED  Dispense: 30 tablet; Refill: 11     Alcohol abuse screening: completed if indicated STIs screening: Completed if indicated Tobacco Use: Low Risk  (02/28/2023)   Patient History    Smoking Tobacco Use: Never    Smokeless Tobacco Use: Never    Passive Exposure: Not on file    Goals      Patient Stated     Focus on self care and health        Goals Addressed             This Visit's Progress    Patient Stated       Focus on self care and health        I have personally reviewed and noted the following in the patient's chart:   Medical and social history Use of alcohol, tobacco or illicit drugs  Current medications and supplements Functional ability and status Nutritional status Physical activity Advanced directives List of other physicians Hospitalizations, surgeries, and ER visits in previous 12 months Vitals Screenings to include cognitive, depression, and falls Referrals and appointments  In addition, I have reviewed and discussed with patient certain preventive protocols, quality metrics, and best practice recommendations. A written personalized care plan for preventive services as well as general preventive health recommendations were provided to patient.     Electronically Signed by: Felix Pacini, DO [facility]-  OR

## 2023-02-28 NOTE — Patient Instructions (Signed)
  Heather Chambers , Thank you for taking time to come for your Medicare Wellness Visit. I appreciate your ongoing commitment to your health goals. Please review the following plan we discussed and let me know if I can assist you in the future.   These are the goals we discussed:  Goals      Patient Stated     Focus on self care and health        This is a list of the screening recommended for you and due dates:  Health Maintenance  Topic Date Due   Cologuard (Stool DNA test)  03/10/2023   Medicare Annual Wellness Visit  02/28/2024   Mammogram  05/13/2024   DEXA scan (bone density measurement)  11/27/2024   Pap with HPV screening  03/15/2025   DTaP/Tdap/Td vaccine (3 - Td or Tdap) 01/26/2030   Pneumonia Vaccine  Completed   Flu Shot  Completed   Hepatitis C Screening  Completed   HIV Screening  Completed   Zoster (Shingles) Vaccine  Completed   HPV Vaccine  Aged Out   Colon Cancer Screening  Discontinued   COVID-19 Vaccine  Discontinued

## 2023-03-04 ENCOUNTER — Encounter: Payer: Self-pay | Admitting: Family Medicine

## 2023-03-04 ENCOUNTER — Other Ambulatory Visit: Payer: Self-pay | Admitting: Family Medicine

## 2023-03-04 MED ORDER — SYNTHROID 75 MCG PO TABS: 75.0000 ug | ORAL_TABLET | Freq: Every day | ORAL | 3 refills | Status: AC

## 2023-03-10 ENCOUNTER — Ambulatory Visit: Payer: Medicare Other

## 2023-03-10 NOTE — Progress Notes (Signed)
Patient was feeling edge of Sulcus length inserts and also feels as if orthotics are pushing her forward  I skived edges of BIL orthotics and then heated arches as they were quite rigid making them fore flexible reducing forward motion  Patient will try and call if any other issues arise  Addison Bailey CPed, CFo, CFm

## 2023-03-12 NOTE — Progress Notes (Unsigned)
68 y.o. G1P1 Married Caucasian female here for breast and pelvic exam.   She is followed for HRT, vaginal atrophy, and osteopenia.   Having urinary incontinence.  The vaginal estradiol tablets help improve her urinary incontinence.   Having constipation, insomnia (wakes up at 2:00 am), fatigue, headaches, joint pain/inflammation, dry skin.   Wants to continue with her estradiol-norethindrone.  Decreased libido.    Has consulted with [facility].    Retired in May.  Lots of family health issues.   PCP: Felix Pacini A, DO   Patient's last menstrual period was 02/12/2008 (approximate).           Sexually active: Yes.    The current method of family planning is post menopausal status.    Menopausal hormone therapy:  vagifem Exercising: No.   Smoker:  no  OB History  Gravida Para Term Preterm AB Living  1 1    1   SAB IAB Ectopic Multiple Live Births          # Outcome Date GA Lbr Len/2nd Weight Sex Type Anes PTL Lv  1 Para              HEALTH MAINTENANCE: Last 2 paps:  03/15/20 neg: HR HPV neg, 02/24/15 neg History of abnormal Pap or positive HPV:  yes, possible cryotherapy over 30 years ago--normal paps since.  Mammogram:   05/14/22 Breast Density Cat C, BI-RADS CAT 1 neg Colonoscopy:  01/15/10, cologuard ordered Bone Density:  11/27/21  Result  osteopenia   Immunization History  Administered Date(s) Administered   Influenza Whole 11/20/2007, 11/30/2009   Influenza,inj,Quad PF,6+ Mos 10/31/2016, 11/07/2017, 12/11/2018, 12/02/2019, 11/19/2021   Influenza-Unspecified 10/31/2014, 10/29/2015, 10/31/2016, 12/10/2020, 11/12/2022   PFIZER(Purple Top)SARS-COV-2 Vaccination 05/15/2019, 06/10/2019, 01/02/2020   PNEUMOCOCCAL CONJUGATE-20 11/12/2022   Pneumococcal Conjugate-13 07/19/2015   Pneumococcal Polysaccharide-23 02/24/2009   Td 02/24/2009   Tdap 01/27/2020   Zoster Recombinant(Shingrix) 02/10/2019, 02/09/2020      reports that she has never smoked. She has never used  smokeless tobacco. She reports current alcohol use of about 4.0 standard drinks of alcohol per week. She reports that she does not use drugs.  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 6 mL 11   Estradiol (VAGIFEM) 10 MCG TABS vaginal tablet Place 1 tablet (10 mcg total) vaginally 2 (two) times a week. Place 1 tablet vaginally twice weekly at bedtime. 8 tablet 0   Estradiol-Norethindrone Acet 0.5-0.1 MG tablet TAKE 1 TABLET BY MOUTH EVERY DAY 84 tablet 0   Fremanezumab-vfrm (AJOVY) 225 MG/1.5ML SOAJ Inject 225 mg into the skin every 30 (thirty) days. 1.5  mL 11   gabapentin (NEURONTIN) 100 MG capsule Cand take 100mg  twice daily as needed and up to 300mg  in the evening for symptoms 150 capsule 3   ipratropium (ATROVENT) 0.06 % nasal spray Place 2 sprays into both nostrils 3 (three) times daily. 15 mL 1   ondansetron (ZOFRAN-ODT) 4 MG disintegrating tablet Take 1-2 tablets (4-8 mg total) by mouth every 8 (eight) hours as needed for nausea. 30 tablet 11   rizatriptan (MAXALT-MLT) 10 MG disintegrating tablet Take 1 tablet (10 mg total) by mouth as needed for migraine. May repeat in 2 hours if needed. Max 2 tablets in 24 hours. 27 tablet 4   SYNTHROID 75 MCG  tablet Take 1 tablet (75 mcg total) by mouth daily. on an empty stomach 90 tablet 3   valACYclovir (VALTREX) 500 MG tablet TAKE 1 TABLET TWICE A DAY AS NEEDED 30 tablet 11   No current facility-administered medications for this visit.    ALLERGIES: Augmentin [amoxicillin-pot clavulanate], Cefdinir, Etodolac, and Phenergan [promethazine hcl]  Family History  Problem Relation Age of Onset   Allergic rhinitis Mother    Migraines Mother    Osteoarthritis Mother    Breast cancer Sister 70   Breast cancer Maternal Grandmother 110   Allergic rhinitis Maternal Grandmother    Heart disease Maternal Grandfather    Angioedema Neg Hx    Asthma Neg Hx    Atopy Neg Hx    Eczema Neg Hx    Immunodeficiency Neg Hx    Urticaria Neg Hx     Review of Systems  All other systems reviewed and are negative.   PHYSICAL EXAM:  BP 118/80 (BP Location: Right Arm, Patient Position: Sitting, Cuff Size: Small)   Ht 5\' 3"  (1.6 m)   Wt 127 lb (57.6 kg)   LMP 02/12/2008 (Approximate)   BMI 22.50 kg/m     General appearance: alert, cooperative and appears stated age Head: normocephalic, without obvious abnormality, atraumatic Neck: no adenopathy, supple, symmetrical, trachea midline and thyroid normal to inspection and palpation Lungs: clear to auscultation bilaterally Breasts: normal appearance, no masses or tenderness, No nipple retraction or dimpling, No nipple discharge or bleeding, No axillary adenopathy Heart: regular rate and rhythm Abdomen: soft, non-tender; no masses, no organomegaly Extremities: extremities normal, atraumatic, no cyanosis or edema Skin: skin color, texture, turgor normal. No rashes or lesions Lymph nodes: cervical, supraclavicular, and axillary nodes normal. Neurologic: grossly normal  Pelvic: External genitalia:  no lesions              No abnormal inguinal nodes palpated.              Urethra:  normal appearing urethra with no masses, tenderness or lesions               Bartholins and Skenes: normal                 Vagina: normal appearing vagina with normal color and discharge, no lesions              Cervix: no lesions              Pap taken: No. Bimanual Exam:  Uterus:  normal size, contour, position, consistency, mobility, non-tender              Adnexa: no mass, fullness, tenderness              Rectal exam: Yes.  .  Confirms.  Anus:  normal sphincter tone, no lesions  Chaperone was present for exam:  Warren Lacy, CMA  ASSESSMENT: Encounter for breast and pelvic exam.  HRT.  Vaginal atrophy. Osteopenia.  FH breast cancer sister (postmenopausal) and maternal grandmother.  Thinks her sister had negative BRCA testing.  HSV I.  Decreased libido. Encounter for medication monitoring.   PLAN: Mammogram screening discussed. Self breast awareness reviewed. Pap and HRV collected:  No.  Due in 2027. Guidelines for Calcium, Vitamin D, regular exercise program including cardiovascular and weight bearing exercise. Discused WHI and use of HRT which can increase risk of PE, DVT, MI, stroke and breast cancer.  Medication refills:  estradiol/norethindrone 0.5/0.1 mg daily.  Switch from vagifem tablets to vaginal estradiol cream.  Instructed in use.  Testosterone therapy briefly discussed.  No Rx given.   Dexa declined.  Patient would need to sign a waiver currently to have this testing.  Labs and vaccines with PCP.  Follow up:  yearly and prn.     Additional counseling given.  Yes.  . 30 min  total time was spent for this patient encounter, including preparation, face-to-face counseling with the patient, coordination of care, and documentation of the encounter regarding HRT, vaginal atrophy, and osteopenia in addition to doing her breast and pelvic exam.

## 2023-03-18 ENCOUNTER — Telehealth: Payer: Self-pay | Admitting: Neurology

## 2023-03-18 ENCOUNTER — Telehealth (INDEPENDENT_AMBULATORY_CARE_PROVIDER_SITE_OTHER): Payer: Medicare Other | Admitting: Neurology

## 2023-03-18 ENCOUNTER — Encounter: Payer: Self-pay | Admitting: Neurology

## 2023-03-18 DIAGNOSIS — G43701 Chronic migraine without aura, not intractable, with status migrainosus: Secondary | ICD-10-CM

## 2023-03-18 DIAGNOSIS — F458 Other somatoform disorders: Secondary | ICD-10-CM | POA: Diagnosis not present

## 2023-03-18 MED ORDER — ONDANSETRON 4 MG PO TBDP: 4.0000 mg | ORAL_TABLET | Freq: Three times a day (TID) | ORAL | 11 refills | Status: AC | PRN

## 2023-03-18 MED ORDER — AJOVY 225 MG/1.5ML ~~LOC~~ SOAJ: 225.0000 mg | SUBCUTANEOUS | 11 refills | Status: AC

## 2023-03-18 MED ORDER — RIZATRIPTAN BENZOATE 10 MG PO TBDP: 10.0000 mg | ORAL_TABLET | ORAL | 4 refills | Status: AC | PRN

## 2023-03-18 MED ORDER — GABAPENTIN 100 MG PO CAPS: ORAL_CAPSULE | ORAL | 3 refills | Status: AC

## 2023-03-18 NOTE — Telephone Encounter (Signed)
Med check appointment in one year with NP prefer megan can be virtual thank you

## 2023-03-18 NOTE — Telephone Encounter (Signed)
Pt scheduled for VV with Megan for 03/23/24 at 1:45pm

## 2023-03-18 NOTE — Progress Notes (Signed)
 [facility]    Provider:  Dr Chambers Referring Provider: Catherine Charlies LABOR, DO Primary Care Physician:  Catherine Charlies LABOR, DO  CC:  Migraines  Virtual Visit via Video Note  I connected with Heather Chambers on 03/18/23 at  1:00 PM EST by a video enabled telemedicine application and verified that I am speaking with the correct person using two identifiers.  Location: Patient: home Provider: office   I discussed the limitations of evaluation and management by telemedicine and the availability of in person appointments. The patient expressed understanding and agreed to proceed.    I discussed the assessment and treatment plan with the patient. The patient was provided an opportunity to ask questions and all were answered. The patient agreed with the plan and demonstrated an understanding of the instructions.   The patient was advised to call back or seek an in-person evaluation if the symptoms worsen or if the condition fails to improve as anticipated.  I provided over 30 minutes of non-face-to-face time during this encounter.   Heather KATHEE Ines, MD  03/18/2023: She is still doing well on the Ajovy . She retired. She has been on Ajovy  successfully. She was spacing out the Ajovy  because she was having trouble getting it, she has been taking it every 2 months and the last time was 03/07/2023, At baseline she has >10 migraine days a month and > 15 headache days a month and with Ajovy  almost 90% improvement. We discussed Ajovy  or switching to Vyepti, but she has done so well on Ajovy .   She has been diagnosed with brachial plexus pruritus. Worse in the heat. We discussed this, treatment, etiology, we Can try Gabapentin .discussed can make her drosy, happens more in the evenings, has been to dermatology and other physicians. Can try gabapentin , kidney fx normal (see recent labs) can also try others (see A/P) and sunscreen in the summer.   Can see NP in one year video med check. Can  email for higher dose of gabapentin .  Failed multiple medications > 3 months, she [facility] and failed at least 3 classes: amitriptyline(side effects),verapamil(hypotension), propranolol contraindicated due to hypotension, topiramate(side effects), Aimovig  gave her constipation. Tried emgality but Ajovy  was better. Tried sumatriptan , maxalt . Gabapentin , zonisamide.   Reviewed labs:  Recent Results (from the past 2160 hours)  CBC     Status: None   Collection Time: 02/28/23 10:05 AM  Result Value Ref Range   WBC 6.5 4.0 - 10.5 K/uL   RBC 4.45 3.87 - 5.11 Mil/uL   Platelets 329.0 150.0 - 400.0 K/uL   Hemoglobin 13.6 12.0 - 15.0 g/dL   HCT 59.1 63.9 - 53.9 %   MCV 91.7 78.0 - 100.0 fl   MCHC 33.3 30.0 - 36.0 g/dL   RDW 85.7 88.4 - 84.4 %  Comp Met (CMET)     Status: Abnormal   Collection Time: 02/28/23 10:05 AM  Result Value Ref Range   Sodium 137 135 - 145 mEq/L   Potassium 4.8 3.5 - 5.1 mEq/L   Chloride 103 96 - 112 mEq/L   CO2 28 19 - 32 mEq/L   Glucose, Bld 91 70 - 99 mg/dL   BUN 13 6 - 23 mg/dL   Creatinine, Ser 9.26 0.40 - 1.20 mg/dL   Total Bilirubin 0.3 0.2 - 1.2 mg/dL   Alkaline Phosphatase 36 (L) 39 - 117 U/L   AST 13 0 - 37 U/L   ALT 9 0 - 35  U/L   Total Protein 6.8 6.0 - 8.3 g/dL   Albumin 4.4 3.5 - 5.2 g/dL   GFR 13.69 >39.99 mL/min    Comment: Calculated using the CKD-EPI Creatinine Equation (2021)   Calcium 8.8 8.4 - 10.5 mg/dL  TSH     Status: None   Collection Time: 02/28/23 10:05 AM  Result Value Ref Range   TSH 1.19 0.35 - 5.50 uIU/mL  Lipid panel     Status: None   Collection Time: 02/28/23 10:05 AM  Result Value Ref Range   Cholesterol 169 0 - 200 mg/dL    Comment: ATP III Classification       Desirable:  < 200 mg/dL               Borderline High:  200 - 239 mg/dL          High:  > = 759 mg/dL   Triglycerides 52.9 0.0 - 149.0 mg/dL    Comment: Normal:  <849 mg/dLBorderline High:  150 - 199 mg/dL   HDL 33.69  >60.99 mg/dL   VLDL 9.4 0.0 - 59.9 mg/dL   LDL Cholesterol 94 0 - 99 mg/dL   Total CHOL/HDL Ratio 3     Comment:                Men          Women1/2 Average Risk     3.4          3.3Average Risk          5.0          4.42X Average Risk          9.6          7.13X Average Risk          15.0          11.0                       NonHDL 102.96     Comment: NOTE:  Non-HDL goal should be 30 mg/dL higher than patient's LDL goal (i.e. LDL goal of < 70 mg/dL, would have non-HDL goal of < 100 mg/dL)  Vitamin D  (25 hydroxy)     Status: None   Collection Time: 02/28/23 10:05 AM  Result Value Ref Range   VITD 32.48 30.00 - 100.00 ng/mL  T4, free     Status: None   Collection Time: 02/28/23 10:05 AM  Result Value Ref Range   Free T4 0.94 0.60 - 1.60 ng/dL    Comment: Specimens from patients who are undergoing biotin therapy and /or ingesting biotin supplements may contain high levels of biotin.  The higher biotin concentration in these specimens interferes with this Free T4 assay.  Specimens that contain high levels  of biotin may cause false high results for this Free T4 assay.  Please interpret results in light of the total clinical presentation of the patient.       Patient complains of symptoms per HPI as well as the following symptoms: none . Pertinent negatives and positives per HPI. All others negative   Interval history 11/08/2021: Doing well on Ajovy , no significant headaches, she may change insurance she is going to be retiring early and if she can't get the ajovy  until she goes on medicare in a year we can always supplement with samples. Let us  know about her new insurance.   Interval history 11/02/2020: Rizatriptan  looks great. She is doing great on  Ajovy . She takes citrocel and that helps with the constipation.  She was on Aimovig  initially and could not tolerate due to the constipation but Ajovy  is at least better.  She likes the Maxalt , so much better than the Imitrex  as far side effects  ago, she takes the Ajovy  every 30 days, if she has a headache near the end when it is wearing off the Maxalt  helps tremendously, I told her she could take the Zofran  alone or with the Maxalt  or if she is having really bad migraine she can take the Maxalt  with the ondansetron  as well as an Aleve or other over-the-counter analgesic.  We discussed that we could always try Emgality, it is a faster injection (the Ajovy  does burn going in) and it may not cause as much constipation is hard to say people are different, I also talked about Nurtec and [place] is acute management, but at this time patient feels like she is doing really well we will schedule her for a follow-up in 1 year.  Interval history: Patient did great on Ajovy  but could not get it approved. Will try again. Failed multiple medications > 3 months, she brought in records from the [facility] and failed at least 3 classes: amitriptyline(side effects),verapamil(hypotension), propranolol contraindicated due to hypotension, topiramate(side effects), Aimovig  gave her constipation. Tried emgality but Ajovy  was better. Tried sumatriptan , maxalt . Gabapentin , zonisamide.   HPI:  Heather Chambers is a 68 y.o. female here as a referral from Dr. Catherine for recurrent migraines. She went to the [facility] and tried and failed multiple medications. Migraines started in college. It was so painful she thought she had a brain tumor. Mother has migraines with aura and vision loss but less so headaches. Patient has severe headaches. She went to the [facility] center over 10 years ago. For 3 years she went every 3 months, they tried multiple medications and failed multiple medications, the medication caused lots of GI problems.  Imitrex  is the only thing that works. Triggered by tension and stress in the evenings. Migraines start in the evenings and she wakes up overnight. It starts at night then it is severe in the morning. Always  towards the end of the week. Can last up to 4 days before resolving. Pain can be severe. Imitrex  makes her fuzzy. They are on the right side, also sinus issues may contribute. The migraines are pounding, throbbing with light and sound sensitivity. She has 8 migraine days a month and 15 headache days a month total. No medication.  She has had an MRI in the past and it was negative.    Reviewed notes, labs and imaging from outside physicians, which showed:  TSH 0.31 (follows with pcp), bmp, cbc, hgba1c normal  Reviewed report CT sinuses 07/2004: Clinical Data: Pain under eyes and above left eye on and off for months.   LIMITED CT OF PARANASAL SINUSES - 07/16/04:   Technique: Limited coronal CT images were obtained through the paranasal sinuses without intravenous contrast.  Findings: Imaged paranasal sinuses are clear. No bony sclerosis or thickening to suggest chronic sinusitis. Imaged intracranial contents appear normal. Imaged appearance of the orbits is normal.  IMPRESSION:   Negative study.    Provider: Sallyann Dolly  ENT CT 09/2015: reviewed report   FINDINGS: SINUSES: Mild LEFT maxillary sinus mucosal thickening along the floor. Ethmoid, sphenoid and frontal sinuses are well aerated. LEFT onodi air cell. Bilateral frontal sinus air cells. LEFT concha lamella. No abnormal  antral expansion or atresia, bony wall thickening/mucoperiosteal reaction. Nasal septum is midline.  OSTIA: RIGHT ostiomeatal unit, frontal and sphenoid ethmoidal recesses are patent. Soft tissue opacifies LEFT ostiomeatal unit.  FACIAL BONES: No acute facial fracture. No destructive bony lesions.  ORBITS: Ocular globes and orbital contents are unremarkable.  IMPRESSION: Mild LEFT maxillary sinus mucosal thickening and soft tissue effacement LEFT ostiomeatal unit.   Review of Systems: Patient complains of symptoms per HPI as well as the following symptoms: headache. Pertinent negatives and positives per  HPI. All others negative    Social History   Socioeconomic History   Marital status: Married    Spouse name: Not on file   Number of children: 1   Years of education: Not on file   Highest education level: Master's degree (e.g., MA, MS, MEng, MEd, MSW, MBA)  Occupational History    Employer: [employer]  Tobacco Use   Smoking status: Never   Smokeless tobacco: Never  Vaping Use   Vaping status: Never Used  Substance and Sexual Activity   Alcohol use: Yes    Alcohol/week: 4.0 standard drinks of alcohol    Types: 4 Cans of beer per week    Comment: wine and beer   Drug use: No   Sexual activity: Yes    Birth control/protection: Post-menopausal  Other Topics Concern   Not on file  Social History Narrative   Ms. Mermelstein lives with her husband. She has a grown son who recently moved to [place] for a job. 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    Wears seatbelt.    Exercises 3x a week.    Smoke alarm in the home.    Feels safe in relationships   Right handed   Drinks decaf tea and coffee   Social Drivers of Corporate Investment Banker Strain: Low Risk  (02/28/2023)   Overall Financial Resource Strain (CARDIA)    Difficulty of Paying Living Expenses: Not hard at all  Food Insecurity: No Food Insecurity (02/28/2023)   Hunger Vital Sign    Worried About Running Out of Food in the Last Year: Never true    Ran Out of Food in the Last Year: Never true  Transportation Needs: No Transportation Needs (02/28/2023)   PRAPARE - Administrator, Civil Service (Medical): No    Lack of Transportation (Non-Medical): No  Physical Activity: Insufficiently Active (02/28/2023)   Exercise Vital Sign    Days of Exercise per Week: 3 days    Minutes of Exercise per Session: 40 min  Stress: Stress Concern Present (02/28/2023)   [employer] of Occupational Health - Occupational Stress Questionnaire    Feeling of  Stress : To some extent  Social Connections: Unknown (02/28/2023)   Social Connection and Isolation Panel [NHANES]    Frequency of Communication with Friends and Family: Three times a week    Frequency of Social Gatherings with Friends and Family: More than three times a week    Attends Religious Services: Patient declined    Active Member of Clubs or Organizations: No    Attends Banker Meetings: Not on file    Marital Status: Married  Intimate Partner Violence: Not At Risk (02/28/2023)   Humiliation, Afraid, Rape, and Kick questionnaire    Fear of Current or Ex-Partner: No    Emotionally Abused: No    Physically Abused: No    Sexually Abused: No  Family History  Problem Relation Age of Onset   Allergic rhinitis Mother    Migraines Mother    Osteoarthritis Mother    Breast cancer Sister 66   Breast cancer Maternal Grandmother 33   Allergic rhinitis Maternal Grandmother    Heart disease Maternal Grandfather    Angioedema Neg Hx    Asthma Neg Hx    Atopy Neg Hx    Eczema Neg Hx    Immunodeficiency Neg Hx    Urticaria Neg Hx     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 1.5 mL 11   gabapentin  (NEURONTIN ) 100 MG capsule  Cand take 100mg  twice daily as needed and up to 300mg  in the evening for symptoms 150 capsule 3   azelastine  (OPTIVAR ) 0.05 % ophthalmic solution Place 1 drop into both eyes 2 (two) times daily. 6 mL 11   Estradiol  (VAGIFEM ) 10 MCG TABS vaginal tablet Place 1 tablet (10 mcg total) vaginally 2 (two) times a week. Place 1 tablet vaginally twice weekly at bedtime. 8 tablet 0   Estradiol -Norethindrone  Acet 0.5-0.1 MG tablet TAKE 1 TABLET BY MOUTH EVERY DAY 84 tablet 0   ipratropium (ATROVENT ) 0.06 % nasal spray Place 2 sprays into both nostrils 3 (three) times daily. 15 mL 1   ondansetron  (ZOFRAN -ODT) 4 MG disintegrating tablet Take 1-2 tablets (4-8 mg total) by mouth every 8 (eight) hours as needed for nausea. 30 tablet 11   rizatriptan  (MAXALT -MLT) 10 MG disintegrating tablet Take 1 tablet (10 mg total) by mouth as needed for migraine. May repeat in 2 hours if needed. Max 2 tablets in 24 hours. 27 tablet 4   SYNTHROID  75 MCG tablet Take 1 tablet (75 mcg total) by mouth daily. on an empty stomach 90 tablet 3   valACYclovir  (VALTREX ) 500 MG tablet TAKE 1 TABLET TWICE A DAY AS NEEDED 30 tablet 11   No current facility-administered medications for this visit.    Allergies as of 03/18/2023 - Review Complete 02/28/2023  Allergen Reaction Noted   Augmentin  [amoxicillin -pot clavulanate] Nausea And Vomiting 05/30/2015   Cefdinir Hives 11/20/2007   Etodolac Other (See Comments) 11/20/2007   Phenergan  [promethazine  hcl]  09/13/2012    Physical exam: Exam: Gen: NAD, conversant      CV: No palpitations or chest pain or SOB. VS: Breathing at a normal rate. Weight appears within normal limits. Not febrile. Eyes: Conjunctivae clear without exudates or hemorrhage  Neuro: Detailed Neurologic Exam  Speech:    Speech is normal; fluent and spontaneous with normal comprehension.  Cognition:    The patient is oriented to person, place, and time;  recent and remote memory intact;     language fluent;      normal attention, concentration, fund of knowledge Cranial Nerves:    The pupils are equal, round, and reactive to light. Visual fields are full Extraocular movements are intact.  The face is symmetric with normal sensation. The palate elevates in the midline. Hearing intact. Voice is normal. Shoulder shrug is normal. The tongue has normal motion without fasciculations.   Coordination: normal  Gait:    No abnormalities noted or reported  Motor Observation:   no involuntary movements noted. Tone:    Appears normal  Posture:    Posture is normal. normal erect    Strength:    Strength is anti-gravity and symmetric in the upper and lower limbs.      Sensation: intact to LT, no reports of numbness or tingling or paresthesias        Assessment/Plan:  68 year old, chronic migraines, doing great on Ajovy , no significant headaches, At baseline she has >10 migraine days a month and > 15 headache days a month and with Ajovy  almost 90% improvement. We discussed Ajovy  or switching to Vyepti, but she has done so well on Ajovy . At baseline she has >10 migraine days a month and > 15 headache days a month. We discussed Ajovy  or switching to Vyepti, but she has done so well on Ajovy  she doe snot want to switch recommended vyepti because Ajovy  is expensive but she doesn't want to risk it. She has a 2000 medication deductible but Vyepti would go through medical not prescriptions but again she wants to try Ajovy  but can consider Vyepti.   Failed multiple medications > 3 months, she brought in records from the [facility] and failed at least 3 classes: amitriptyline(side effects),verapamil(hypotension), propranolol contraindicated due to hypotension, topiramate(side effects), Aimovig  gave her constipation. Tried emgality but Ajovy  was better. Tried sumatriptan , maxalt . Gabapentin , zonisamide.   - Acute management: Rizatriptan .  Loves rizatriptan , at the end of the month when she starts feeling  the Ajovy  wear off it works well, told her she could take it with ondansetron  and Aleve or an over-the-counter analgesic if needed, also discussed Ubrelvy and Nurtec, right now she is happy the way things are   Has been diagnosed with brachial plexus pruritus can try a low dose of gabapentin , discussed watch for sedation and will send her complete info and sie effects in AVS: Topical medications include capsaicin, mild steroids, anesthetics, antihistamines, and amitriptyline/ketamine. Earlier reports stated topical capsaicin was the most commonly prescribed initial therapy. A newer study reported the oral tricyclic antidepressant amitriptyline was the most commonly prescribed medication for brachioradial pruritus, although gabapentin  may be more efficacious. [employer] of medicine:  [employer] Brachioradial Pruritus Blake A. Silver; Zachary DOROTHA Mcmurray. Chartered Loss Adjuster Information and Affiliations Last Update: October 23, 2020.  Sent telephone note for 1 year video follow up. Can see NP in one year video med check. Can email for higher dose of gabapentin .  Meds ordered this encounter  Medications   Fremanezumab -vfrm (AJOVY ) 225 MG/1.5ML SOAJ    Sig: Inject 225 mg into the skin every 30 (thirty) days.    Dispense:  1.5 mL    Refill:  11   rizatriptan  (MAXALT -MLT) 10 MG disintegrating tablet    Sig: Take 1 tablet (10 mg total) by mouth as needed for migraine. May repeat in 2 hours if needed. Max 2 tablets in 24 hours.    Dispense:  27 tablet    Refill:  4    If insurance will pay, please dispense a 90-day supply of 27 tablets.   ondansetron  (ZOFRAN -ODT) 4 MG disintegrating tablet    Sig: Take 1-2 tablets (4-8 mg total) by mouth every 8 (eight) hours as needed for nausea.    Dispense:  30 tablet    Refill:  11   gabapentin  (NEURONTIN ) 100 MG capsule    Sig: Cand take 100mg  twice daily as needed and up to 300mg  in the evening for symptoms    Dispense:  150 capsule    Refill:  3     Discussed: To prevent or relieve headaches, try the following: Cool Compress. Lie down and place a cool compress on your head.  Avoid headache triggers. If certain foods or odors seem to have triggered your migraines in the past, avoid them. A headache diary might help you identify triggers.  Include physical activity in your daily routine. Try a daily walk or other moderate aerobic exercise.  Manage stress. Find healthy ways to cope with the stressors, such as delegating tasks on your to-do list.  Practice relaxation techniques. Try deep breathing, yoga, massage and visualization.  Eat regularly. Eating regularly scheduled meals and maintaining a healthy diet might help prevent headaches. Also, drink plenty of fluids.  Follow a regular sleep schedule. Sleep deprivation might contribute to headaches Consider biofeedback. With this mind-body technique, you learn to control certain bodily functions -- such as muscle tension, heart rate and blood pressure -- to prevent headaches or reduce headache pain.    Proceed to emergency room if you experience new or worsening symptoms or symptoms do not resolve, if you have new neurologic symptoms or if headache is severe, or for any concerning symptom.   Provided education and documentation from American headache Society toolbox including articles on: chronic migraine medication overuse headache, chronic migraines, prevention of migraines, behavioral and other nonpharmacologic treatments for headache.    Heather Epp, MD  [facility] [address] [place], [place] [address]  Phone [phone] Fax [phone]

## 2023-03-18 NOTE — Patient Instructions (Addendum)
 Continue Ajovy  May consider Vyepti Has been diagnosed with brachial plexus pruritus can try a low dose of gabapentin :  Gabapentin  Capsules or Tablets What is this medication? GABAPENTIN  (GA ba pen tin) treats nerve pain. It may also be used to prevent and control seizures in people with epilepsy. It works by calming overactive nerves in your body. This medicine may be used for other purposes; ask your health care provider or pharmacist if you have questions. COMMON BRAND NAME(S): Active-PAC with Gabapentin , Gabarone , Gralise , Neurontin  What should I tell my care team before I take this medication? They need to know if you have any of these conditions: Kidney disease Lung or breathing disease Substance use disorder Suicidal thoughts, plans, or attempt by you or a family member An unusual or allergic reaction to gabapentin , other medications, foods, dyes, or preservatives Pregnant or trying to get pregnant Breastfeeding How should I use this medication? Take this medication by mouth with a glass of water. Follow the directions on the prescription label. You can take it with or without food. If it upsets your stomach, take it with food. Take your medication at regular intervals. Do not take it more often than directed. Do not stop taking except on your care team's advice. If you are directed to break the 600 or 800 mg tablets in half as part of your dose, the extra half tablet should be used for the next dose. If you have not used the extra half tablet within 28 days, it should be thrown away. A special MedGuide will be given to you by the pharmacist with each prescription and refill. Be sure to read this information carefully each time. Talk to your care team about the use of this medication in children. While this medication may be prescribed for children as young as 3 years for selected conditions, precautions do apply. Overdosage: If you think you have taken too much of this medicine contact  a poison control center or emergency room at once. NOTE: This medicine is only for you. Do not share this medicine with others. What if I miss a dose? If you miss a dose, take it as soon as you can. If it is almost time for your next dose, take only that dose. Do not take double or extra doses. What may interact with this medication? Alcohol Antihistamines for allergy , cough, and cold Certain medications for anxiety or sleep Certain medications for depression like amitriptyline, fluoxetine, sertraline Certain medications for seizures like phenobarbital, primidone Certain medications for stomach problems General anesthetics like halothane, isoflurane, methoxyflurane, propofol Local anesthetics like lidocaine , pramoxine, tetracaine Medications that relax muscles for surgery Opioid medications for pain Phenothiazines like chlorpromazine, mesoridazine, prochlorperazine, thioridazine This list may not describe all possible interactions. Give your health care provider a list of all the medicines, herbs, non-prescription drugs, or dietary supplements you use. Also tell them if you smoke, drink alcohol, or use illegal drugs. Some items may interact with your medicine. What should I watch for while using this medication? Visit your care team for regular checks on your progress. You may want to keep a record at home of how you feel your condition is responding to treatment. You may want to share this information with your care team at each visit. You should contact your care team if your seizures get worse or if you have any new types of seizures. Do not stop taking this medication or any of your seizure medications unless instructed by your care team. Stopping your medication  suddenly can increase your seizures or their severity. This medication may cause serious skin reactions. They can happen weeks to months after starting the medication. Contact your care team right away if you notice fevers or flu-like  symptoms with a rash. The rash may be red or purple and then turn into blisters or peeling of the skin. Or, you might notice a red rash with swelling of the face, lips or lymph nodes in your neck or under your arms. Wear a medical identification bracelet or chain if you are taking this medication for seizures. Carry a card that lists all your medications. This medication may affect your coordination, reaction time, or judgment. Do not drive or operate machinery until you know how this medication affects you. Sit up or stand slowly to reduce the risk of dizzy or fainting spells. Drinking alcohol with this medication can increase the risk of these side effects. Your mouth may get dry. Chewing sugarless gum or sucking hard candy, and drinking plenty of water may help. Watch for new or worsening thoughts of suicide or depression. This includes sudden changes in mood, behaviors, or thoughts. These changes can happen at any time but are more common in the beginning of treatment or after a change in dose. Call your care team right away if you experience these thoughts or worsening depression. If you become pregnant while using this medication, you may enroll in the [place] Antiepileptic Drug Pregnancy Registry by calling 7861486801. This registry collects information about the safety of antiepileptic medication use during pregnancy. What side effects may I notice from receiving this medication? Side effects that you should report to your care team as soon as possible: Allergic reactions or angioedema--skin rash, itching, hives, swelling of the face, eyes, lips, tongue, arms, or legs, trouble swallowing or breathing Rash, fever, and swollen lymph nodes Thoughts of suicide or self harm, worsening mood, feelings of depression Trouble breathing Unusual changes in mood or behavior in children after use such as difficulty concentrating, hostility, or restlessness Side effects that usually do not require  medical attention (report to your care team if they continue or are bothersome): Dizziness Drowsiness Nausea Swelling of ankles, feet, or hands Vomiting This list may not describe all possible side effects. Call your doctor for medical advice about side effects. You may report side effects to FDA at 1-800-FDA-1088. Where should I keep my medication? Keep out of reach of children and pets. Store at room temperature between 15 and 30 degrees C (59 and 86 degrees F). Get rid of any unused medication after the expiration date. This medication may cause accidental overdose and death if taken by other adults, children, or pets. To get rid of medications that are no longer needed or have expired: Take the medication to a medication take-back program. Check with your pharmacy or law enforcement to find a location. If you cannot return the medication, check the label or package insert to see if the medication should be thrown out in the garbage or flushed down the toilet. If you are not sure, ask your care team. If it is safe to put it in the trash, empty the medication out of the container. Mix the medication with cat litter, dirt, coffee grounds, or other unwanted substance. Seal the mixture in a bag or container. Put it in the trash. NOTE: This sheet is a summary. It may not cover all possible information. If you have questions about this medicine, talk to your doctor, pharmacist, or health care  provider.  2024 Elsevier/Gold Standard (2021-11-13 00:00:00)Eptinezumab Injection What is this medication? EPTINEZUMAB (EP ti NEZ ue mab) prevents migraines. It works by blocking a substance in the body that causes migraines. It is a monoclonal antibody. This medicine may be used for other purposes; ask your health care provider or pharmacist if you have questions. COMMON BRAND NAME(S): Vyepti What should I tell my care team before I take this medication? They need to know if you have any of these  conditions: An unusual or allergic reaction to eptinezumab, other medications, foods, dyes, or preservatives Pregnant or trying to get pregnant Breast-feeding How should I use this medication? This medication is injected into a vein. It is given by your care team in a hospital or clinic setting. Talk to your care team about the use of this medication in children. Special care may be needed. Overdosage: If you think you have taken too much of this medicine contact a poison control center or emergency room at once. NOTE: This medicine is only for you. Do not share this medicine with others. What if I miss a dose? Keep appointments for follow-up doses. It is important not to miss your dose. Call your care team if you are unable to keep an appointment. What may interact with this medication? Interactions are not expected. This list may not describe all possible interactions. Give your health care provider a list of all the medicines, herbs, non-prescription drugs, or dietary supplements you use. Also tell them if you smoke, drink alcohol, or use illegal drugs. Some items may interact with your medicine. What should I watch for while using this medication? Your condition will be monitored carefully while you are receiving this medication. What side effects may I notice from receiving this medication? Side effects that you should report to your care team as soon as possible: Allergic reactions or angioedema--skin rash, itching or hives, swelling of the face, eyes, lips, tongue, arms, or legs, trouble swallowing or breathing Side effects that usually do not require medical attention (report to your care team if they continue or are bothersome): Runny or stuffy nose This list may not describe all possible side effects. Call your doctor for medical advice about side effects. You may report side effects to FDA at 1-800-FDA-1088. Where should I keep my medication? This medication is given in a hospital  or clinic. It will not be stored at home. NOTE: This sheet is a summary. It may not cover all possible information. If you have questions about this medicine, talk to your doctor, pharmacist, or health care provider.  2024 Elsevier/Gold Standard (2021-03-26 00:00:00)Fremanezumab  Injection What is this medication? FREMANEZUMAB  (fre ma NEZ ue mab) prevents migraines. It works by blocking a substance in the body that causes migraines. It is a monoclonal antibody. This medicine may be used for other purposes; ask your health care provider or pharmacist if you have questions. COMMON BRAND NAME(S): AJOVY  What should I tell my care team before I take this medication? They need to know if you have any of these conditions: An unusual or allergic reaction to fremanezumab , other medications, foods, dyes, or preservatives Pregnant or trying to get pregnant Breast-feeding How should I use this medication? This medication is injected under the skin. You will be taught how to prepare and give it. Take it as directed on the prescription label. Keep taking it unless your care team tells you to stop. It is important that you put your used needles and syringes in a special sharps  container. Do not put them in a trash can. If you do not have a sharps container, call your pharmacist or care team to get one. Talk to your care team about the use of this medication in children. Special care may be needed. Overdosage: If you think you have taken too much of this medicine contact a poison control center or emergency room at once. NOTE: This medicine is only for you. Do not share this medicine with others. What if I miss a dose? If you miss a dose, take it as soon as you can. If it is almost time for your next dose, take only that dose. Do not take double or extra doses. What may interact with this medication? Interactions are not expected. This list may not describe all possible interactions. Give your health care  provider a list of all the medicines, herbs, non-prescription drugs, or dietary supplements you use. Also tell them if you smoke, drink alcohol, or use illegal drugs. Some items may interact with your medicine. What should I watch for while using this medication? Tell your care team if your symptoms do not start to get better or if they get worse. What side effects may I notice from receiving this medication? Side effects that you should report to your care team as soon as possible: Allergic reactions or angioedema--skin rash, itching or hives, swelling of the face, eyes, lips, tongue, arms, or legs, trouble swallowing or breathing Side effects that usually do not require medical attention (report to your care team if they continue or are bothersome): Pain, redness, or irritation at injection site This list may not describe all possible side effects. Call your doctor for medical advice about side effects. You may report side effects to FDA at 1-800-FDA-1088. Where should I keep my medication? Keep out of the reach of children and pets. Store in a refrigerator or at room temperature between 20 and 25 degrees C (68 and 77 degrees F). Refrigeration (preferred): Store in the refrigerator. Do not freeze. Keep in the original container until you are ready to take it. Remove the dose from the carton about 30 minutes before it is time for you to use it. If the dose is not used, it may be stored in the original container at room temperature for 7 days. Get rid of any unused medication after the expiration date. Room Temperature: This medication may be stored at room temperature for up to 7 days. Keep it in the original container. Protect from light until time of use. If it is stored at room temperature, get rid of any unused medication after 7 days or after it expires, whichever is first. To get rid of medications that are no longer needed or have expired: Take the medication to a medication take-back program.  Check with your pharmacy or law enforcement to find a location. If you cannot return the medication, ask your pharmacist or care team how to get rid of this medication safely. NOTE: This sheet is a summary. It may not cover all possible information. If you have questions about this medicine, talk to your doctor, pharmacist, or health care provider.  2024 Elsevier/Gold Standard (2021-03-23 00:00:00)

## 2023-03-20 ENCOUNTER — Other Ambulatory Visit (HOSPITAL_COMMUNITY): Payer: Self-pay

## 2023-03-26 ENCOUNTER — Telehealth: Payer: Self-pay | Admitting: *Deleted

## 2023-03-26 ENCOUNTER — Other Ambulatory Visit (HOSPITAL_COMMUNITY): Payer: Self-pay

## 2023-03-26 ENCOUNTER — Ambulatory Visit: Payer: Medicare Other | Admitting: Obstetrics and Gynecology

## 2023-03-26 ENCOUNTER — Encounter: Payer: Self-pay | Admitting: Obstetrics and Gynecology

## 2023-03-26 VITALS — BP 118/80 | Ht 63.0 in | Wt 127.0 lb

## 2023-03-26 DIAGNOSIS — N952 Postmenopausal atrophic vaginitis: Secondary | ICD-10-CM | POA: Diagnosis not present

## 2023-03-26 DIAGNOSIS — Z79890 Hormone replacement therapy (postmenopausal): Secondary | ICD-10-CM

## 2023-03-26 DIAGNOSIS — M8580 Other specified disorders of bone density and structure, unspecified site: Secondary | ICD-10-CM

## 2023-03-26 DIAGNOSIS — Z5181 Encounter for therapeutic drug level monitoring: Secondary | ICD-10-CM

## 2023-03-26 DIAGNOSIS — Z9189 Other specified personal risk factors, not elsewhere classified: Secondary | ICD-10-CM

## 2023-03-26 DIAGNOSIS — Z01419 Encounter for gynecological examination (general) (routine) without abnormal findings: Secondary | ICD-10-CM

## 2023-03-26 DIAGNOSIS — B009 Herpesviral infection, unspecified: Secondary | ICD-10-CM

## 2023-03-26 MED ORDER — ESTRADIOL 0.1 MG/GM VA CREA: TOPICAL_CREAM | VAGINAL | 2 refills | Status: DC

## 2023-03-26 MED ORDER — ESTRADIOL-NORETHINDRONE ACET 0.5-0.1 MG PO TABS: 1.0000 | ORAL_TABLET | Freq: Every day | ORAL | 3 refills | Status: DC

## 2023-03-26 NOTE — Patient Instructions (Signed)

## 2023-03-26 NOTE — Telephone Encounter (Signed)
Received fax from [place] stating Ajovy needs a PA.

## 2023-03-27 ENCOUNTER — Other Ambulatory Visit (HOSPITAL_COMMUNITY): Payer: Self-pay

## 2023-03-27 ENCOUNTER — Telehealth: Payer: Self-pay

## 2023-03-27 ENCOUNTER — Encounter: Payer: Self-pay | Admitting: Neurology

## 2023-03-27 NOTE — Telephone Encounter (Signed)
PA request has been Submitted. New Encounter created for follow up. For additional info see Pharmacy Prior Auth telephone encounter from 02/13.

## 2023-03-27 NOTE — Telephone Encounter (Signed)
*  [facility]  Pharmacy Patient Advocate Encounter  Received notification from [employer] that Prior Authorization for AJOVY (fremanezumab-vfrm) injection 225MG /1.5ML auto-injectors  has been CANCELLED due to: Drug is covered by current benefit plan. No further PA activity needed    PA #/Case ID/Reference #: [facility]

## 2023-04-29 ENCOUNTER — Other Ambulatory Visit: Payer: Self-pay | Admitting: Obstetrics and Gynecology

## 2023-04-29 DIAGNOSIS — Z0000 Encounter for general adult medical examination without abnormal findings: Secondary | ICD-10-CM

## 2023-05-01 LAB — COLOGUARD: COLOGUARD: NEGATIVE

## 2023-05-14 ENCOUNTER — Other Ambulatory Visit: Payer: Self-pay | Admitting: Obstetrics and Gynecology

## 2023-05-14 DIAGNOSIS — Z79890 Hormone replacement therapy (postmenopausal): Secondary | ICD-10-CM

## 2023-05-14 NOTE — Telephone Encounter (Deleted)
 PremPro is not covered by her insurance.   Patient will need to give me a list of options of her formulary.

## 2023-05-14 NOTE — Telephone Encounter (Signed)
 Med refill request: Prempro 0.3-1.5 mg tablet Last AEX: 03/26/2023 BS Next AEX: 04/01/2024 BS Last MMG (if hormonal med) 05/14/2022 Refill authorized: Last Rx (estradiol-norethindrone Acet 0.5-0.1 mg tab) sent #[phone] with 3 refills on 03/26/2023. Please approve or deny as appropriate.

## 2023-05-14 NOTE — Telephone Encounter (Signed)
 Patient's current HRT is not covered by her insurance.   I received a request for PremPro 0.3/1.5 mg.  I would suggest a monthly prescription until we know if this dosage will work for her to control menopausal symptoms.  I will send this to her pharmacy.

## 2023-05-15 NOTE — Telephone Encounter (Signed)
 Spoke with patient and [facility] with patient on the line.   Patient states she does not want to change to prempro, wants to continue estradiol-norethindrone Acet 0.5-0.1 mg tab. Medication is working well for her and she has been using Good Rx coupon. Pharmacy discontinued Prempro at patients request.   Advised I will review Rx request with Dr. Edward Jolly and f/u when she returns to the office on 4/7. Patient appreciative of call.   Dr. Edward Jolly -please advise on estradiol-norethindrone Acet 0.5-0.1 mg tab

## 2023-05-17 ENCOUNTER — Other Ambulatory Visit: Payer: Self-pay | Admitting: Obstetrics and Gynecology

## 2023-05-17 MED ORDER — ESTRADIOL-NORETHINDRONE ACET 0.5-0.1 MG PO TABS: 1.0000 | ORAL_TABLET | Freq: Every day | ORAL | 3 refills | Status: DC

## 2023-05-17 NOTE — Progress Notes (Signed)
 Reordering of Activella generic HRT per patient request.  She declines Prempro.

## 2023-05-18 ENCOUNTER — Other Ambulatory Visit: Payer: Self-pay | Admitting: Obstetrics and Gynecology

## 2023-05-19 ENCOUNTER — Ambulatory Visit

## 2023-05-21 ENCOUNTER — Other Ambulatory Visit: Payer: Self-pay

## 2023-05-21 NOTE — Telephone Encounter (Signed)
 Inquiry received from pharmacy via paper fax stating that current rx for estradiol-norethindrone 0.5-0.1 tabs is not covered by pt's insurance and listed a preferred alternative.  Rx pend.

## 2023-05-29 ENCOUNTER — Ambulatory Visit
Admission: RE | Admit: 2023-05-29 | Discharge: 2023-05-29 | Disposition: A | Discharge status: Home / Self Care | Source: Ambulatory Visit | Attending: Obstetrics and Gynecology | Admitting: Obstetrics and Gynecology

## 2023-05-29 DIAGNOSIS — Z0000 Encounter for general adult medical examination without abnormal findings: Secondary | ICD-10-CM

## 2023-05-30 ENCOUNTER — Telehealth: Payer: Self-pay

## 2023-05-30 ENCOUNTER — Other Ambulatory Visit (HOSPITAL_COMMUNITY): Payer: Self-pay

## 2023-05-30 NOTE — Telephone Encounter (Signed)
 Pharmacy Patient Advocate Encounter  Received notification from [employer] that Prior Authorization for AJOVY  (fremanezumab -vfrm) injection 225MG /1.5ML auto-injectors has been APPROVED from 05/30/2023 to 05/29/2024. Ran test claim, Copay is $146.93. This test claim was processed through [facility]- copay amounts may vary at other pharmacies due to pharmacy/plan contracts, or as the patient moves through the different stages of their insurance plan.   PA #/Case ID/Reference #: PA Case ID #: [id number]

## 2023-06-03 ENCOUNTER — Encounter: Payer: Self-pay | Admitting: Obstetrics and Gynecology

## 2023-07-02 ENCOUNTER — Other Ambulatory Visit: Payer: Self-pay | Admitting: Family Medicine

## 2023-07-02 DIAGNOSIS — J3089 Other allergic rhinitis: Secondary | ICD-10-CM

## 2023-08-18 ENCOUNTER — Telehealth: Payer: Self-pay

## 2023-08-18 NOTE — Telephone Encounter (Signed)
 Please have the patient provide me a list of medications covered for hormone replacement therapy, so I can help her best.   Thank you!

## 2023-08-18 NOTE — Telephone Encounter (Signed)
 Received via fax from [facility] pharmacy: ALTERNATIVE REQUESTED  Med refill request: Estradiol -Noreth 0.5-0.1 MG Tb #[id number]   The prescribed medication is not covered by insurance. Please consider changing to one of the suggested covered alternative.  Refill authorized: Please Advise?

## 2023-10-07 ENCOUNTER — Encounter: Payer: Self-pay | Admitting: Neurology

## 2023-11-10 ENCOUNTER — Ambulatory Visit (INDEPENDENT_AMBULATORY_CARE_PROVIDER_SITE_OTHER): Admitting: Family Medicine

## 2023-11-10 VITALS — BP 124/70 | HR 74 | Temp 98.2°F | Ht 63.0 in | Wt 135.0 lb

## 2023-11-10 DIAGNOSIS — L03313 Cellulitis of chest wall: Secondary | ICD-10-CM | POA: Diagnosis not present

## 2023-11-10 MED ORDER — DOXYCYCLINE HYCLATE 100 MG PO TABS: 100.0000 mg | ORAL_TABLET | Freq: Two times a day (BID) | ORAL | 0 refills | Status: DC

## 2023-11-10 NOTE — Patient Instructions (Addendum)
 Hibiclens wash twice a day Followed by bag balm application Epson salt soaks twice a day Start doxycyline every 12 hrs. For 5 days.    Return in about 4 days (around 11/14/2023).        Great to see you today.  I have refilled the medication(s) we provide.   If labs were collected or images ordered, we will inform you of  results once we have received them and reviewed. We will contact you either by echart message, or telephone call.  Please give ample time to the testing facility, and our office to run,  receive and review results. Please do not call inquiring of results, even if you can see them in your chart. We will contact you as soon as we are able. If it has been over 1 week since the test was completed, and you have not yet heard from us , then please call us .    - echart message- for normal results that have been seen by the patient already.   - telephone call: abnormal results or if patient has not viewed results in their echart.  If a referral to a specialist was entered for you, please call us  in 2 weeks if you have not heard from the specialist office to schedule.

## 2023-11-10 NOTE — Progress Notes (Signed)
 Heather Chambers , 05/15/1955, 68 y.o., female MRN: [id number] Patient Care Team    Relationship Specialty Notifications Start End  Catherine Charlies LABOR, DO PCP - General Family Medicine  01/09/15   Teressa Toribio SQUIBB, MD (Inactive) Attending Physician Gastroenterology  01/18/16   Bobbitt, Elgin Pepper, MD Consulting Physician Allergy  and Immunology  01/22/17     Chief Complaint  Patient presents with   Wound Infection    Patient states started Saturday that she noticed the swelling and redness. Patient states its moving down her left side. She has been putting heat compression and neosporin on it      Subjective: Heather Chambers is a 68 y.o. Pt presents for an OV with complaints of possible infection of a cut on her left shoulder of 3 days duration.  Patient reports she had picked/scratched at a skin irritation on her left shoulder, then noticed on Saturday it was becoming swollen and red.  She did soak with warm compresses and was able to expel a small amount of purulent drainage.  She reports the skin below the lesion started to become red and sore. She reports the skin lesion was from being in the sun she has been getting skin lesions that are itchy whenever she goes in the sun.  She denies any bug bites or lacerations. Tdap UTD 2021. Allergic to Augmentin  and cephalosporin.  She reports she has been placing Neosporin over with a Band-Aid. She denies fevers or chills.     11/10/2023   11:38 AM 02/28/2023    9:51 AM 01/18/2022    9:05 AM 01/18/2021    9:34 AM 01/27/2020    9:34 AM  Depression screen PHQ 2/9  Decreased Interest 0 1 0 0 0  Down, Depressed, Hopeless 0 1 0 0 0  PHQ - 2 Score 0 2 0 0 0  Altered sleeping  1     Tired, decreased energy  1     Change in appetite  0     Feeling bad or failure about yourself   0     Trouble concentrating  0     Moving slowly or fidgety/restless  0     Suicidal thoughts  0     PHQ-9 Score  4     Difficult doing work/chores  Not difficult at all        Allergies  Allergen Reactions   Augmentin  [Amoxicillin -Pot Clavulanate] Nausea And Vomiting   Cefdinir Hives   Etodolac Other (See Comments)    LODINE   Phenergan  [Promethazine  Hcl]     Restlessness, twitching   Social History   Social History Narrative   Ms. Leask lives with her husband. She has a grown son who recently moved to [place] for a job. 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    Wears seatbelt.    Exercises 3x a week.    Smoke alarm in the home.    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   Lef   Menopausal symptoms    Migraine    Osteopenia 03/2017   T score -2.1 FRAX 7.5% / 0.7%   Past Surgical History:  Procedure Laterality Date   FOOT SURGERY Right 05/31/2016   joint replacement   PELVIC LAPAROSCOPY     DIAG LAP   Family History  Problem Relation Age of Onset   Allergic rhinitis Mother    Migraines Mother    Osteoarthritis Mother    Breast cancer Sister 52   Breast cancer Maternal Grandmother 30   Allergic rhinitis Maternal Grandmother    Heart disease Maternal Grandfather    Angioedema Neg Hx    Asthma Neg Hx    Atopy Neg Hx    Eczema Neg Hx    Immunodeficiency Neg Hx    Urticaria Neg Hx    Allergies as of 11/10/2023       Reactions   Augmentin  [amoxicillin -pot Clavulanate] Nausea And Vomiting   Cefdinir Hives   Etodolac Other (See Comments)   LODINE   Phenergan  [promethazine  Hcl]    Restlessness, twitching        Medication List        Accurate as of  November 10, 2023 12:01 PM. If you have any questions, ask your nurse or doctor.          Ajovy  225 MG/1.5ML Soaj Generic drug: Fremanezumab -vfrm Inject 225 mg into the skin every 30 (thirty) days.   azelastine  0.05 % ophthalmic solution Commonly known as: OPTIVAR  Place 1 drop into both eyes 2 (two) times daily.   doxycycline  100 MG tablet Commonly known as: VIBRA -TABS Take 1 tablet (100 mg total) by mouth 2 (two) times daily. Started by: Jachob Mcclean   estradiol  0.1 MG/GM vaginal cream Commonly known as: ESTRACE  Use 1/2 g vaginally every night for the first 2 weeks, then use 1/2 g vaginally two or three times per week as needed to maintain symptom relief.   Estradiol -Norethindrone  Acet 0.5-0.1 MG tablet Take 1 tablet by mouth daily.   gabapentin  100 MG capsule Commonly known as: Neurontin  Cand take 100mg  twice daily as needed and up to 300mg  in the evening for symptoms   ipratropium 0.06 % nasal spray Commonly known as: ATROVENT  PLACE 2 SPRAYS INTO BOTH NOSTRILS 3 (THREE) TIMES DAILY   ondansetron  4 MG disintegrating tablet Commonly known as: ZOFRAN -ODT Take 1-2 tablets (4-8 mg total) by mouth every 8 (eight) hours as needed for nausea.   rizatriptan  10 MG disintegrating tablet Commonly known as: MAXALT -MLT Take 1 tablet (10 mg total) by mouth as needed for migraine. May repeat in 2 hours if needed. Max 2 tablets in 24 hours.   Synthroid  75 MCG tablet Generic drug: levothyroxine  Take 1 tablet (75 mcg total) by mouth daily. on an empty stomach   valACYclovir  500 MG tablet Commonly known as: VALTREX  TAKE 1 TABLET TWICE A DAY AS NEEDED        All past medical history, surgical history, allergies, family history, immunizations andmedications were updated in the EMR today and reviewed under the history and medication portions of their EMR.     ROS Negative, with the exception of above mentioned in HPI   Objective:  BP 124/70 (BP Location: Left Arm, Patient  Position: Sitting, Cuff Size: Normal)   Pulse 74   Temp 98.2 F (36.8 C) (Oral)   Ht 5' 3 (1.6 m)   Wt 135 lb (61.2 kg)  LMP 02/12/2008 (Approximate)   SpO2 94%   BMI 23.91 kg/m  Body mass index is 23.91 kg/m.  Physical Exam Vitals and nursing note reviewed.  Constitutional:      General: She is not in acute distress.    Appearance: Normal appearance. She is normal weight. She is not ill-appearing or toxic-appearing.  HENT:     Head: Normocephalic and atraumatic.  Eyes:     General: No scleral icterus.       Right eye: No discharge.        Left eye: No discharge.     Extraocular Movements: Extraocular movements intact.     Conjunctiva/sclera: Conjunctivae normal.     Pupils: Pupils are equal, round, and reactive to light.  Skin:    Findings: No rash.     Comments: Left anterior medial shoulder with small excoriated skin lesion present.  No fluctuance.  No drainage.  No bleeding.  Redness surrounding skin lesion and inferior to skin lesion.  Neurological:     Mental Status: She is alert and oriented to person, place, and time. Mental status is at baseline.     Motor: No weakness.     Coordination: Coordination normal.     Gait: Gait normal.  Psychiatric:        Mood and Affect: Mood normal.        Behavior: Behavior normal.        Thought Content: Thought content normal.        Judgment: Judgment normal.      No results found. No results found. No results found for this or any previous visit (from the past 24 hours).  Assessment/Plan: Heather Chambers is a 68 y.o. female present for OV for  Cellulitis of chest wall (Primary) Mild cellulitis surrounding excoriated skin lesion. Hibiclens wash twice daily Epson salt warm compress soaks twice daily Followed by  ointment application Doxy twice daily x 5 days Follow-up 5 to 7 days, sooner if needed  Reviewed expectations re: course of current medical issues. Discussed self-management of symptoms. Outlined signs and  symptoms indicating need for more acute intervention. Patient verbalized understanding and all questions were answered. Patient received an After-Visit Summary.    No orders of the defined types were placed in this encounter.  Meds ordered this encounter  Medications   doxycycline  (VIBRA -TABS) 100 MG tablet    Sig: Take 1 tablet (100 mg total) by mouth 2 (two) times daily.    Dispense:  10 tablet    Refill:  0   Referral Orders  No referral(s) requested today     Note is dictated utilizing voice recognition software. Although note has been proof read prior to signing, occasional typographical errors still can be missed. If any questions arise, please do not hesitate to call for verification.   electronically signed by:  Charlies Bellini, DO  [facility] - [place]

## 2023-11-13 ENCOUNTER — Ambulatory Visit: Payer: Self-pay | Admitting: *Deleted

## 2023-11-13 NOTE — Telephone Encounter (Signed)
 FYI Only or Action Required?: Action required by provider: medication refill request and update on patient condition.  Patient was last seen in primary care on 11/10/2023 by Catherine Fuller A, DO.  Called Nurse Triage reporting Laceration.  Symptoms began several days ago.  Interventions attempted: doxycycline  (VIBRA -TABS) 100 MG tablet .  Symptoms are: somewhat better and same.  Triage Disposition: Call PCP Within 24 Hours  Patient/caregiver understands and will follow disposition?: No, wishes to speak with PCP Patient is concerned that the main area of cut/laceration is not healing as it should- redness around the cut has improved with antibiotic -but patient is concerned her last dose is tomorrow and she may need extended antibiotic. Patient can come to see PCP if needed- but really wants to continue antibiotic.  Reason for Disposition  [1] Caller has NON-URGENT question AND [2] triager unable to answer question  Answer Assessment - Initial Assessment Questions 1. SYMPTOM: What's the main symptom you're concerned about? (e.g., redness, swelling, pain, fever, weakness)     Main area of laceration it is still red and swollen, uncomfortable  2. WOUND INFECTION LOCATION: Where is the wound infection located? (e.g., arm, face, foot, knee, leg)     Left arm/shoulder 3. WOUND INFECTION SIZE: What is the size of the red area? (e.g., inches, centimeters; compare to size of a coin)      Overall red area has improved- patient is concerned about main area /cut not progressing as expected.  4. BETTER-SAME-WORSE: Are you getting better, staying the same, or getting worse compared to the day you started the antibiotics?      Better as far as spreading redness- but main cut area still same 5. PAIN: Do you have any pain?  If Yes, ask: How bad is the pain?  (e.g., Scale 1-10; mild, moderate, or severe)     3-4/10 6. FEVER: Do you have a fever? If Yes, ask: What is it, how was it measured  and when did it start?     no 7. OTHER SYMPTOMS: Do you have any other symptoms? (e.g., pus coming from a wound, red streaks, weakness)     no 8. DIAGNOSIS DATE: When was the wound infection diagnosed? By whom?      11/10/23 9. ANTIBIOTIC NAME: What antibiotic(s) are you taking?  How many times a day? Note: Be sure the patient is taking the antibiotic as directed.      doxycycline  (VIBRA -TABS) 100 MG tablet 10. ANTIBIOTIC DATE: When was the antibiotic started?       11/10/23 11. FOLLOW-UP APPOINTMENT: Do you have a follow-up appointment with your doctor?       None- last day of antibiotic tomorrow- patient wonders if she should have continuation  Protocols used: Wound Infection on Antibiotic Follow-up Call-A-AH   Copied from CRM #[id number]. Topic: Clinical - Red Word Triage >> Nov 13, 2023  2:10 PM [place] A wrote: [facility] that prompted transfer to Nurse Triage: Patient has cut on her arm that is swollen and infected. Still having pain as well. Has been seen by doctor on 11/10/23

## 2023-11-14 ENCOUNTER — Encounter: Payer: Self-pay | Admitting: Family Medicine

## 2023-11-14 MED ORDER — DOXYCYCLINE HYCLATE 100 MG PO TABS: 100.0000 mg | ORAL_TABLET | Freq: Two times a day (BID) | ORAL | 0 refills | Status: DC

## 2023-11-14 NOTE — Addendum Note (Signed)
 Addended by: Rima Blizzard A on: 11/14/2023 11:28 AM   Modules accepted: Orders

## 2023-11-14 NOTE — Telephone Encounter (Signed)
 LVM to discuss. Sent MyChart.

## 2023-11-14 NOTE — Telephone Encounter (Signed)
 Rx faxed

## 2023-11-14 NOTE — Telephone Encounter (Signed)
 Please call patient  Patient was asked to follow-up either today or Monday.  I do not see she scheduled an appointment for follow-up.  I will agree to extend the antibiotic over the weekend, but I want her to follow-up on Monday to ensure of her right track.  Doxycycline  printed-note was not converted from call center.  Please fax prescription ASAP

## 2023-11-14 NOTE — Telephone Encounter (Signed)
 Pt returned my call. Pt aware and verbalized understanding. Pt scheduled.

## 2023-11-17 ENCOUNTER — Encounter: Payer: Self-pay | Admitting: Family Medicine

## 2023-11-17 ENCOUNTER — Ambulatory Visit (INDEPENDENT_AMBULATORY_CARE_PROVIDER_SITE_OTHER): Admitting: Family Medicine

## 2023-11-17 VITALS — BP 120/76 | HR 73 | Temp 98.1°F | Wt 135.6 lb

## 2023-11-17 DIAGNOSIS — L03313 Cellulitis of chest wall: Secondary | ICD-10-CM

## 2023-11-17 DIAGNOSIS — M659 Unspecified synovitis and tenosynovitis, unspecified site: Secondary | ICD-10-CM

## 2023-11-17 MED ORDER — DOXYCYCLINE HYCLATE 100 MG PO TABS: 100.0000 mg | ORAL_TABLET | Freq: Two times a day (BID) | ORAL | 0 refills | Status: DC

## 2023-11-17 NOTE — Patient Instructions (Signed)

## 2023-11-17 NOTE — Progress Notes (Signed)
 Heather Chambers , 15-Jun-1955, 68 y.o., female MRN: [id number] Patient Care Team    Relationship Specialty Notifications Start End  Catherine Charlies LABOR, DO PCP - General Family Medicine  01/09/15   Teressa Toribio SQUIBB, MD (Inactive) Attending Physician Gastroenterology  01/18/16   Bobbitt, Elgin Pepper, MD Consulting Physician Allergy  and Immunology  01/22/17     Chief Complaint  Patient presents with   Abrasion    F/U.    Cellulitis     Subjective: Heather Chambers is a 68 y.o. Pt presents for an OV to follow-up on left shoulder cellulitis.  Patient was seen last week with excoriated lesion with mild redness surrounding tracking down towards chest wall.  Doxycycline  twice daily was prescribed.  Patient reports she has been cleaning with the Hibiclens twice daily as instructed and warm compresses.  She states initially she noticed a lump and more painful for a few days, and then swelling since has receded redness of the chest wall has resolved.  She denies any fevers or chills.  Prior note: complaints of possible infection of a cut on her left shoulder of 3 days duration.  Patient reports she had picked/scratched at a skin irritation on her left shoulder, then noticed on Saturday it was becoming swollen and red.  She did soak with warm compresses and was able to expel a small amount of purulent drainage.  She reports the skin below the lesion started to become red and sore. She reports the skin lesion was from being in the sun she has been getting skin lesions that are itchy whenever she goes in the sun.  She denies any bug bites or lacerations. Tdap UTD 2021. Allergic to Augmentin  and cephalosporin.  She reports she has been placing Neosporin over with a Band-Aid. She denies fevers or chills.     11/10/2023   11:38 AM 02/28/2023    9:51 AM 01/18/2022    9:05 AM 01/18/2021    9:34 AM 01/27/2020    9:34 AM  Depression screen PHQ 2/9  Decreased Interest 0 1 0 0 0  Down, Depressed, Hopeless 0 1 0  0 0  PHQ - 2 Score 0 2 0 0 0  Altered sleeping  1     Tired, decreased energy  1     Change in appetite  0     Feeling bad or failure about yourself   0     Trouble concentrating  0     Moving slowly or fidgety/restless  0     Suicidal thoughts  0     PHQ-9 Score  4     Difficult doing work/chores  Not difficult at all       Allergies  Allergen Reactions   Augmentin  [Amoxicillin -Pot Clavulanate] Nausea And Vomiting   Cefdinir Hives   Etodolac Other (See Comments)    LODINE   Phenergan  [Promethazine  Hcl]     Restlessness, twitching   Social History   Social History Narrative   Heather Chambers lives with her husband. She has a grown son who recently moved to [place] for a job. 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    Wears seatbelt.    Exercises 3x a week.    Smoke alarm in the home.    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   Lef   Menopausal symptoms    Migraine    Osteopenia 03/2017   T score -2.1 FRAX 7.5% / 0.7%   Past Surgical History:  Procedure Laterality Date   FOOT SURGERY Right 05/31/2016   joint replacement   PELVIC LAPAROSCOPY     DIAG LAP   Family History  Problem Relation Age of Onset   Allergic rhinitis Mother    Migraines Mother    Osteoarthritis Mother    Breast cancer Sister 28   Breast cancer Maternal Grandmother 32   Allergic rhinitis Maternal Grandmother    Heart disease Maternal Grandfather    Angioedema Neg Hx    Asthma Neg Hx    Atopy Neg Hx    Eczema Neg Hx     Immunodeficiency Neg Hx    Urticaria Neg Hx    Allergies as of 11/17/2023       Reactions   Augmentin  [amoxicillin -pot Clavulanate] Nausea And Vomiting   Cefdinir Hives   Etodolac Other (See Comments)   LODINE   Phenergan  [promethazine  Hcl]    Restlessness, twitching        Medication List        Accurate as of November 17, 2023 11:53 AM. If you have any questions, ask your nurse or doctor.          Ajovy  225 MG/1.5ML Soaj Generic drug: Fremanezumab -vfrm Inject 225 mg into the skin every 30 (thirty) days.   azelastine  0.05 % ophthalmic solution Commonly known as: OPTIVAR  Place 1 drop into both eyes 2 (two) times daily.   doxycycline  100 MG tablet Commonly known as: VIBRA -TABS Take 1 tablet (100 mg total) by mouth 2 (two) times daily.   estradiol  0.1 MG/GM vaginal cream Commonly known as: ESTRACE  Use 1/2 g vaginally every night for the first 2 weeks, then use 1/2 g vaginally two or three times per week as needed to maintain symptom relief.   Estradiol -Norethindrone  Acet 0.5-0.1 MG tablet Take 1 tablet by mouth daily.   gabapentin  100 MG capsule Commonly known as: Neurontin  Cand take 100mg  twice daily as needed and up to 300mg  in the evening for symptoms   ipratropium 0.06 % nasal spray Commonly known as: ATROVENT  PLACE 2 SPRAYS INTO BOTH NOSTRILS 3 (THREE) TIMES DAILY   ondansetron  4 MG disintegrating tablet Commonly known as: ZOFRAN -ODT Take 1-2 tablets (4-8 mg total) by mouth every 8 (eight) hours as needed for nausea.   rizatriptan  10 MG disintegrating tablet Commonly known as: MAXALT -MLT Take 1 tablet (10 mg total) by mouth as needed for migraine. May repeat in 2 hours if needed. Max 2 tablets in 24 hours.   Synthroid  75 MCG tablet Generic drug: levothyroxine  Take 1 tablet (75 mcg total) by mouth daily. on an empty stomach   valACYclovir  500 MG tablet Commonly known as: VALTREX  TAKE 1 TABLET TWICE A DAY AS NEEDED        All past medical  history, surgical history, allergies, family history, immunizations andmedications were updated in the EMR today and reviewed under the history and medication portions of their EMR.  ROS Negative, with the exception of above mentioned in HPI   Objective:  BP 120/76   Pulse 73   Temp 98.1 F (36.7 C)   Wt 135 lb 9.6 oz (61.5 kg)   LMP 02/12/2008 (Approximate)   SpO2 95%   BMI 24.02 kg/m  Body mass index is 24.02 kg/m.  Physical Exam Vitals and nursing note reviewed.  Constitutional:      General: She is not in acute distress.    Appearance: Normal appearance. She is normal weight. She is not ill-appearing or toxic-appearing.  HENT:     Head: Normocephalic and atraumatic.  Eyes:     General: No scleral icterus.       Right eye: No discharge.        Left eye: No discharge.     Extraocular Movements: Extraocular movements intact.     Conjunctiva/sclera: Conjunctivae normal.     Pupils: Pupils are equal, round, and reactive to light.  Skin:    Findings: No rash.     Comments: Left anterior medial shoulder-original skin lesion healed.  No fluctuance.  No drainage.  No bleeding.  Redness surrounding skin lesion and inferior to skin lesion have resolved. .No tenderness to palpation today.  Neurological:     Mental Status: She is alert and oriented to person, place, and time. Mental status is at baseline.     Motor: No weakness.     Coordination: Coordination normal.     Gait: Gait normal.  Psychiatric:        Mood and Affect: Mood normal.        Behavior: Behavior normal.        Thought Content: Thought content normal.        Judgment: Judgment normal.     No results found. No results found. No results found for this or any previous visit (from the past 24 hours).  Assessment/Plan: ALMADELIA LOOMAN is a 68 y.o. female present for OV for  Cellulitis of chest wall (Primary) Mild cellulitis surrounding excoriated skin lesion has resolved.  Patient likely experienced early  infectious tenosynovitis of biceps tendon.  There is a purpleish skin change surrounding the original lesion on the shoulder over tendon, likely representing swelling that she had experienced.  The redness and tenderness have completely resolved.  Original lesion has healed nicely. Continue Hibiclens wash twice daily Continue Epson salt warm compress soaks twice daily Followed by  ointment application Doxy twice daily x 7 days extended for total of 14 days Follow-up 5 to 7 days, prn  Reviewed expectations re: course of current medical issues. Discussed self-management of symptoms. Outlined signs and symptoms indicating need for more acute intervention. Patient verbalized understanding and all questions were answered. Patient received an After-Visit Summary.    No orders of the defined types were placed in this encounter.  Meds ordered this encounter  Medications   DISCONTD: doxycycline  (VIBRA -TABS) 100 MG tablet    Sig: Take 1 tablet (100 mg total) by mouth 2 (two) times daily.    Dispense:  14 tablet    Refill:  0   doxycycline  (VIBRA -TABS) 100 MG tablet    Sig: Take 1 tablet (100 mg total) by mouth 2 (two) times daily.    Dispense:  14 tablet    Refill:  0   Referral Orders  No referral(s) requested today     Note is dictated utilizing voice recognition software. Although note has been proof read prior to signing, occasional typographical errors still  can be missed. If any questions arise, please do not hesitate to call for verification.   electronically signed by:  Charlies Bellini, DO  [facility] - [place]

## 2023-12-17 ENCOUNTER — Other Ambulatory Visit: Payer: Self-pay | Admitting: Obstetrics and Gynecology

## 2023-12-17 NOTE — Telephone Encounter (Signed)
.  Med refill request: Estradiol   Last AEX: 03/29/23 Next OV: 04/02/23 Last MMG (if hormonal med) 05/29/23 Refill authorized: Please Advise?

## 2024-01-02 ENCOUNTER — Other Ambulatory Visit: Payer: Self-pay | Admitting: Family Medicine

## 2024-01-02 DIAGNOSIS — J3089 Other allergic rhinitis: Secondary | ICD-10-CM

## 2024-01-28 ENCOUNTER — Other Ambulatory Visit: Payer: Self-pay | Admitting: Family Medicine

## 2024-01-28 DIAGNOSIS — J3089 Other allergic rhinitis: Secondary | ICD-10-CM

## 2024-02-17 ENCOUNTER — Encounter: Payer: Self-pay | Admitting: Family Medicine

## 2024-02-25 ENCOUNTER — Ambulatory Visit (INDEPENDENT_AMBULATORY_CARE_PROVIDER_SITE_OTHER)

## 2024-02-25 VITALS — BP 128/68 | HR 84 | Wt 129.4 lb

## 2024-02-25 DIAGNOSIS — Z0000 Encounter for general adult medical examination without abnormal findings: Secondary | ICD-10-CM

## 2024-02-25 NOTE — Patient Instructions (Signed)

## 2024-02-25 NOTE — Progress Notes (Signed)
 "  Chief Complaint  Patient presents with   Medicare Wellness     Subjective:   Heather Chambers is a 69 y.o. female who presents for a Medicare Annual Wellness Visit.  Visit info / Clinical Intake: Medicare Wellness Visit Type:: Subsequent Annual Wellness Visit Persons participating in visit and providing information:: patient Medicare Wellness Visit Mode:: In-person (required for WTM) Interpreter Needed?: No Pre-visit prep was completed: no AWV questionnaire completed by patient prior to visit?: yes Date:: 02/24/24 Living arrangements:: lives with spouse/significant other Patient's Overall Health Status Rating: very good Typical amount of pain: some Does pain affect daily life?: no Are you currently prescribed opioids?: no  Dietary Habits and Nutritional Risks How many meals a day?: 3 Eats fruit and vegetables daily?: yes Most meals are obtained by: preparing own meals In the last 2 weeks, have you had any of the following?: none Diabetic:: no  Functional Status Activities of Daily Living (to include ambulation/medication): Independent Ambulation: Independent Medication Administration: Independent Home Management (perform basic housework or laundry): Independent Manage your own finances?: yes Primary transportation is: driving Concerns about vision?: no *vision screening is required for WTM* Concerns about hearing?: no  Fall Screening Falls in the past year?: 0 Number of falls in past year: 0 Was there an injury with Fall?: 0 Fall Risk Category Calculator: 0 Patient Fall Risk Level: Low Fall Risk  Fall Risk Patient at Risk for Falls Due to: No Fall Risks Fall risk Follow up: Falls evaluation completed  Home and Transportation Safety: All rugs have non-skid backing?: yes All stairs or steps have railings?: yes Grab bars in the bathtub or shower?: yes Have non-skid surface in bathtub or shower?: yes Good home lighting?: yes Regular seat belt use?: yes Hospital  stays in the last year:: no  Cognitive Assessment Difficulty concentrating, remembering, or making decisions? : no Will 6CIT or Mini Cog be Completed: no 6CIT or Mini Cog Declined: patient alert, oriented, able to answer questions appropriately and recall recent events What year is it?: 0 points What month is it?: 0 points Give patient an address phrase to remember (5 components): Ginnie took the car to the store About what time is it?: 0 points Count backwards from 20 to 1: 0 points Say the months of the year in reverse: 0 points Repeat the address phrase from earlier: 0 points 6 CIT Score: 0 points  Advance Directives (For Healthcare) Does Patient Have a Medical Advance Directive?: Yes Does patient want to make changes to medical advance directive?: No - Patient declined Type of Advance Directive: Healthcare Power of [place]; Living will Copy of Healthcare Power of Attorney in Chart?: No - copy requested Copy of Living Will in Chart?: No - copy requested  Reviewed/Updated  Reviewed/Updated: Reviewed All (Medical, Surgical, Family, Medications, Allergies, Care Teams, Patient Goals)    Allergies (verified) Augmentin  [amoxicillin -pot clavulanate], Cefdinir, Etodolac, and Phenergan  [promethazine  hcl]   Current Medications (verified) Outpatient Encounter Medications as of 02/25/2024  Medication Sig   azelastine  (OPTIVAR ) 0.05 % ophthalmic solution Place 1 drop into both eyes 2 (two) times daily.   doxycycline  (VIBRA -TABS) 100 MG tablet Take 1 tablet (100 mg total) by mouth 2 (two) times daily.   estradiol  (ESTRACE ) 0.01 % CREA vaginal cream PLEASE SEE ATTACHED FOR DETAILED DIRECTIONS   Estradiol -Norethindrone  Acet 0.5-0.1 MG tablet Take 1 tablet by mouth daily.   Fremanezumab -vfrm (AJOVY ) 225 MG/1.5ML SOAJ Inject 225 mg into the skin every 30 (thirty) days.   gabapentin  (NEURONTIN ) 100 MG  capsule Cand take 100mg  twice daily as needed and up to 300mg  in the evening for symptoms    ipratropium (ATROVENT ) 0.06 % nasal spray PLACE 2 SPRAYS INTO BOTH NOSTRILS 3 (THREE) TIMES DAILY   ondansetron  (ZOFRAN -ODT) 4 MG disintegrating tablet Take 1-2 tablets (4-8 mg total) by mouth every 8 (eight) hours as needed for nausea.   rizatriptan  (MAXALT -MLT) 10 MG disintegrating tablet Take 1 tablet (10 mg total) by mouth as needed for migraine. May repeat in 2 hours if needed. Max 2 tablets in 24 hours.   SYNTHROID  75 MCG tablet Take 1 tablet (75 mcg total) by mouth daily. on an empty stomach   valACYclovir  (VALTREX ) 500 MG tablet TAKE 1 TABLET TWICE A DAY AS NEEDED   No facility-administered encounter medications on file as of 02/25/2024.    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   Lef   Menopausal symptoms    Migraine    Osteopenia 03/2017   T score -2.1 FRAX 7.5% / 0.7%   Past Surgical History:  Procedure Laterality Date   FOOT SURGERY Right 05/31/2016   joint replacement   PELVIC LAPAROSCOPY     DIAG LAP   Family History  Problem Relation Age of Onset   Allergic rhinitis Mother    Migraines Mother    Osteoarthritis Mother    Breast cancer Sister 32   Breast cancer Maternal Grandmother 48   Allergic rhinitis Maternal Grandmother    Heart disease Maternal Grandfather    Angioedema Neg Hx    Asthma Neg Hx    Atopy Neg Hx    Eczema Neg Hx    Immunodeficiency Neg Hx    Urticaria Neg Hx    Social History   Occupational History    Employer: [employer]  Tobacco Use   Smoking status: Never   Smokeless tobacco: Never  Vaping Use   Vaping status: Never Used  Substance and Sexual Activity    Alcohol use: Yes    Alcohol/week: 4.0 standard drinks of alcohol    Types: 4 Cans of beer per week    Comment: wine and beer   Drug use: No   Sexual activity: Yes    Birth control/protection: Post-menopausal    Comment: less than 5, IC after 16, no STD, no abnormal pap, no DES   Tobacco Counseling Counseling given: Not Answered  SDOH Screenings   Food Insecurity: No Food Insecurity (02/25/2024)  Housing: Low Risk (02/25/2024)  Transportation Needs: No Transportation Needs (02/25/2024)  Utilities: Not At Risk (02/25/2024)  Alcohol Screen: Low Risk (02/24/2024)  Depression (PHQ2-9): Low Risk (02/25/2024)  Financial Resource Strain: Low Risk (02/24/2024)  Physical Activity: Inactive (02/25/2024)  Social Connections: Moderately Integrated (02/25/2024)  Recent Concern: Social Connections - Moderately Isolated (02/24/2024)  Stress: No Stress Concern Present (02/25/2024)  Tobacco Use: Low Risk (02/25/2024)  Health Literacy: Adequate Health Literacy (02/25/2024)   See flowsheets for full screening details  Depression Screen PHQ 2 & 9 Depression Scale- Over the past 2 weeks, how often have you been bothered by any of the following problems? Little  interest or pleasure in doing things: 0 Feeling down, depressed, or hopeless (PHQ Adolescent also includes...irritable): 0 PHQ-2 Total Score: 0 Trouble falling or staying asleep, or sleeping too much: 1 Feeling tired or having little energy: 1 Poor appetite or overeating (PHQ Adolescent also includes...weight loss): 0 Feeling bad about yourself - or that you are a failure or have let yourself or your family down: 0 Trouble concentrating on things, such as reading the newspaper or watching television (PHQ Adolescent also includes...like school work): 0 Moving or speaking so slowly that other people could have noticed. Or the opposite - being so fidgety or restless that you have been moving around a lot more than usual: 0 Thoughts that you would be better  off dead, or of hurting yourself in some way: 0 PHQ-9 Total Score: 4 If you checked off any problems, how difficult have these problems made it for you to do your work, take care of things at home, or get along with other people?: Not difficult at all     Goals Addressed             This Visit's Progress    Patient Stated       Get healthy again and begin exercising  again             Objective:    Today's Vitals   02/25/24 0927  BP: 128/68  Pulse: 84  Weight: 129 lb 6.4 oz (58.7 kg)   Body mass index is 22.92 kg/m.  Hearing/Vision screen No results found. Immunizations and Health Maintenance Health Maintenance  Topic Date Due   Bone Density Scan  11/27/2024   Medicare Annual Wellness (AWV)  02/24/2025   Mammogram  05/28/2025   Fecal DNA (Cologuard)  04/25/2026   DTaP/Tdap/Td (3 - Td or Tdap) 01/26/2030   Pneumococcal Vaccine: 50+ Years  Completed   Influenza Vaccine  Completed   Hepatitis C Screening  Completed   Zoster Vaccines- Shingrix  Completed   Meningococcal B Vaccine  Aged Out   Colonoscopy  Discontinued   COVID-19 Vaccine  Discontinued        Assessment/Plan:  This is a routine wellness examination for Heather Chambers.  Patient Care Team: Catherine Charlies LABOR, DO as PCP - General (Family Medicine) Teressa Toribio SQUIBB, MD (Inactive) as Attending Physician (Gastroenterology) Bobbitt, Elgin Pepper, MD as Consulting Physician (Allergy  and Immunology)  I have personally reviewed and noted the following in the patients chart:   Medical and social history Use of alcohol, tobacco or illicit drugs  Current medications and supplements including opioid prescriptions. Functional ability and status Nutritional status Physical activity Advanced directives List of other physicians Hospitalizations, surgeries, and ER visits in previous 12 months Vitals Screenings to include cognitive, depression, and falls Referrals and appointments  No orders of the defined types  were placed in this encounter.  In addition, I have reviewed and discussed with patient certain preventive protocols, quality metrics, and best practice recommendations. A written personalized care plan for preventive services as well as general preventive health recommendations were provided to patient.   Shanda LELON Sharps, CMA   02/25/2024   Return in 1 year (on 02/24/2025).  After Visit Summary: (In Person-Declined) Patient declined AVS at this time.  Nurse Notes: Non-Face to Face or Face to Face 10 minute visit Encounter   Heather Chambers,  Thank you for taking the time for your Medicare Wellness Visit. I appreciate your continued commitment to your health goals. Please review the care plan  we discussed, and feel free to reach out if I can assist you further.  Please note that Annual Wellness Visits do not include a physical exam. Some assessments may be limited, especially if the visit was conducted virtually. If needed, we may recommend an in-person follow-up with your provider.  Ongoing Care Seeing your primary care provider every 3 to 6 months helps us  monitor your health and provide consistent, personalized care.   Referrals If a referral was made during today's visit and you haven't received any updates within two weeks, please contact the referred provider directly to check on the status.  Recommended Screenings:  Health Maintenance  Topic Date Due   Osteoporosis screening with Bone Density Scan  11/27/2024   Medicare Annual Wellness Visit  02/24/2025   Breast Cancer Screening  05/28/2025   Cologuard (Stool DNA test)  04/25/2026   DTaP/Tdap/Td vaccine (3 - Td or Tdap) 01/26/2030   Pneumococcal Vaccine for age over 31  Completed   Flu Shot  Completed   Hepatitis C Screening  Completed   Zoster (Shingles) Vaccine  Completed   Meningitis B Vaccine  Aged Out   Colon Cancer Screening  Discontinued   COVID-19 Vaccine  Discontinued       02/25/2024    9:16 AM  Advanced  Directives  Does Patient Have a Medical Advance Directive? Yes  Type of Estate Agent of [place];Living will  Does patient want to make changes to medical advance directive? No - Patient declined  Copy of Healthcare Power of Attorney in Chart? No - copy requested    Vision: Annual vision screenings are recommended for early detection of glaucoma, cataracts, and diabetic retinopathy. These exams can also reveal signs of chronic conditions such as diabetes and high blood pressure.  Dental: Annual dental screenings help detect early signs of oral cancer, gum disease, and other conditions linked to overall health, including heart disease and diabetes.  Please see the attached documents for additional preventive care recommendations.  "

## 2024-03-02 ENCOUNTER — Other Ambulatory Visit: Payer: Self-pay

## 2024-03-02 MED ORDER — ESTRADIOL-NORETHINDRONE ACET 0.5-0.1 MG PO TABS: 1.0000 | ORAL_TABLET | Freq: Every day | ORAL | 0 refills | Status: AC

## 2024-03-02 NOTE — Telephone Encounter (Signed)
 Med refill request:      ESTRADIOL -NORETHINDRONE  ACET 0.5-0.1 MG PO TABS  ([facility]  Lo)  Start:  05/17/23 Disp:  90 tablets Refills:  3  Last AEX:  03/26/23 Next OV:  04/01/24 - Med Check Next AEX:  Not yet scheduled Last MMG (if hormonal med):  05/29/23 Refill authorized? Please Advise.

## 2024-03-03 ENCOUNTER — Telehealth: Payer: Self-pay | Admitting: Adult Health

## 2024-03-03 NOTE — Telephone Encounter (Signed)
 Patient cancelled appointment due to going out of town to care of mother. Will call back to reschedule

## 2024-03-05 ENCOUNTER — Encounter: Payer: Self-pay | Admitting: Family Medicine

## 2024-03-05 ENCOUNTER — Telehealth: Admitting: Family Medicine

## 2024-03-05 VITALS — Ht 63.0 in

## 2024-03-05 DIAGNOSIS — J3089 Other allergic rhinitis: Secondary | ICD-10-CM | POA: Diagnosis not present

## 2024-03-05 DIAGNOSIS — R059 Cough, unspecified: Secondary | ICD-10-CM

## 2024-03-05 DIAGNOSIS — J988 Other specified respiratory disorders: Secondary | ICD-10-CM

## 2024-03-05 MED ORDER — DOXYCYCLINE HYCLATE 100 MG PO TABS: 100.0000 mg | ORAL_TABLET | Freq: Two times a day (BID) | ORAL | 0 refills | Status: AC

## 2024-03-05 MED ORDER — BENZONATATE 100 MG PO CAPS: 100.0000 mg | ORAL_CAPSULE | Freq: Two times a day (BID) | ORAL | 0 refills | Status: AC | PRN

## 2024-03-05 MED ORDER — FLUTICASONE PROPIONATE 50 MCG/ACT NA SUSP: 1.0000 | Freq: Two times a day (BID) | NASAL | 3 refills | Status: AC

## 2024-03-05 NOTE — Progress Notes (Signed)
 Virtual Visit via Video Note I connected with Heather Chambers on 03/05/24 by a video enabled telemedicine application and verified that I am speaking with the correct person using two identifiers. Location patient: home Location provider:work office Persons participating in the virtual visit: patient, provider  I discussed the limitations of evaluation and management by telemedicine and the availability of in person appointments. The patient expressed understanding and agreed to proceed.  Chief Complaint  Patient presents with   Sinus Problem    Congestion, headache, sore throat, nasal drainage, cough x 1 month    Discussed the use of AI scribe software for clinical note transcription with the patient, who gave verbal consent to proceed.  History of Present Illness Heather Chambers is a 69 year old female with PMHx significant for chronic migraine headaches, allergy  rhinitis, hypothyroidism, and neurogenic pruritus being seen today for above symptoms.  She has experienced nasal congestion, frontal headaches, sore throat, postnasal drainage, and cough for the past month, beginning in the last week of December. Initially, she felt very unwell for four to five days but then began to improve. However, in the past two days, her symptoms have worsened, with a heavy feeling in her chest and sinus pressure/edema.  Her cough is dry and persistent, sometimes leading to coughing spells that are difficult to stop. It is aggravated by talking and has been severe enough to disrupt her sleep. She notes a metallic taste in her mouth, which she attributes to sinus drainage. No fever or chills have been present throughout the illness. No recent sick contact.  She has been using saline nasal spray and Mucinex without significant relief.  She also uses Atrovent  nasal spray for allergic rhinitis.  She has a history of allergies and sinus infections, although she has not had a sinus infection in a while. She feels  pressure building in her forehead, which she associates with her headaches.  No wheezing, difficulty breathing, or chest pain has been experienced, although she did notice a high-pitched sound when coughing one night.  She has not taken a COVID or flu test, as she was improving and did not have access to tests at the time. She lost her sense of taste initially but attributed it to sinus congestion.  Negative for abdominal pain, N/V, urinary symptoms myalgias,or skin rash.  ROS: See pertinent positives and negatives per HPI.  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   Lef   Menopausal symptoms    Migraine    Osteopenia 03/2017   T score -2.1 FRAX 7.5% / 0.7%    Past Surgical History:  Procedure Laterality Date   FOOT SURGERY Right 05/31/2016   joint replacement   PELVIC LAPAROSCOPY     DIAG LAP    Family History  Problem Relation Age of Onset   Allergic rhinitis Mother    Migraines Mother    Osteoarthritis Mother    Breast cancer Sister 47   Breast cancer Maternal Grandmother 54   Allergic rhinitis Maternal Grandmother    Heart disease Maternal Grandfather    Angioedema Neg Hx  Asthma Neg Hx    Atopy Neg Hx    Eczema Neg Hx    Immunodeficiency Neg Hx    Urticaria Neg Hx     Social History   Socioeconomic History   Marital status: Married    Spouse name: Not on file   Number of children: 1   Years of education: Not on file   Highest education level: Master's degree (e.g., MA, MS, MEng, MEd, MSW, MBA)  Occupational History    Employer: [employer]  Tobacco Use   Smoking status: Never   Smokeless  tobacco: Never  Vaping Use   Vaping status: Never Used  Substance and Sexual Activity   Alcohol use: Yes    Alcohol/week: 4.0 standard drinks of alcohol    Types: 4 Cans of beer per week    Comment: wine and beer   Drug use: No   Sexual activity: Yes    Birth control/protection: Post-menopausal    Comment: less than 5, IC after 18, no STD, no abnormal pap, no DES  Other Topics Concern   Not on file  Social History Narrative   Ms. Chismar lives with her husband. She has a grown son who recently moved to [place] for a job. 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    Wears seatbelt.    Exercises 3x a week.    Smoke alarm in the home.    Feels safe in relationships   Right handed   Drinks decaf tea and coffee   Social Drivers of Health   Tobacco Use: Low Risk (03/05/2024)   Patient History    Smoking Tobacco Use: Never    Smokeless Tobacco Use: Never    Passive Exposure: Not on file  Financial Resource Strain: Low Risk (02/24/2024)   Overall Financial Resource Strain (CARDIA)    Difficulty of Paying Living Expenses: Not hard at all  Food Insecurity: No Food Insecurity (02/25/2024)   Epic    Worried About Programme Researcher, Broadcasting/film/video in the Last Year: Never true    Ran Out of Food in the Last Year: Never true  Transportation Needs: No Transportation Needs (02/25/2024)   Epic    Lack of Transportation (Medical): No    Lack of Transportation (Non-Medical): No  Physical Activity: Inactive (02/25/2024)   Exercise Vital Sign    Days of Exercise per Week: 0 days    Minutes of Exercise per Session: 0 min  Stress: No Stress Concern Present (02/25/2024)   [employer] of Occupational Health - Occupational Stress Questionnaire    Feeling of Stress: Only a little  Social Connections: Moderately Integrated (02/25/2024)   Social Connection and Isolation Panel    Frequency of Communication with Friends and Family: More than three times a week     Frequency of Social Gatherings with Friends and Family: Once a week    Attends Religious Services: Never    Database Administrator or Organizations: No    Attends Engineer, Structural: More than 4 times per year    Marital Status: Married  Recent Concern: Social Connections - Moderately Isolated (02/24/2024)   Social Connection and Isolation Panel    Frequency of Communication with Friends and Family: More than three times a week    Frequency of Social Gatherings with Friends and Family: Once a week    Attends Religious Services: Never    Database Administrator or Organizations: No  Attends Banker Meetings: Not on file    Marital Status: Married  Intimate Partner Violence: Not At Risk (02/25/2024)   Epic    Fear of Current or Ex-Partner: No    Emotionally Abused: No    Physically Abused: No    Sexually Abused: No  Depression (PHQ2-9): Low Risk (02/25/2024)   Depression (PHQ2-9)    PHQ-2 Score: 0  Alcohol Screen: Low Risk (02/24/2024)   Alcohol Screen    Last Alcohol Screening Score (AUDIT): 2  Housing: Low Risk (02/25/2024)   Epic    Unable to Pay for Housing in the Last Year: No    Number of Times Moved in the Last Year: 0    Homeless in the Last Year: No  Utilities: Not At Risk (02/25/2024)   Epic    Threatened with loss of utilities: No  Health Literacy: Adequate Health Literacy (02/25/2024)   B1300 Health Literacy    Frequency of need for help with medical instructions: Never    Current Medications[1]  EXAM:  VITALS per patient if applicable:Ht 5' 3 (1.6 m)   LMP 02/12/2008   BMI 22.92 kg/m   GENERAL: alert, oriented, appears well and in no acute distress  HEENT: atraumatic, conjunctiva clear, no obvious abnormalities on inspection of external nose and ears. No facial edema or erythema appreciated.  NECK: normal movements of the head and neck  LUNGS: on inspection no signs of respiratory distress, breathing rate appears normal, no obvious  gross SOB, gasping or wheezing.  Episodes of nonproductive cough during visit.  CV: no obvious cyanosis  MS: moves all visible extremities without noticeable abnormality  PSYCH/NEURO: pleasant and cooperative, no obvious depression or anxiety, speech and thought processing grossly intact  ASSESSMENT AND PLAN:  Discussed the following assessment and plan:  Respiratory tract infection Could be viral or symptoms could be from allergies, in which case abx will not help. She could wait a couple days and start Doxycycline  if she does not feel any better. Continue adequate hydration and monitor for new symptoms.  -     Doxycycline  Hyclate; Take 1 tablet (100 mg total) by mouth 2 (two) times daily for 7 days.  Dispense: 14 tablet; Refill: 0  Cough, unspecified type We discussed possible etiologies. Could be a new viral illness, signs of complications of previous illness, and allergies. Plain Mucinex may help. Monitor for new symptoms, including fever. Instructed about warning signs. If symptoms have not resolved in 2 weeks, she needs to see her PCP, she is going to need chest x-ray.  -     Benzonatate ; Take 1 capsule (100 mg total) by mouth 2 (two) times daily as needed for up to 10 days.  Dispense: 20 capsule; Refill: 0  Allergic rhinitis with probable nonallergic component This problem could be contributing to her persistent cough. Recommend Flonase  nasal spray at bedtime for 10 to 14 days then as needed. She can continue Atrovent  nasal spray once 3 times per day as needed. Continue nasal saline irrigations as needed.  -     Fluticasone  Propionate; Place 1 spray into both nostrils 2 (two) times daily.  Dispense: 16 g; Refill: 3  We discussed possible serious and likely etiologies, options for evaluation and workup, limitations of telemedicine visit vs in person visit, treatment, treatment risks and precautions. The patient was advised to call back or seek an in-person evaluation if  the symptoms worsen or if the condition fails to improve as anticipated. I discussed the assessment and  treatment plan with the patient. The patient was provided an opportunity to ask questions and all were answered. The patient agreed with the plan and demonstrated an understanding of the instructions.  Return in about 2 weeks (around 03/19/2024), or if symptoms worsen or fail to improve, for cough and headaches with PCP.  Terrell Ostrand, MD      [1]  Current Outpatient Medications:    azelastine  (OPTIVAR ) 0.05 % ophthalmic solution, Place 1 drop into both eyes 2 (two) times daily. (Patient taking differently: Place 1 drop into both eyes 2 (two) times daily. As needed), Disp: 6 mL, Rfl: 11   benzonatate  (TESSALON ) 100 MG capsule, Take 1 capsule (100 mg total) by mouth 2 (two) times daily as needed for up to 10 days., Disp: 20 capsule, Rfl: 0   doxycycline  (VIBRA -TABS) 100 MG tablet, Take 1 tablet (100 mg total) by mouth 2 (two) times daily for 7 days., Disp: 14 tablet, Rfl: 0   estradiol  (ESTRACE ) 0.01 % CREA vaginal cream, PLEASE SEE ATTACHED FOR DETAILED DIRECTIONS, Disp: 42.5 g, Rfl: 1   Estradiol -Norethindrone  Acet 0.5-0.1 MG tablet, Take 1 tablet by mouth daily., Disp: 90 tablet, Rfl: 0   fluticasone  (FLONASE ) 50 MCG/ACT nasal spray, Place 1 spray into both nostrils 2 (two) times daily., Disp: 16 g, Rfl: 3   Fremanezumab -vfrm (AJOVY ) 225 MG/1.5ML SOAJ, Inject 225 mg into the skin every 30 (thirty) days., Disp: 1.5 mL, Rfl: 11   gabapentin  (NEURONTIN ) 100 MG capsule, Cand take 100mg  twice daily as needed and up to 300mg  in the evening for symptoms, Disp: 150 capsule, Rfl: 3   ipratropium (ATROVENT ) 0.06 % nasal spray, PLACE 2 SPRAYS INTO BOTH NOSTRILS 3 (THREE) TIMES DAILY, Disp: 45 mL, Rfl: 0   ondansetron  (ZOFRAN -ODT) 4 MG disintegrating tablet, Take 1-2 tablets (4-8 mg total) by mouth every 8 (eight) hours as needed for nausea., Disp: 30 tablet, Rfl: 11   rizatriptan  (MAXALT -MLT) 10 MG  disintegrating tablet, Take 1 tablet (10 mg total) by mouth as needed for migraine. May repeat in 2 hours if needed. Max 2 tablets in 24 hours., Disp: 27 tablet, Rfl: 4   SYNTHROID  75 MCG tablet, Take 1 tablet (75 mcg total) by mouth daily. on an empty stomach, Disp: 90 tablet, Rfl: 3   valACYclovir  (VALTREX ) 500 MG tablet, TAKE 1 TABLET TWICE A DAY AS NEEDED, Disp: 30 tablet, Rfl: 11

## 2024-03-05 NOTE — Telephone Encounter (Signed)
 Spoke with pt, advised appt with another provider for this afternoon. She would prefer not to come in office, offered virtual and pt agreed. Appt scheduled

## 2024-03-11 ENCOUNTER — Other Ambulatory Visit: Payer: Self-pay | Admitting: Family Medicine

## 2024-03-19 ENCOUNTER — Ambulatory Visit: Payer: Self-pay

## 2024-03-19 ENCOUNTER — Encounter: Payer: Self-pay | Admitting: Sports Medicine

## 2024-03-19 ENCOUNTER — Ambulatory Visit: Admitting: Sports Medicine

## 2024-03-19 VITALS — BP 120/78 | HR 89 | Temp 98.4°F | Wt 133.8 lb

## 2024-03-19 DIAGNOSIS — R0982 Postnasal drip: Secondary | ICD-10-CM

## 2024-03-19 DIAGNOSIS — R058 Other specified cough: Secondary | ICD-10-CM

## 2024-03-19 DIAGNOSIS — K219 Gastro-esophageal reflux disease without esophagitis: Secondary | ICD-10-CM

## 2024-03-19 LAB — CBC WITH DIFFERENTIAL/PLATELET
Basophils Absolute: 0.1 10*3/uL (ref 0.0–0.1)
Basophils Relative: 1.8 % (ref 0.0–3.0)
Eosinophils Absolute: 0.3 10*3/uL (ref 0.0–0.7)
Eosinophils Relative: 4.3 % (ref 0.0–5.0)
HCT: 40.4 % (ref 36.0–46.0)
Hemoglobin: 13.7 g/dL (ref 12.0–15.0)
Lymphocytes Relative: 32.6 % (ref 12.0–46.0)
Lymphs Abs: 2.1 10*3/uL (ref 0.7–4.0)
MCHC: 33.8 g/dL (ref 30.0–36.0)
MCV: 90 fl (ref 78.0–100.0)
Monocytes Absolute: 0.5 10*3/uL (ref 0.1–1.0)
Monocytes Relative: 7.1 % (ref 3.0–12.0)
Neutro Abs: 3.5 10*3/uL (ref 1.4–7.7)
Neutrophils Relative %: 54.2 % (ref 43.0–77.0)
Platelets: 317 10*3/uL (ref 150.0–400.0)
RBC: 4.49 Mil/uL (ref 3.87–5.11)
RDW: 15 % (ref 11.5–15.5)
WBC: 6.4 10*3/uL (ref 4.0–10.5)

## 2024-03-19 MED ORDER — MONTELUKAST SODIUM 10 MG PO TABS: 10.0000 mg | ORAL_TABLET | Freq: Every day | ORAL | 0 refills | Status: AC

## 2024-03-19 MED ORDER — PANTOPRAZOLE SODIUM 40 MG PO TBEC: 40.0000 mg | DELAYED_RELEASE_TABLET | Freq: Every day | ORAL | 3 refills | Status: AC

## 2024-03-19 MED ORDER — FEXOFENADINE HCL 180 MG PO TABS: 180.0000 mg | ORAL_TABLET | Freq: Every day | ORAL | 0 refills | Status: AC

## 2024-03-19 MED ORDER — DM-GUAIFENESIN ER 30-600 MG PO TB12: 1.0000 | ORAL_TABLET | Freq: Two times a day (BID) | ORAL | 0 refills | Status: AC

## 2024-03-19 NOTE — Telephone Encounter (Signed)
 FYI Only or Action Required?: Action required by provider: request for appointment.  Patient was last seen in primary care on 03/05/2024 by Jordan, Betty G, MD.  Called Nurse Triage reporting Cough.  Symptoms began several weeks ago.  Interventions attempted: Prescription medications: finished antibiotics. and Rest, hydration, or home remedies.  Symptoms are: unchanged.Still coughing,chest tight from coughing.  Triage Disposition: See Physician Within 24 Hours  Patient/caregiver understands and will follow disposition?: Yes      ummary: Throat & chest pain   Reason for Triage: congestion, increased mucous, throat pain feels like a ball in throat, chest pain(believes due to excessive coughing), headaches and nausea symptoms having resolved with medication prescribed.     Reason for Disposition  [1] Continuous (nonstop) coughing interferes with work or school AND [2] no improvement using cough treatment per Care Advice  Answer Assessment - Initial Assessment Questions 1. ONSET: When did the cough begin?      Several weeks 2. SEVERITY: How bad is the cough today?      severe 3. SPUTUM: Describe the color of your sputum (e.g., none, dry cough; clear, white, yellow, green)     clear 4. HEMOPTYSIS: Are you coughing up any blood? If Yes, ask: How much? (e.g., flecks, streaks, tablespoons, etc.)     no 5. DIFFICULTY BREATHING: Are you having difficulty breathing? If Yes, ask: How bad is it? (e.g., mild, moderate, severe)      no 6. FEVER: Do you have a fever? If Yes, ask: What is your temperature, how was it measured, and when did it start?     no 7. CARDIAC HISTORY: Do you have any history of heart disease? (e.g., heart attack, congestive heart failure)      no 8. LUNG HISTORY: Do you have any history of lung disease?  (e.g., pulmonary embolus, asthma, emphysema)     no 9. PE RISK FACTORS: Do you have a history of blood clots? (or: recent major surgery,  recent prolonged travel, bedridden)     no 10. OTHER SYMPTOMS: Do you have any other symptoms? (e.g., runny nose, wheezing, chest pain)       Runny nose sore throat, tired, chest tight 11. PREGNANCY: Is there any chance you are pregnant? When was your last menstrual period?       no 12. TRAVEL: Have you traveled out of the country in the last month? (e.g., travel history, exposures)       no  Protocols used: Cough - Acute Productive-A-AH

## 2024-03-19 NOTE — Progress Notes (Signed)
 "  Careteam: Patient Care Team: Catherine Charlies LABOR, DO as PCP - General (Family Medicine) Teressa Toribio SQUIBB, MD (Inactive) as Attending Physician (Gastroenterology) Bobbitt, Elgin Pepper, MD as Consulting Physician (Allergy  and Immunology)  Allergies[1]  Chief Complaint  Patient presents with   Cough    Pt has been having upper respiratory symptoms on and off since December. Pt did a virtual recently and was prescribed antibiotics. She reports a metallic taste.    Discussed the use of AI scribe software for clinical note transcription with the patient, who gave verbal consent to proceed.  History of Present Illness    Heather Chambers is a 69 year old female who presents with a persistent cough and upper respiratory symptoms.  She initially developed a cold the week before Christmas, which improved but then worsened after her family, including her two-year-old grandson, visited for the holidays. She has been feeling run down and experiencing an upper respiratory illness that has persisted for nearly two months.  She has a persistent dry cough causing chest discomfort due to frequent coughing. The cough is worse at night and is exacerbated by talking or physical activity. She has been using over-the-counter cough medicine and cough drops, and was prescribed medication for the cough, which she took twice daily but later reduced to conserve the medication for nighttime use.  She was prescribed antibiotics and medication for nasal symptoms during a virtual appointment on January 23rd. Initially hesitant to take the antibiotics due to past adverse effects, she eventually completed a seven-day course, finishing the medication yesterday. She reports no fever but has experienced headaches, nausea, and a metallic taste in her mouth, which she attributes to postnasal drip.  She has been using a nasal spray twice daily and Atrovent  for her symptoms but has not been taking any allergy  medications since the  prescription. She reports a constant postnasal drip and has a history of allergies since moving to   in her twenties.  No history of asthma, COPD, or significant breathing problems prior to this illness, although she has experienced some pressure in her chest and slight breathing difficulty due to coughing.    Review of Systems:  Review of Systems  Constitutional:  Negative for chills and fever.  HENT:  Negative for congestion and sore throat.   Eyes:  Negative for blurred vision and double vision.  Respiratory:  Positive for cough. Negative for sputum production and shortness of breath.   Cardiovascular:  Negative for chest pain, palpitations and leg swelling.  Gastrointestinal:  Negative for abdominal pain, heartburn and nausea.  Genitourinary:  Negative for dysuria, frequency and hematuria.  Musculoskeletal:  Negative for falls and myalgias.  Neurological:  Negative for dizziness, sensory change and focal weakness.   Negative unless indicated in HPI.   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   Osteopenia 01/18/2022   Cold sore 02/10/2019   Allergic conjunctivitis 06/17/2017   Chronic migraine without aura 04/01/2017   Hormone replacement therapy (HRT) 01/22/2017   Arthritis 01/22/2017   Chronic pansinusitis 10/03/2015   Allergic rhinitis with probable nonallergic component 05/31/2014   Menopausal symptoms    Hypothyroidism 10/18/2010   Past Medical History:  Diagnosis Date   Allergy     Anxiety    Arthritis    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   Injection May 04, 2018   Chronic pelvic pain in female 18   LLQ  Chronic rhinitis    COVID-19 08/21/2020   Health maintenance examination    HSV-1 (herpes simplex virus 1) infection    Hypothyroidism    Labral tear of shoulder, degenerative, 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   Lef   Menopausal symptoms    Migraine     Osteopenia 03/2017   T score -2.1 FRAX 7.5% / 0.7%   Past Surgical History:  Procedure Laterality Date   FOOT SURGERY Right 05/31/2016   joint replacement   JOINT REPLACEMENT  2018   PELVIC LAPAROSCOPY     DIAG LAP   Social History[2] Family History  Problem Relation Age of Onset   Allergic rhinitis Mother    Migraines Mother    Osteoarthritis Mother    Arthritis Mother    Breast cancer Sister 29   Cancer Sister    Breast cancer Maternal Grandmother 55   Allergic rhinitis Maternal Grandmother    Cancer Maternal Grandmother    Varicose Veins Maternal Grandmother    Heart disease Maternal Grandfather    Stroke Maternal Grandfather    ADD / ADHD Son    Angioedema Neg Hx    Asthma Neg Hx    Atopy Neg Hx    Eczema Neg Hx    Immunodeficiency Neg Hx    Urticaria Neg Hx    Allergies[3]  Medications: Patient's Medications  New Prescriptions   DEXTROMETHORPHAN-GUAIFENESIN  (MUCINEX  DM) 30-600 MG 12HR TABLET    Take 1 tablet by mouth 2 (two) times daily.   FEXOFENADINE  (ALLEGRA  ALLERGY ) 180 MG TABLET    Take 1 tablet (180 mg total) by mouth daily.   MONTELUKAST  (SINGULAIR ) 10 MG TABLET    Take 1 tablet (10 mg total) by mouth at bedtime.   PANTOPRAZOLE  (PROTONIX ) 40 MG TABLET    Take 1 tablet (40 mg total) by mouth daily.  Previous Medications   AZELASTINE  (OPTIVAR ) 0.05 % OPHTHALMIC SOLUTION    Place 1 drop into both eyes 2 (two) times daily.   ESTRADIOL  (ESTRACE ) 0.01 % CREA VAGINAL CREAM    PLEASE SEE ATTACHED FOR DETAILED DIRECTIONS   ESTRADIOL -NORETHINDRONE  ACET 0.5-0.1 MG TABLET    Take 1 tablet by mouth daily.   FLUTICASONE  (FLONASE ) 50 MCG/ACT NASAL SPRAY    Place 1 spray into both nostrils 2 (two) times daily.   FREMANEZUMAB -VFRM (AJOVY ) 225 MG/1.5ML SOAJ    Inject 225 mg into the skin every 30 (thirty) days.   GABAPENTIN  (NEURONTIN ) 100 MG CAPSULE    Cand take 100mg  twice daily as needed and up to 300mg  in the evening for symptoms   IPRATROPIUM (ATROVENT ) 0.06 % NASAL  SPRAY    PLACE 2 SPRAYS INTO BOTH NOSTRILS 3 (THREE) TIMES DAILY   ONDANSETRON  (ZOFRAN -ODT) 4 MG DISINTEGRATING TABLET    Take 1-2 tablets (4-8 mg total) by mouth every 8 (eight) hours as needed for nausea.   RIZATRIPTAN  (MAXALT -MLT) 10 MG DISINTEGRATING TABLET    Take 1 tablet (10 mg total) by mouth as needed for migraine. May repeat in 2 hours if needed. Max 2 tablets in 24 hours.   SYNTHROID  75 MCG TABLET    Take 1 tablet (75 mcg total) by mouth daily. on an empty stomach   VALACYCLOVIR  (VALTREX ) 500 MG TABLET    TAKE 1 TABLET TWICE A DAY AS NEEDED  Modified Medications   No medications on file  Discontinued Medications   No medications on file    Physical Exam: Vitals:   03/19/24 1035  BP: 120/78  Pulse: 89  Temp: 98.4  F (36.9 C)  TempSrc: Oral  SpO2: 95%  Weight: 133 lb 12.8 oz (60.7 kg)   Body mass index is 23.7 kg/m. BP Readings from Last 3 Encounters:  03/19/24 120/78  02/25/24 128/68  11/17/23 120/76   Wt Readings from Last 3 Encounters:  03/19/24 133 lb 12.8 oz (60.7 kg)  02/25/24 129 lb 6.4 oz (58.7 kg)  11/17/23 135 lb 9.6 oz (61.5 kg)    Physical Exam Constitutional:      Appearance: Normal appearance.  HENT:     Head: Normocephalic and atraumatic.     Mouth/Throat:     Pharynx: No posterior oropharyngeal erythema.  Cardiovascular:     Rate and Rhythm: Normal rate and regular rhythm.  Pulmonary:     Effort: Pulmonary effort is normal. No respiratory distress.     Breath sounds: Normal breath sounds. No wheezing.  Abdominal:     General: Bowel sounds are normal. There is no distension.     Tenderness: There is no abdominal tenderness. There is no guarding or rebound.     Comments:    Musculoskeletal:        General: No swelling or tenderness.  Skin:    General: Skin is dry.  Neurological:     Mental Status: She is alert. Mental status is at baseline.     Sensory: No sensory deficit.     Motor: No weakness.     Labs reviewed: Basic Metabolic  Panel: No results for input(s): NA, K, CL, CO2, GLUCOSE, BUN, CREATININE, CALCIUM, MG, PHOS, TSH in the last 8760 hours. Liver Function Tests: No results for input(s): AST, ALT, ALKPHOS, BILITOT, PROT, ALBUMIN in the last 8760 hours. No results for input(s): LIPASE, AMYLASE in the last 8760 hours. No results for input(s): AMMONIA in the last 8760 hours. CBC: No results for input(s): WBC, NEUTROABS, HGB, HCT, MCV, PLT in the last 8760 hours. Lipid Panel: No results for input(s): CHOL, HDL, LDLCALC, TRIG, CHOLHDL, LDLDIRECT in the last 8760 hours. TSH: No results for input(s): TSH in the last 8760 hours. A1C: Lab Results  Component Value Date   HGBA1C 5.3 01/18/2022    Assessment & Plan Dry cough Chronic , intermittently since December  Lungs clear Afebrile Will check cbc Orders:   CBC with Differential/Platelet   dextromethorphan-guaiFENesin  (MUCINEX  DM) 30-600 MG 12hr tablet; Take 1 tablet by mouth 2 (two) times daily.  Post-nasal drip C/o post nasal drip Will start singulair  Cont with flonase  Instructed to start taking claritin Orders:   fexofenadine  (ALLEGRA  ALLERGY ) 180 MG tablet; Take 1 tablet (180 mg total) by mouth daily.   montelukast  (SINGULAIR ) 10 MG tablet; Take 1 tablet (10 mg total) by mouth at bedtime.  Gastroesophageal reflux disease, unspecified whether esophagitis present Will do trial of protonix  to see if it helps with cough  Orders:   pantoprazole  (PROTONIX ) 40 MG tablet; Take 1 tablet (40 mg total) by mouth daily.   No follow-ups on file.:   Tremane Spurgeon     [1]  Allergies Allergen Reactions   Augmentin  [Amoxicillin -Pot Clavulanate] Nausea And Vomiting   Cefdinir Hives   Etodolac Other (See Comments)    LODINE   Phenergan  [Promethazine  Hcl]     Restlessness, twitching  [2]  Social History Tobacco Use   Smoking status: Never   Smokeless tobacco: Never  Vaping  Use   Vaping status: Never Used  Substance Use Topics   Alcohol use: Yes    Alcohol/week: 4.0 standard drinks of alcohol  Types: 4 Cans of beer per week    Comment: wine and beer   Drug use: No  [3]  Allergies Allergen Reactions   Augmentin  [Amoxicillin -Pot Clavulanate] Nausea And Vomiting   Cefdinir Hives   Etodolac Other (See Comments)    LODINE   Phenergan  [Promethazine  Hcl]     Restlessness, twitching   "

## 2024-03-19 NOTE — Telephone Encounter (Signed)
 Pt being seen by another provider in office

## 2024-03-23 ENCOUNTER — Ambulatory Visit: Payer: Medicare Other | Admitting: Adult Health

## 2024-03-26 ENCOUNTER — Ambulatory Visit: Admitting: Family Medicine

## 2024-04-01 ENCOUNTER — Ambulatory Visit: Payer: Medicare Other | Admitting: Obstetrics and Gynecology
